# Patient Record
Sex: Female | Born: 1985
Health system: Southern US, Community
[De-identification: ages and names within clinical notes are randomized; demographics above are authoritative.]

## PROBLEM LIST (undated history)

## (undated) ENCOUNTER — Inpatient Hospital Stay (HOSPITAL_COMMUNITY): Payer: Self-pay

## (undated) DIAGNOSIS — M7989 Other specified soft tissue disorders: Secondary | ICD-10-CM

## (undated) DIAGNOSIS — F329 Major depressive disorder, single episode, unspecified: Secondary | ICD-10-CM

## (undated) DIAGNOSIS — F419 Anxiety disorder, unspecified: Secondary | ICD-10-CM

## (undated) DIAGNOSIS — E538 Deficiency of other specified B group vitamins: Secondary | ICD-10-CM

## (undated) DIAGNOSIS — R0602 Shortness of breath: Secondary | ICD-10-CM

## (undated) DIAGNOSIS — B009 Herpesviral infection, unspecified: Secondary | ICD-10-CM

## (undated) DIAGNOSIS — R600 Localized edema: Secondary | ICD-10-CM

## (undated) DIAGNOSIS — M549 Dorsalgia, unspecified: Secondary | ICD-10-CM

## (undated) DIAGNOSIS — D649 Anemia, unspecified: Secondary | ICD-10-CM

## (undated) DIAGNOSIS — E559 Vitamin D deficiency, unspecified: Secondary | ICD-10-CM

## (undated) DIAGNOSIS — G4730 Sleep apnea, unspecified: Secondary | ICD-10-CM

## (undated) DIAGNOSIS — M1990 Unspecified osteoarthritis, unspecified site: Secondary | ICD-10-CM

## (undated) DIAGNOSIS — I10 Essential (primary) hypertension: Secondary | ICD-10-CM

## (undated) DIAGNOSIS — O139 Gestational [pregnancy-induced] hypertension without significant proteinuria, unspecified trimester: Secondary | ICD-10-CM

## (undated) DIAGNOSIS — F988 Other specified behavioral and emotional disorders with onset usually occurring in childhood and adolescence: Secondary | ICD-10-CM

## (undated) DIAGNOSIS — Z332 Encounter for elective termination of pregnancy: Secondary | ICD-10-CM

## (undated) DIAGNOSIS — E669 Obesity, unspecified: Secondary | ICD-10-CM

## (undated) DIAGNOSIS — F32A Depression, unspecified: Secondary | ICD-10-CM

## (undated) DIAGNOSIS — K829 Disease of gallbladder, unspecified: Secondary | ICD-10-CM

## (undated) DIAGNOSIS — N390 Urinary tract infection, site not specified: Secondary | ICD-10-CM

## (undated) DIAGNOSIS — M2550 Pain in unspecified joint: Secondary | ICD-10-CM

## (undated) HISTORY — PX: APPENDECTOMY: SHX54

## (undated) HISTORY — DX: Anxiety disorder, unspecified: F41.9

## (undated) HISTORY — DX: Deficiency of other specified B group vitamins: E53.8

## (undated) HISTORY — DX: Anemia, unspecified: D64.9

## (undated) HISTORY — DX: Other specified behavioral and emotional disorders with onset usually occurring in childhood and adolescence: F98.8

## (undated) HISTORY — DX: Vitamin D deficiency, unspecified: E55.9

## (undated) HISTORY — DX: Depression, unspecified: F32.A

## (undated) HISTORY — DX: Dorsalgia, unspecified: M54.9

## (undated) HISTORY — DX: Localized edema: R60.0

## (undated) HISTORY — DX: Disease of gallbladder, unspecified: K82.9

## (undated) HISTORY — DX: Obesity, unspecified: E66.9

## (undated) HISTORY — DX: Pain in unspecified joint: M25.50

## (undated) HISTORY — PX: OTHER SURGICAL HISTORY: SHX169

## (undated) HISTORY — DX: Other specified soft tissue disorders: M79.89

## (undated) HISTORY — PX: COSMETIC SURGERY: SHX468

## (undated) HISTORY — DX: Shortness of breath: R06.02

## (undated) HISTORY — DX: Gestational (pregnancy-induced) hypertension without significant proteinuria, unspecified trimester: O13.9

## (undated) HISTORY — DX: Unspecified osteoarthritis, unspecified site: M19.90

---
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
## 2007-03-27 ENCOUNTER — Other Ambulatory Visit: Admission: RE | Admit: 2007-03-27 | Discharge: 2007-03-27 | Payer: Self-pay | Admitting: Obstetrics and Gynecology

## 2007-05-13 ENCOUNTER — Ambulatory Visit (HOSPITAL_COMMUNITY): Admission: RE | Admit: 2007-05-13 | Discharge: 2007-05-13 | Payer: Self-pay | Admitting: Obstetrics and Gynecology

## 2007-05-21 ENCOUNTER — Ambulatory Visit (HOSPITAL_COMMUNITY): Admission: RE | Admit: 2007-05-21 | Discharge: 2007-05-21 | Payer: Self-pay | Admitting: Obstetrics and Gynecology

## 2007-07-23 ENCOUNTER — Other Ambulatory Visit: Admission: RE | Admit: 2007-07-23 | Discharge: 2007-07-23 | Payer: Self-pay | Admitting: Obstetrics and Gynecology

## 2007-08-22 ENCOUNTER — Ambulatory Visit (HOSPITAL_COMMUNITY): Admission: RE | Admit: 2007-08-22 | Discharge: 2007-08-22 | Payer: Self-pay | Admitting: Obstetrics and Gynecology

## 2007-09-19 ENCOUNTER — Inpatient Hospital Stay (HOSPITAL_COMMUNITY): Admission: AD | Admit: 2007-09-19 | Discharge: 2007-09-19 | Payer: Self-pay | Admitting: Obstetrics and Gynecology

## 2007-09-25 ENCOUNTER — Inpatient Hospital Stay (HOSPITAL_COMMUNITY): Admission: AD | Admit: 2007-09-25 | Discharge: 2007-09-26 | Payer: Self-pay | Admitting: Obstetrics and Gynecology

## 2007-09-27 ENCOUNTER — Ambulatory Visit (HOSPITAL_COMMUNITY): Admission: RE | Admit: 2007-09-27 | Discharge: 2007-09-27 | Payer: Self-pay | Admitting: Obstetrics and Gynecology

## 2007-10-10 ENCOUNTER — Ambulatory Visit (HOSPITAL_COMMUNITY): Admission: RE | Admit: 2007-10-10 | Discharge: 2007-10-10 | Payer: Self-pay | Admitting: Obstetrics and Gynecology

## 2007-10-18 ENCOUNTER — Inpatient Hospital Stay (HOSPITAL_COMMUNITY): Admission: AD | Admit: 2007-10-18 | Discharge: 2007-10-18 | Payer: Self-pay | Admitting: Obstetrics and Gynecology

## 2007-10-19 ENCOUNTER — Inpatient Hospital Stay (HOSPITAL_COMMUNITY): Admission: AD | Admit: 2007-10-19 | Discharge: 2007-10-19 | Payer: Self-pay | Admitting: Obstetrics and Gynecology

## 2007-10-20 ENCOUNTER — Inpatient Hospital Stay (HOSPITAL_COMMUNITY): Admission: AD | Admit: 2007-10-20 | Discharge: 2007-10-20 | Payer: Self-pay | Admitting: Obstetrics and Gynecology

## 2007-10-21 ENCOUNTER — Inpatient Hospital Stay (HOSPITAL_COMMUNITY): Admission: AD | Admit: 2007-10-21 | Discharge: 2007-10-24 | Payer: Self-pay | Admitting: Obstetrics and Gynecology

## 2010-01-19 ENCOUNTER — Ambulatory Visit (HOSPITAL_COMMUNITY): Admission: RE | Admit: 2010-01-19 | Discharge: 2010-01-19 | Payer: Self-pay | Admitting: Obstetrics and Gynecology

## 2010-02-16 ENCOUNTER — Ambulatory Visit (HOSPITAL_COMMUNITY): Admission: RE | Admit: 2010-02-16 | Discharge: 2010-02-16 | Payer: Self-pay | Admitting: Obstetrics and Gynecology

## 2010-07-28 ENCOUNTER — Inpatient Hospital Stay (HOSPITAL_COMMUNITY): Admission: AD | Admit: 2010-07-28 | Discharge: 2010-07-28 | Payer: Self-pay | Admitting: Obstetrics

## 2010-07-28 ENCOUNTER — Ambulatory Visit: Payer: Self-pay | Admitting: Nurse Practitioner

## 2010-07-28 DIAGNOSIS — O4100X0 Oligohydramnios, unspecified trimester, not applicable or unspecified: Secondary | ICD-10-CM

## 2010-07-30 ENCOUNTER — Inpatient Hospital Stay (HOSPITAL_COMMUNITY): Admission: AD | Admit: 2010-07-30 | Discharge: 2010-07-30 | Payer: Self-pay | Admitting: Obstetrics and Gynecology

## 2010-07-31 ENCOUNTER — Inpatient Hospital Stay (HOSPITAL_COMMUNITY): Admission: AD | Admit: 2010-07-31 | Discharge: 2010-08-03 | Payer: Self-pay | Admitting: Obstetrics and Gynecology

## 2010-08-08 ENCOUNTER — Inpatient Hospital Stay (HOSPITAL_COMMUNITY): Admission: AD | Admit: 2010-08-08 | Discharge: 2010-08-10 | Payer: Self-pay | Admitting: Obstetrics & Gynecology

## 2010-08-09 ENCOUNTER — Encounter (INDEPENDENT_AMBULATORY_CARE_PROVIDER_SITE_OTHER): Payer: Self-pay | Admitting: Obstetrics and Gynecology

## 2010-09-03 ENCOUNTER — Emergency Department (HOSPITAL_COMMUNITY)
Admission: EM | Admit: 2010-09-03 | Discharge: 2010-09-03 | Payer: Self-pay | Source: Home / Self Care | Admitting: Emergency Medicine

## 2010-10-23 ENCOUNTER — Encounter: Payer: Self-pay | Admitting: Obstetrics and Gynecology

## 2010-12-13 LAB — DIFFERENTIAL
Basophils Absolute: 0 10*3/uL (ref 0.0–0.1)
Basophils Absolute: 0 10*3/uL (ref 0.0–0.1)
Basophils Relative: 0 % (ref 0–1)
Basophils Relative: 0 % (ref 0–1)
Eosinophils Absolute: 0.2 10*3/uL (ref 0.0–0.7)
Eosinophils Absolute: 0.3 10*3/uL (ref 0.0–0.7)
Eosinophils Relative: 2 % (ref 0–5)
Eosinophils Relative: 3 % (ref 0–5)
Lymphocytes Relative: 22 % (ref 12–46)
Lymphs Abs: 2.6 10*3/uL (ref 0.7–4.0)
Monocytes Absolute: 0.9 10*3/uL (ref 0.1–1.0)
Monocytes Absolute: 0.9 10*3/uL (ref 0.1–1.0)
Monocytes Relative: 8 % (ref 3–12)
Monocytes Relative: 9 % (ref 3–12)
Neutro Abs: 8 10*3/uL — ABNORMAL HIGH (ref 1.7–7.7)
Neutro Abs: 6.8 10*3/uL (ref 1.7–7.7)
Neutrophils Relative %: 68 % (ref 43–77)

## 2010-12-13 LAB — CBC
HCT: 39.9 % (ref 36.0–46.0)
HCT: 32 % — ABNORMAL LOW (ref 36.0–46.0)
HCT: 34.8 % — ABNORMAL LOW (ref 36.0–46.0)
Hemoglobin: 12.8 g/dL (ref 12.0–15.0)
Hemoglobin: 11 g/dL — ABNORMAL LOW (ref 12.0–15.0)
MCH: 29.7 pg (ref 26.0–34.0)
MCH: 30.5 pg (ref 26.0–34.0)
MCH: 31.9 pg (ref 26.0–34.0)
MCHC: 32.1 g/dL (ref 30.0–36.0)
MCHC: 32.8 g/dL (ref 30.0–36.0)
MCHC: 34.5 g/dL (ref 30.0–36.0)
MCV: 92.6 fL (ref 78.0–100.0)
MCV: 92.4 fL (ref 78.0–100.0)
MCV: 92.5 fL (ref 78.0–100.0)
Platelets: 293 10*3/uL (ref 150–400)
Platelets: 246 10*3/uL (ref 150–400)
Platelets: 330 10*3/uL (ref 150–400)
Platelets: 364 10*3/uL (ref 150–400)
RBC: 4.31 MIL/uL (ref 3.87–5.11)
RBC: 3.46 MIL/uL — ABNORMAL LOW (ref 3.87–5.11)
RBC: 3.76 MIL/uL — ABNORMAL LOW (ref 3.87–5.11)
RDW: 15.2 % (ref 11.5–15.5)
RDW: 14.4 % (ref 11.5–15.5)
RDW: 14.9 % (ref 11.5–15.5)
WBC: 11.8 10*3/uL — ABNORMAL HIGH (ref 4.0–10.5)
WBC: 10 10*3/uL (ref 4.0–10.5)
WBC: 16.2 10*3/uL — ABNORMAL HIGH (ref 4.0–10.5)

## 2010-12-13 LAB — COMPREHENSIVE METABOLIC PANEL
ALT: 39 U/L — ABNORMAL HIGH (ref 0–35)
ALT: 35 U/L (ref 0–35)
AST: 52 U/L — ABNORMAL HIGH (ref 0–37)
AST: 24 U/L (ref 0–37)
Albumin: 3.5 g/dL (ref 3.5–5.2)
Albumin: 2.8 g/dL — ABNORMAL LOW (ref 3.5–5.2)
Albumin: 2.9 g/dL — ABNORMAL LOW (ref 3.5–5.2)
Alkaline Phosphatase: 86 U/L (ref 39–117)
Alkaline Phosphatase: 86 U/L (ref 39–117)
BUN: 9 mg/dL (ref 6–23)
BUN: 11 mg/dL (ref 6–23)
BUN: 9 mg/dL (ref 6–23)
CO2: 27 mEq/L (ref 19–32)
Calcium: 8.8 mg/dL (ref 8.4–10.5)
Calcium: 8.7 mg/dL (ref 8.4–10.5)
Chloride: 105 mEq/L (ref 96–112)
Chloride: 107 mEq/L (ref 96–112)
Creatinine, Ser: 0.84 mg/dL (ref 0.4–1.2)
Creatinine, Ser: 0.66 mg/dL (ref 0.4–1.2)
GFR calc Af Amer: >60 mL/min (ref >60)
GFR calc Af Amer: >60 mL/min (ref >60)
GFR calc non Af Amer: >60 mL/min (ref >60)
Glucose, Bld: 80 mg/dL (ref 70–99)
Potassium: 4.7 mEq/L (ref 3.5–5.1)
Potassium: 4.2 mEq/L (ref 3.5–5.1)
Sodium: 137 mEq/L (ref 135–145)
Sodium: 136 mEq/L (ref 135–145)
Total Bilirubin: 0.3 mg/dL (ref 0.3–1.2)
Total Bilirubin: 0.4 mg/dL (ref 0.3–1.2)
Total Protein: 7.4 g/dL (ref 6.0–8.3)
Total Protein: 6.8 g/dL (ref 6.0–8.3)

## 2010-12-13 LAB — URINALYSIS, ROUTINE W REFLEX MICROSCOPIC
Bilirubin Urine: NEGATIVE
Glucose, UA: NEGATIVE mg/dL
Hgb urine dipstick: NEGATIVE
Ketones, ur: NEGATIVE mg/dL
Nitrite: NEGATIVE
Protein, ur: NEGATIVE mg/dL
Specific Gravity, Urine: 1.012 (ref 1.005–1.030)
Urobilinogen, UA: 0.2 mg/dL (ref 0.0–1.0)
pH: 8 (ref 5.0–8.0)

## 2010-12-13 LAB — POCT I-STAT, CHEM 8
BUN: 10 mg/dL (ref 6–23)
Calcium, Ion: 1.09 mmol/L — ABNORMAL LOW (ref 1.12–1.32)
Chloride: 107 mEq/L (ref 96–112)
Creatinine, Ser: 0.9 mg/dL (ref 0.4–1.2)
Glucose, Bld: 80 mg/dL (ref 70–99)
HCT: 41 % (ref 36.0–46.0)
Hemoglobin: 13.9 g/dL (ref 12.0–15.0)
Potassium: 4.8 mEq/L (ref 3.5–5.1)
Sodium: 138 mEq/L (ref 135–145)
TCO2: 27 mmol/L (ref 0–100)

## 2010-12-13 LAB — BASIC METABOLIC PANEL
BUN: 9 mg/dL (ref 6–23)
CO2: 25 mEq/L (ref 19–32)
Calcium: 8.2 mg/dL — ABNORMAL LOW (ref 8.4–10.5)
Chloride: 104 mEq/L (ref 96–112)
Creatinine, Ser: 0.68 mg/dL (ref 0.4–1.2)
GFR calc Af Amer: >60 mL/min (ref >60)
Glucose, Bld: 84 mg/dL (ref 70–99)

## 2010-12-13 LAB — LIPASE, BLOOD: Lipase: 23 U/L (ref 11–59)

## 2010-12-13 LAB — MRSA PCR SCREENING: MRSA by PCR: NEGATIVE

## 2010-12-14 LAB — CBC
HCT: 35.4 % — ABNORMAL LOW (ref 36.0–46.0)
MCH: 31.1 pg (ref 26.0–34.0)
MCHC: 34 g/dL (ref 30.0–36.0)
MCV: 91.5 fL (ref 78.0–100.0)
Platelets: 285 10*3/uL (ref 150–400)
RDW: 14.7 % (ref 11.5–15.5)
WBC: 12.1 10*3/uL — ABNORMAL HIGH (ref 4.0–10.5)

## 2011-06-22 LAB — CBC
HCT: 32.8 — ABNORMAL LOW
HCT: 35.7 — ABNORMAL LOW
Hemoglobin: 11.4 — ABNORMAL LOW
MCHC: 34.1
MCHC: 34.6
MCHC: 34.8
MCV: 92
MCV: 92.2
Platelets: 252
Platelets: 270
RBC: 3.87
RDW: 14.9
RDW: 15.1
WBC: 14.5 — ABNORMAL HIGH

## 2011-06-22 LAB — URINALYSIS, ROUTINE W REFLEX MICROSCOPIC
Glucose, UA: NEGATIVE
Hgb urine dipstick: NEGATIVE
Ketones, ur: 15 — AB
Leukocytes, UA: NEGATIVE
Protein, ur: 30 — AB
pH: 6

## 2011-06-22 LAB — COMPREHENSIVE METABOLIC PANEL
ALT: 13
AST: 17
Albumin: 2.6 — ABNORMAL LOW
Calcium: 8.9
Chloride: 105
Creatinine, Ser: 0.68
GFR calc Af Amer: >60
Sodium: 133 — ABNORMAL LOW
Total Bilirubin: 0.5

## 2011-06-22 LAB — URINE MICROSCOPIC-ADD ON

## 2011-07-07 LAB — URINALYSIS, ROUTINE W REFLEX MICROSCOPIC
Hgb urine dipstick: NEGATIVE
Nitrite: NEGATIVE
Protein, ur: NEGATIVE
Urobilinogen, UA: 0.2

## 2011-07-07 LAB — URINE MICROSCOPIC-ADD ON

## 2011-07-07 LAB — COMPREHENSIVE METABOLIC PANEL
ALT: 16
Calcium: 8.8
Creatinine, Ser: 0.65
GFR calc non Af Amer: >60
Glucose, Bld: 101 — ABNORMAL HIGH
Sodium: 135
Total Protein: 6.1

## 2011-07-07 LAB — CBC
Hemoglobin: 11.5 — ABNORMAL LOW
MCHC: 34.5
MCV: 92.2
RDW: 14.5

## 2011-07-07 LAB — LACTATE DEHYDROGENASE: LDH: 113

## 2011-07-07 LAB — URIC ACID: Uric Acid, Serum: 4

## 2011-09-23 ENCOUNTER — Emergency Department (HOSPITAL_COMMUNITY): Admission: EM | Admit: 2011-09-23 | Discharge: 2011-09-23 | Payer: Self-pay

## 2013-04-23 ENCOUNTER — Ambulatory Visit: Payer: Self-pay | Admitting: Family Medicine

## 2013-04-23 VITALS — BP 124/70 | HR 88 | Temp 98.2°F | Resp 16 | Ht 71.0 in | Wt 352.0 lb

## 2013-04-23 DIAGNOSIS — N898 Other specified noninflammatory disorders of vagina: Secondary | ICD-10-CM

## 2013-04-23 DIAGNOSIS — Z202 Contact with and (suspected) exposure to infections with a predominantly sexual mode of transmission: Secondary | ICD-10-CM

## 2013-04-23 LAB — POCT URINALYSIS DIPSTICK
Ketones, UA: NEGATIVE
Leukocytes, UA: NEGATIVE
Nitrite, UA: NEGATIVE
Protein, UA: NEGATIVE
Urobilinogen, UA: 0.2
pH, UA: 5.5

## 2013-04-23 LAB — POCT WET PREP WITH KOH
KOH Prep POC: NEGATIVE
Trichomonas, UA: NEGATIVE

## 2013-04-23 LAB — POCT URINE PREGNANCY: Preg Test, Ur: NEGATIVE

## 2013-04-23 MED ORDER — METRONIDAZOLE 500 MG PO TABS: 500.0000 mg | ORAL_TABLET | Freq: Two times a day (BID) | ORAL | Status: DC

## 2013-04-23 MED ORDER — CEFIXIME 400 MG PO TABS: 400.0000 mg | ORAL_TABLET | Freq: Once | ORAL | Status: DC

## 2013-04-23 MED ORDER — AZITHROMYCIN 500 MG PO TABS: 1000.0000 mg | ORAL_TABLET | Freq: Once | ORAL | Status: DC

## 2013-04-23 NOTE — Progress Notes (Signed)
Subjective:    Patient ID: Carrie Shaw, female    DOB: 15-Dec-1985, 27 y.o.   MRN: [id number]   Chief Complaint  Patient presents with  . Abdominal Pain    * 3 days (period was late-took morning after pill 2 weeks ago)  . vaginal odor   HPI  Has been having stomach cramps - feels like uterine contractions during labor (but of course more mild -  4/10 on pain scale) - for the past 2d.  Also has a white, thin discharge with fishy odor.  Has tried rePhresh and douched to get rid of odor but w/o success.  Had unprotected sex on 6/28 and took the morning pill about 12 hrs after - the next morning.  Usually periods comes on the 2nd of each mo - period is always very regular. However, her period did not come on 7/2 as expected but actually came around the 7th - 5d later than normal.  Then took the pregnancy test around 7/12-13th which was negative.    Has had BV in the past - which frequently happens after unprotected sex.  Is in a monogamous relationship.  Requests a rx for the "new medicine" for weight loss - the "one on all the commercials."  Has two kids. last yr she had an abortion. History reviewed. No pertinent past medical history. No current outpatient prescriptions on file prior to visit.   No current facility-administered medications on file prior to visit.   No Known Allergies  Review of Systems  Constitutional: Positive for unexpected weight change. Negative for fever, chills, diaphoresis, activity change, appetite change and fatigue.  Gastrointestinal: Positive for abdominal pain. Negative for nausea, vomiting, blood in stool and anal bleeding.  Genitourinary: Positive for frequency, vaginal discharge, menstrual problem, pelvic pain and dyspareunia. Negative for dysuria, urgency, hematuria, decreased urine volume, vaginal bleeding, difficulty urinating, genital sores and vaginal pain.  Skin: Negative for rash.  Hematological: Negative for adenopathy.      BP 124/70   Pulse 88  Temp(Src) 98.2 F (36.8 C) (Oral)  Resp 16  Ht 5\' 11"  (1.803 m)  Wt 352 lb (159.666 kg)  BMI 49.12 kg/m2  SpO2 99%  LMP 04/09/2013  Objective:   Physical Exam  Constitutional: She is oriented to person, place, and time. She appears well-developed and well-nourished. No distress.  HENT:  Head: Normocephalic and atraumatic.  Cardiovascular: Intact distal pulses.   Pulmonary/Chest: Effort normal.  Abdominal: Soft. Bowel sounds are normal. She exhibits no distension. There is no tenderness. There is no rebound and no guarding.  Genitourinary: Pelvic exam was performed with patient supine. There is no rash, tenderness or lesion on the right labia. There is no rash, tenderness or lesion on the left labia. Uterus is tender. Cervix exhibits no motion tenderness, no discharge and no friability. Right adnexum displays no mass, no tenderness and no fullness. Left adnexum displays no mass, no tenderness and no fullness. No erythema or tenderness around the vagina. Vaginal discharge found.  Small amount of clear thin discharge and a little white clumpy discharge.  Lymphadenopathy:       Right: No inguinal adenopathy present.       Left: No inguinal adenopathy present.  Neurological: She is alert and oriented to person, place, and time.  Skin: Skin is warm and dry. She is not diaphoretic.  Psychiatric: She has a normal mood and affect. Her behavior is normal.      Results for orders placed in visit on 04/23/13  POCT URINE PREGNANCY      Result Value Range   Preg Test, Ur Negative    POCT URINALYSIS DIPSTICK      Result Value Range   Color, UA yellow     Clarity, UA clear     Glucose, UA neg     Bilirubin, UA neg     Ketones, UA neg     Spec Grav, UA 1.020     Blood, UA neg     pH, UA 5.5     Protein, UA neg     Urobilinogen, UA 0.2     Nitrite, UA neg     Leukocytes, UA Negative    POCT WET PREP WITH KOH      Result Value Range   Trichomonas, UA Negative     Clue Cells Wet  Prep HPF POC 1-2     Epithelial Wet Prep HPF POC 4-8     Yeast Wet Prep HPF POC neg     Bacteria Wet Prep HPF POC 3+     RBC Wet Prep HPF POC neg     WBC Wet Prep HPF POC 0-5     KOH Prep POC Negative      Assessment & Plan:  Vaginal discharge - Plan: POCT urine pregnancy, POCT urinalysis dipstick, POCT Wet Prep with KOH, GC/Chlamydia Probe Amp.  Pt does have amine odor of BV - wet prep may be falsely negative due to recent use of rePhresh and douching. Due to recurrent sxs and odor will go ahead and have pt start trx for BV w/ flagyl tonight - hopefully this will help uterine pain.  Possible exposure to STD - Plan: GC/Chlamydia Probe Amp Pt does not have health insurance so offered to treat emperically for GC/Chl but pt definitely wants to know if she does have these infections since she believes she is in a monogamous relationship.  Printed rx for cefixime and azithro but pt will not fill until tests come back unless suprapubic cramping pain worsens - then go ahead and start.  Morbid obesity - risks and poor results of most "weight loss" meds reviewed. Rec diet and exercise. Meds ordered this encounter  Medications  . azithromycin (ZITHROMAX) 500 MG tablet    Sig: Take 2 tablets (1,000 mg total) by mouth once.    Dispense:  2 tablet    Refill:  0  . cefixime (SUPRAX) 400 MG tablet    Sig: Take 1 tablet (400 mg total) by mouth once.    Dispense:  1 tablet    Refill:  0  . metroNIDAZOLE (FLAGYL) 500 MG tablet    Sig: Take 1 tablet (500 mg total) by mouth 2 (two) times daily with a meal. DO NOT CONSUME ALCOHOL WHILE TAKING THIS MEDICATION.    Dispense:  14 tablet    Refill:  0

## 2013-04-23 NOTE — Patient Instructions (Addendum)
Make an appointment at either the [facility] Dept or the [facility] and [facility] for full STD testing ASAP!!!!

## 2013-04-25 LAB — GC/CHLAMYDIA PROBE AMP: CT Probe RNA: NEGATIVE

## 2013-04-28 ENCOUNTER — Telehealth: Payer: Self-pay

## 2013-04-28 NOTE — Telephone Encounter (Signed)
Patient is calling about lab results call [phone]

## 2013-04-28 NOTE — Telephone Encounter (Signed)
Called patient to advise on lab results.    Value   CT Probe RNA NEGATIVE    GC Probe RNA NEGATIVE     Left message for her to call back

## 2013-04-28 NOTE — Telephone Encounter (Signed)
Patient advised.

## 2013-05-06 ENCOUNTER — Encounter: Payer: Self-pay | Admitting: Family Medicine

## 2013-08-31 ENCOUNTER — Telehealth: Payer: Self-pay

## 2013-08-31 MED ORDER — METRONIDAZOLE 500 MG PO TABS: 500.0000 mg | ORAL_TABLET | Freq: Two times a day (BID) | ORAL | Status: DC

## 2013-08-31 NOTE — Telephone Encounter (Signed)
Patient would like to know if we can rx same medication she received last time for vaginal odor. She does not have insurance and cannot afford to come in and she is having the same symptoms as last time.

## 2013-08-31 NOTE — Telephone Encounter (Signed)
I sent the flagyl to the pharmacy but if this reoccurs she must have an OV because at her last visit she did not have a definitive infection.

## 2013-09-01 NOTE — Telephone Encounter (Signed)
Called her, to advise. Left message to advise Rx sent return if not better.

## 2013-10-16 ENCOUNTER — Ambulatory Visit: Payer: No Typology Code available for payment source | Admitting: Emergency Medicine

## 2013-10-16 VITALS — BP 124/80 | HR 61 | Temp 98.0°F | Resp 16 | Ht 71.5 in | Wt 356.0 lb

## 2013-10-16 DIAGNOSIS — Z23 Encounter for immunization: Secondary | ICD-10-CM

## 2013-10-16 NOTE — Progress Notes (Signed)
[facility] [address], [place] [address] [phone]  Date:  10/16/2013   Name:  Carrie Shaw   DOB:  05/26/1986   MRN:  [id number]  PCP:  No PCP Per Patient    Chief Complaint: Immunizations   History of Present Illness:  Carrie Shaw is a 28 y.o. very pleasant female patient who presents with the following:  For school required tetanus booster.  Denies other complaint or health concern today.   Patient Active Problem List   Diagnosis Date Noted  . Morbid obesity 04/23/2013    History reviewed. No pertinent past medical history.  History reviewed. No pertinent past surgical history.  History  Substance Use Topics  . Smoking status: Current Every Day Smoker -- 5.00 packs/day for 10 years    Types: Cigarettes  . Smokeless tobacco: Not on file  . Alcohol Use: Yes    Family History  Problem Relation Age of Onset  . Schizophrenia Sister     No Known Allergies  Medication list has been reviewed and updated.  No current outpatient prescriptions on file prior to visit.   No current facility-administered medications on file prior to visit.    Review of Systems:  As per HPI, otherwise negative.     Physical Examination: Filed Vitals:   10/16/13 0844  BP: 124/80  Pulse: 61  Temp: 98 F (36.7 C)  Resp: 16   Filed Vitals:   10/16/13 0844  Height: 5' 11.5" (1.816 m)  Weight: 356 lb (161.481 kg)   Body mass index is 48.97 kg/(m^2). Ideal Body Weight: Weight in (lb) to have BMI = 25: 181.4   GEN: WDWN, NAD, Non-toxic, Alert & Oriented x 3 HEENT: Atraumatic, Normocephalic.  Ears and Nose: No external deformity. EXTR: No clubbing/cyanosis/edema NEURO: Normal gait.  PSYCH: Normally interactive. Conversant. Not depressed or anxious appearing.  Calm demeanor.    Assessment and Plan: TDAP  Signed,  Phillips OdorJeffery Yoanna Jurczyk, MD

## 2013-11-01 ENCOUNTER — Ambulatory Visit: Payer: No Typology Code available for payment source | Admitting: Family Medicine

## 2013-11-01 VITALS — BP 140/80 | HR 82 | Temp 98.1°F | Resp 16 | Ht 71.0 in | Wt 347.2 lb

## 2013-11-01 DIAGNOSIS — N912 Amenorrhea, unspecified: Secondary | ICD-10-CM

## 2013-11-01 LAB — POCT URINE PREGNANCY: Preg Test, Ur: NEGATIVE

## 2013-11-01 NOTE — Progress Notes (Signed)
° °  Subjective:    Patient ID: Carrie Shaw, female    DOB: 05/18/1986, 28 y.o.   MRN: [id number]  HPI This chart was scribed for Carrie SidleKurt Lauenstein, MD by Andrew Auaven Small, ED Scribe. This patient was seen in room 5 and the patient's care was started at 7:02 PM.  HPI Comments: Carrie Shaw is a 28 y.o. female who [facility] a pregnancy test. Pt states that she has missed her period and that her last regular period was 1 month ago. She states that she has never skipped a period. She states she is afraid. She reports that she doesn't not want to be pregnant. She reports that early this month she has had breast tenderness.   Pt works for [employer]  No past medical history on file. No Known Allergies Prior to Admission medications   Not on File    Review of Systems  Constitutional: Negative for fever and chills.  Gastrointestinal: Negative for nausea.       Objective:   Physical Exam  Nursing note and vitals reviewed. Constitutional: She is oriented to person, place, and time. She appears well-developed and well-nourished. No distress.  HENT:  Head: Normocephalic and atraumatic.  Eyes: EOM are normal.  Neck: Neck supple. No tracheal deviation present.  Cardiovascular: Normal rate.   Pulmonary/Chest: Effort normal. No respiratory distress.  Musculoskeletal: Normal range of motion.  Neurological: She is alert and oriented to person, place, and time.  Skin: Skin is warm and dry.  Psychiatric: She has a normal mood and affect. Her behavior is normal.   Filed Vitals:   11/01/13 1833  BP: 140/80  Pulse: 82  Temp: 98.1 F (36.7 C)  Resp: 16   Results for orders placed in visit on 11/01/13  POCT URINE PREGNANCY      Result Value Range   Preg Test, Ur Negative        Assessment & Plan:   Amenorrhea - Plan: POCT urine pregnancy, hCG, serum, qualitative  Signed, Carrie SidleKurt Lauenstein, MD

## 2013-11-02 ENCOUNTER — Telehealth: Payer: Self-pay

## 2013-11-02 LAB — HCG, SERUM, QUALITATIVE: Preg, Serum: NEGATIVE

## 2013-11-02 NOTE — Telephone Encounter (Signed)
They are not back yet. It looks like Carrie Shaw told her that we would call her when we got them back

## 2013-11-02 NOTE — Telephone Encounter (Signed)
Patient is calling to get her pregnancy lab results. Please advise. I told her they were not ready yet since it was collected after 7 pm last night.  I told her our lab would call when ready.  Best: [phone]

## 2013-11-03 ENCOUNTER — Other Ambulatory Visit: Payer: Self-pay | Admitting: Family Medicine

## 2013-11-03 ENCOUNTER — Telehealth: Payer: Self-pay

## 2013-11-03 DIAGNOSIS — N912 Amenorrhea, unspecified: Secondary | ICD-10-CM

## 2013-11-03 MED ORDER — MEDROXYPROGESTERONE ACETATE 10 MG PO TABS: 10.0000 mg | ORAL_TABLET | Freq: Every day | ORAL | Status: DC

## 2013-11-03 NOTE — Telephone Encounter (Signed)
Patient called about labs and was advised.  Patient states Dr. Milus GlazierLauenstein was going to give her Rx for her cycle to start.

## 2013-11-03 NOTE — Telephone Encounter (Signed)
Provera 10 mg qhs x 10 days has been called in.  Expect period 7 days after completing the medication

## 2013-11-04 NOTE — Telephone Encounter (Signed)
Patient notified and voiced understanding.

## 2014-01-09 ENCOUNTER — Ambulatory Visit (INDEPENDENT_AMBULATORY_CARE_PROVIDER_SITE_OTHER): Payer: No Typology Code available for payment source | Admitting: Family Medicine

## 2014-01-09 VITALS — BP 100/78 | HR 77 | Temp 98.3°F | Resp 16 | Ht 71.0 in | Wt 354.0 lb

## 2014-01-09 DIAGNOSIS — N949 Unspecified condition associated with female genital organs and menstrual cycle: Secondary | ICD-10-CM

## 2014-01-09 DIAGNOSIS — B9689 Other specified bacterial agents as the cause of diseases classified elsewhere: Secondary | ICD-10-CM

## 2014-01-09 DIAGNOSIS — N898 Other specified noninflammatory disorders of vagina: Secondary | ICD-10-CM

## 2014-01-09 DIAGNOSIS — N760 Acute vaginitis: Secondary | ICD-10-CM

## 2014-01-09 LAB — POCT WET PREP WITH KOH
KOH Prep POC: NEGATIVE
RBC Wet Prep HPF POC: NEGATIVE
TRICHOMONAS UA: NEGATIVE
YEAST WET PREP PER HPF POC: NEGATIVE

## 2014-01-09 MED ORDER — METRONIDAZOLE 500 MG PO TABS: 500.0000 mg | ORAL_TABLET | Freq: Two times a day (BID) | ORAL | Status: DC

## 2014-01-09 NOTE — Patient Instructions (Signed)
We are going to treat you with flagyl for bacterial vaginosis.  This is a bacterial imbalance and is probably causing your odor. Do not drink any alcohol with this medication!  Also, I would suggest that you try non- latex condoms in case you are sensitive to this material.   Let me know if you are not better, I will follow-up with the rest of your labs

## 2014-01-09 NOTE — Progress Notes (Addendum)
[facility] [address] [place] [address] [phone]  Date:  01/09/2014   Name:  Carrie Shaw   DOB:  23-Aug-1986   MRN:  [id number]  PCP:  No PCP Per Patient    Chief Complaint: vaginal odor   History of Present Illness:  Carrie Shaw is a 28 y.o. very pleasant female patient who presents with the following:  Here today with vaginal odor. She has one regular partner and does not use condoms with this person.  She has another partner who she sees on occasion and she does use condoms then.  She had sex with this other person recently and did use a condom.  Often after these encounters she will have vaginal sx.  Wonders if she might be allergic to the latex condoms they use.   She notes vaginal discharge and odor. No itching.  No pain.  Otherwise she feels well.  LMP 3 weeks ago.  She does not feel there is any chance of pregnancy.  Generally healthy otherwise except for obesity.   Patient Active Problem List   Diagnosis Date Noted  . Morbid obesity 04/23/2013    History reviewed. No pertinent past medical history.  Past Surgical History  Procedure Laterality Date  . Appendectomy      History  Substance Use Topics  . Smoking status: Current Every Day Smoker -- 5.00 packs/day for 10 years    Types: Cigarettes  . Smokeless tobacco: Not on file  . Alcohol Use: Yes    Family History  Problem Relation Age of Onset  . Schizophrenia Sister     No Known Allergies  Medication list has been reviewed and updated.  Current Outpatient Prescriptions on File Prior to Visit  Medication Sig Dispense Refill  . medroxyPROGESTERone (PROVERA) 10 MG tablet Take 1 tablet (10 mg total) by mouth at bedtime.  10 tablet  0   No current facility-administered medications on file prior to visit.    Review of Systems:  As per HPI- otherwise negative.   Physical Examination: Filed Vitals:   01/09/14 1725  BP: 100/78  Pulse: 77  Temp: 98.3 F  (36.8 C)  Resp: 16   Filed Vitals:   01/09/14 1725  Height: 5\' 11"  (1.803 m)  Weight: 354 lb (160.573 kg)   Body mass index is 49.39 kg/(m^2). Ideal Body Weight: Weight in (lb) to have BMI = 25: 178.9  GEN: WDWN, NAD, Non-toxic, A & O x 3, obese, looks well HEENT: Atraumatic, Normocephalic. Neck supple. No masses, No LAD. Ears and Nose: No external deformity. CV: RRR, No M/G/R. No JVD. No thrill. No extra heart sounds. PULM: CTA B, no wheezes, crackles, rhonchi. No retractions. No resp. distress. No accessory muscle use. ABD: S, NT, ND, +BS. No rebound. No HSM. EXTR: No c/c/e NEURO Normal gait.  PSYCH: Normally interactive. Conversant. Not depressed or anxious appearing.  Calm demeanor.  Gu: normal exam except for thin white discharge.  No CMT, no adnexal tenderness or masses.  No vaginal lesions.    Results for orders placed in visit on 01/09/14  POCT WET PREP WITH KOH      Result Value Ref Range   Trichomonas, UA Negative     Clue Cells Wet Prep HPF POC 50%     Epithelial Wet Prep HPF POC 3-5     Yeast Wet Prep HPF POC neg     Bacteria Wet Prep HPF POC 2+     RBC  Wet Prep HPF POC neg     WBC Wet Prep HPF POC 0-1     KOH Prep POC Negative      Assessment and Plan: Vaginal odor - Plan: POCT Wet Prep with KOH, GC/Chlamydia Probe Amp, metroNIDAZOLE (FLAGYL) 500 MG tablet  Bacterial vaginosis  Treat for BV with flagyl.  She may be sensitive to the latex condoms that she is using.  Suggested that she use non- latex next time.   See patient instructions for more details.   Will plan further follow- up pending labs.   Signed Abbe AmsterdamJessica Copland, MD  4/11: called and LMOM.  genprobe negative Results for orders placed in visit on 01/09/14  GC/CHLAMYDIA PROBE AMP      Result Value Ref Range   CT Probe RNA NEGATIVE     GC Probe RNA NEGATIVE    POCT WET PREP WITH KOH      Result Value Ref Range   Trichomonas, UA Negative     Clue Cells Wet Prep HPF POC 50%     Epithelial  Wet Prep HPF POC 3-5     Yeast Wet Prep HPF POC neg     Bacteria Wet Prep HPF POC 2+     RBC Wet Prep HPF POC neg     WBC Wet Prep HPF POC 0-1     KOH Prep POC Negative

## 2014-01-10 LAB — GC/CHLAMYDIA PROBE AMP
CT PROBE, AMP APTIMA: NEGATIVE
GC Probe RNA: NEGATIVE

## 2014-12-25 ENCOUNTER — Ambulatory Visit (INDEPENDENT_AMBULATORY_CARE_PROVIDER_SITE_OTHER): Payer: BLUE CROSS/BLUE SHIELD | Admitting: Physician Assistant

## 2014-12-25 VITALS — BP 132/86 | HR 83 | Temp 98.1°F | Resp 18 | Ht 71.0 in | Wt 385.0 lb

## 2014-12-25 DIAGNOSIS — N926 Irregular menstruation, unspecified: Secondary | ICD-10-CM | POA: Diagnosis not present

## 2014-12-25 DIAGNOSIS — Z01419 Encounter for gynecological examination (general) (routine) without abnormal findings: Secondary | ICD-10-CM | POA: Diagnosis not present

## 2014-12-25 DIAGNOSIS — Z309 Encounter for contraceptive management, unspecified: Secondary | ICD-10-CM

## 2014-12-25 DIAGNOSIS — R635 Abnormal weight gain: Secondary | ICD-10-CM

## 2014-12-25 DIAGNOSIS — F39 Unspecified mood [affective] disorder: Secondary | ICD-10-CM

## 2014-12-25 DIAGNOSIS — Z13228 Encounter for screening for other metabolic disorders: Secondary | ICD-10-CM

## 2014-12-25 DIAGNOSIS — B373 Candidiasis of vulva and vagina: Secondary | ICD-10-CM | POA: Diagnosis not present

## 2014-12-25 DIAGNOSIS — N898 Other specified noninflammatory disorders of vagina: Secondary | ICD-10-CM | POA: Diagnosis not present

## 2014-12-25 DIAGNOSIS — Z113 Encounter for screening for infections with a predominantly sexual mode of transmission: Secondary | ICD-10-CM

## 2014-12-25 DIAGNOSIS — G4700 Insomnia, unspecified: Secondary | ICD-10-CM | POA: Diagnosis not present

## 2014-12-25 DIAGNOSIS — Z1329 Encounter for screening for other suspected endocrine disorder: Secondary | ICD-10-CM | POA: Diagnosis not present

## 2014-12-25 DIAGNOSIS — B3731 Acute candidiasis of vulva and vagina: Secondary | ICD-10-CM

## 2014-12-25 DIAGNOSIS — R454 Irritability and anger: Secondary | ICD-10-CM

## 2014-12-25 DIAGNOSIS — Z0000 Encounter for general adult medical examination without abnormal findings: Secondary | ICD-10-CM

## 2014-12-25 DIAGNOSIS — R4586 Emotional lability: Secondary | ICD-10-CM

## 2014-12-25 DIAGNOSIS — Z13220 Encounter for screening for lipoid disorders: Secondary | ICD-10-CM | POA: Diagnosis not present

## 2014-12-25 DIAGNOSIS — Z30011 Encounter for initial prescription of contraceptive pills: Secondary | ICD-10-CM

## 2014-12-25 LAB — CBC WITH DIFFERENTIAL/PLATELET
Basophils Absolute: 0 10*3/uL (ref 0.0–0.1)
Basophils Relative: 0 % (ref 0–1)
EOS ABS: 0.1 10*3/uL (ref 0.0–0.7)
EOS PCT: 1 % (ref 0–5)
HEMATOCRIT: 40.3 % (ref 36.0–46.0)
HEMOGLOBIN: 13.2 g/dL (ref 12.0–15.0)
LYMPHS ABS: 2.9 10*3/uL (ref 0.7–4.0)
LYMPHS PCT: 26 % (ref 12–46)
MCH: 29.7 pg (ref 26.0–34.0)
MCHC: 32.8 g/dL (ref 30.0–36.0)
MCV: 90.8 fL (ref 78.0–100.0)
MONO ABS: 0.6 10*3/uL (ref 0.1–1.0)
MONOS PCT: 5 % (ref 3–12)
MPV: 9.9 fL (ref 8.6–12.4)
Neutro Abs: 7.5 10*3/uL (ref 1.7–7.7)
Neutrophils Relative %: 68 % (ref 43–77)
Platelets: 332 10*3/uL (ref 150–400)
RBC: 4.44 MIL/uL (ref 3.87–5.11)
RDW: 14.6 % (ref 11.5–15.5)
WBC: 11 10*3/uL — ABNORMAL HIGH (ref 4.0–10.5)

## 2014-12-25 LAB — POCT URINALYSIS DIPSTICK
Bilirubin, UA: NEGATIVE
Blood, UA: NEGATIVE
Glucose, UA: NEGATIVE
KETONES UA: NEGATIVE
Nitrite, UA: NEGATIVE
PH UA: 5.5
PROTEIN UA: NEGATIVE
SPEC GRAV UA: 1.015
UROBILINOGEN UA: 0.2

## 2014-12-25 LAB — POCT WET PREP WITH KOH
CLUE CELLS WET PREP PER HPF POC: 1.3
KOH Prep POC: POSITIVE
RBC Wet Prep HPF POC: NEGATIVE
TRICHOMONAS UA: NEGATIVE

## 2014-12-25 LAB — COMPLETE METABOLIC PANEL WITH GFR
ALK PHOS: 56 U/L (ref 39–117)
ALT: 36 U/L — AB (ref 0–35)
AST: 44 U/L — AB (ref 0–37)
Albumin: 3.8 g/dL (ref 3.5–5.2)
BUN: 10 mg/dL (ref 6–23)
CALCIUM: 9.2 mg/dL (ref 8.4–10.5)
CHLORIDE: 106 meq/L (ref 96–112)
CO2: 24 mEq/L (ref 19–32)
CREATININE: 0.7 mg/dL (ref 0.50–1.10)
GFR, Est African American: >89 mL/min
GFR, Est Non African American: >89 mL/min
Glucose, Bld: 65 mg/dL — ABNORMAL LOW (ref 70–99)
POTASSIUM: 4.9 meq/L (ref 3.5–5.3)
Sodium: 139 mEq/L (ref 135–145)
Total Bilirubin: 0.4 mg/dL (ref 0.2–1.2)
Total Protein: 7.7 g/dL (ref 6.0–8.3)

## 2014-12-25 LAB — LIPID PANEL
CHOL/HDL RATIO: 3.4 ratio
Cholesterol: 147 mg/dL (ref 0–200)
HDL: 43 mg/dL — AB (ref >=46)
LDL CALC: 93 mg/dL (ref 0–99)
Triglycerides: 57 mg/dL (ref <150)
VLDL: 11 mg/dL (ref 0–40)

## 2014-12-25 LAB — GLUCOSE, POCT (MANUAL RESULT ENTRY): POC Glucose: 82 mg/dl (ref 70–99)

## 2014-12-25 LAB — POCT URINE PREGNANCY: PREG TEST UR: NEGATIVE

## 2014-12-25 LAB — TSH: TSH: 1.317 u[IU]/mL (ref 0.350–4.500)

## 2014-12-25 MED ORDER — LEVONORGESTREL-ETHINYL ESTRAD 0.1-20 MG-MCG PO TABS: 1.0000 | ORAL_TABLET | Freq: Every day | ORAL | Status: DC

## 2014-12-25 MED ORDER — FLUCONAZOLE 150 MG PO TABS
150.0000 mg | ORAL_TABLET | Freq: Once | ORAL | Status: DC
Start: 2014-12-25 — End: 2015-02-12

## 2014-12-25 MED ORDER — TRAZODONE HCL 50 MG PO TABS: 25.0000 mg | ORAL_TABLET | Freq: Every evening | ORAL | Status: DC | PRN

## 2014-12-25 MED ORDER — ESCITALOPRAM OXALATE 10 MG PO TABS: 10.0000 mg | ORAL_TABLET | Freq: Every day | ORAL | Status: DC

## 2014-12-25 NOTE — Progress Notes (Signed)
Subjective:    Patient ID: Carrie Shaw, female    DOB: 24-Feb-1986, 29 y.o.   MRN: [id number]  HPI Pt presents for her CPE and pap as well as several problems. 1- Over the last year she has had depressed mood and increased problems with anger and it has gotten worse over the last 6 months.  She feels like she is always nervous and waiting for something bad to happen.  She feels like it started about the time she and her boyfriend started having problems - since then they have moved from living together to separately and now they are back living together again.  She feels like this is not a long term relationship but she does not think it is going to end soon.  She is not sleeping well at night due to the stress and she is unable to stop thinking when she lays down at night. 2- over the last year she has noticed weight gain - she has been trying to lose weight but she has not noticed the change that she would expect.  She has always been overweight.  Most of her family is overweight.  She started going to the gym about a month ago and she has lost about 5 lbs.  She states she is not a stress eater and in fact when she is stressed she finds that she eats less food.  She does know that her weight gain does make her mood worse.  She is happy with her eating and she does not eat differently only. Diet -- Breakfast - protein shake or boil eggs or yogurt and fruit  snacks - Almonds - fruit only Lunch - salad with meat Dinner - meat and salad Drink- water (no sodas, no tea or fruit) 3- she has been having vaginal discharge and itching over the last 2 days.   4- she is interested in starting on OCP - she has been on them in the past and would like to restart them - she is not interested in LARC methods.  Her current boyfriend does not want more children she would like to have 1 more child. 5- menses are more irregular recently - they have always been very regular and now they are irregular but they are  not more heavy even when they are missed  Last dental visit, pap and vision exam were years ago.  She just recently got insurance.  ETOH - rare Smoke - quit cigs about 1 week ago - 1/2 pack/day Drugs - none  Work - time Physiological scientist - sedentary job  Family - hx HTN, majority of family is overweight G3P2 (children 7 and 4)  Review of Systems  Constitutional: Positive for unexpected weight change (increase). Negative for fever, chills and appetite change.  HENT: Negative.   Eyes: Negative.   Respiratory: Negative.   Cardiovascular: Negative.   Gastrointestinal: Negative.   Endocrine: Negative.   Genitourinary: Positive for vaginal discharge.  Skin: Negative.   Allergic/Immunologic: Negative.   Neurological: Negative.   Hematological: Negative.   Psychiatric/Behavioral: Positive for dysphoric mood. Negative for suicidal ideas and sleep disturbance. The patient is nervous/anxious.       Objective:   Physical Exam  Constitutional: She is oriented to person, place, and time. She appears well-developed and well-nourished.  BP 132/86 mmHg  Pulse 83  Temp(Src) 98.1 F (36.7 C)  Resp 18  Ht  (1.803 m)  Wt 385 lb (174.635 kg)  BMI 53.72 kg/m2  SpO2 98%  LMP 12/01/2014   HENT:  Head: Normocephalic and atraumatic.  Right Ear: External ear normal.  Left Ear: External ear normal.  Eyes: Conjunctivae and EOM are normal. Pupils are equal, round, and reactive to light.  Neck: Normal range of motion.  Cardiovascular: Normal rate, regular rhythm and normal heart sounds.   No murmur heard. Pulmonary/Chest: Effort normal and breath sounds normal. She has no wheezes.  Abdominal: Soft. Bowel sounds are normal.  Genitourinary: Uterus normal. Pelvic exam was performed with patient supine. There is no rash, tenderness, lesion or injury on the right labia. There is no rash, tenderness, lesion or injury on the left labia. Cervix exhibits no motion tenderness, no discharge and no  friability. Right adnexum displays no mass, no tenderness and no fullness. Left adnexum displays no mass, no tenderness and no fullness. No signs of injury around the vagina. Vaginal discharge (thick white discharge) found.  Lymphadenopathy:    She has no cervical adenopathy.  Neurological: She is alert and oriented to person, place, and time.  Skin: Skin is warm and dry.  Psychiatric: She has a normal mood and affect. Her behavior is normal. Judgment and thought content normal.   Results for orders placed or performed in visit on 12/25/14  POCT urine pregnancy  Result Value Ref Range   Preg Test, Ur Negative   POCT glucose (manual entry)  Result Value Ref Range   POC Glucose 82 70 - 99 mg/dl  POCT urinalysis dipstick  Result Value Ref Range   Color, UA yellow    Clarity, UA clear    Glucose, UA neg    Bilirubin, UA neg    Ketones, UA neg    Spec Grav, UA 1.015    Blood, UA neg    pH, UA 5.5    Protein, UA neg    Urobilinogen, UA 0.2    Nitrite, UA neg    Leukocytes, UA small (1+)   POCT Wet Prep with KOH  Result Value Ref Range   Trichomonas, UA Negative    Clue Cells Wet Prep HPF POC 1.3    Epithelial Wet Prep HPF POC 10-15    Yeast Wet Prep HPF POC present    Bacteria Wet Prep HPF POC 3+    RBC Wet Prep HPF POC neg    WBC Wet Prep HPF POC 10-15    KOH Prep POC Positive        Assessment & Plan:  Annual physical exam - Plan: CBC with Differential/Platelet, POCT urinalysis dipstick  Weight gain - Plan: TSH, FSH/LH, POCT glucose (manual entry), Ambulatory referral to diabetic education, pt to continue her exercise pattern that she started a week ago  Screening for thyroid disorder - Plan: TSH  Screening cholesterol level - Plan: Lipid panel  Abnormal menses -start OCP and check of PCOS though less likely because she has always been regular until she had increase stress at home  Screening for metabolic disorder - Plan: COMPLETE METABOLIC PANEL WITH GFR  Mood  swings - Pt suggested to seek counseling to help her deal with her current sitution.  Plan: escitalopram (LEXAPRO) 10 MG tablet  Irritability - Plan: escitalopram (LEXAPRO) 10 MG tablet  Insomnia - Plan: traZODone (DESYREL) 50 MG tablet, escitalopram (LEXAPRO) 10 MG tablet  Morbid obesity  Encounter for routine gynecological examination - Plan: Pap IG, CT/NG w/ reflex HPV when ASC-U  Vaginal discharge - Plan: POCT Wet Prep with KOH, Hepatitis C Ab Reflex HCV RNA, QUANT,  Hepatitis B surface antigen, HIV antibody, RPR  Irregular menses - Plan: TSH, FSH/LH, POCT urine pregnancy  OCP (oral contraceptive pills) initiation - Plan: levonorgestrel-ethinyl estradiol (AVIANE) 0.1-20 MG-MCG tablet  Screen for STD (sexually transmitted disease) - Plan: Hepatitis C Ab Reflex HCV RNA, QUANT, Hepatitis B surface antigen, HIV antibody, RPR  Yeast vaginitis - Plan: fluconazole (DIFLUCAN) 150 MG tablet  Benny Lennert PA-C  [facility] 12/25/2014 1:05 PM

## 2014-12-25 NOTE — Patient Instructions (Addendum)
I will contact you with your lab results as soon as they are available.   If you have not heard from me in 2 weeks, please contact me.  The fastest way to get your results is to register for My Chart (see the instructions on the last page of this printout).  For therapy -- [facility] ([address]Beth Coralie CommonKincaid, Ernest McCoy, Heather Joycelyn SchmidKitchens, Karla Garfieldownsend) - [phone] [facility](Julie Alamo[place]) - [phone] [facility] - [phone] [facility]  - [phone] (do not file insurance)   For your trazodone - start with 1/2 a pill at night and then increase every 4 days until either you are sleeping or you are having side effects - stop going up on the medications when either of these happen  I will see you in a month

## 2014-12-26 LAB — HEPATITIS C ANTIBODY: HCV Ab: NEGATIVE

## 2014-12-26 LAB — FSH/LH
FSH: 9.2 m[IU]/mL
LH: 7.2 m[IU]/mL

## 2014-12-26 LAB — HEPATITIS B SURFACE ANTIGEN: HEP B S AG: NEGATIVE

## 2014-12-26 LAB — HIV ANTIBODY (ROUTINE TESTING W REFLEX): HIV: NONREACTIVE

## 2014-12-26 LAB — RPR

## 2014-12-29 LAB — PAP IG, CT-NG, RFX HPV ASCU
Chlamydia Probe Amp: NEGATIVE
GC Probe Amp: NEGATIVE

## 2015-01-07 ENCOUNTER — Encounter: Payer: Self-pay | Admitting: Dietician

## 2015-01-07 ENCOUNTER — Encounter: Payer: BLUE CROSS/BLUE SHIELD | Attending: Physician Assistant | Admitting: Dietician

## 2015-01-07 DIAGNOSIS — Z6841 Body Mass Index (BMI) 40.0 and over, adult: Secondary | ICD-10-CM | POA: Insufficient documentation

## 2015-01-07 DIAGNOSIS — Z713 Dietary counseling and surveillance: Secondary | ICD-10-CM | POA: Insufficient documentation

## 2015-01-07 NOTE — Patient Instructions (Addendum)
Continue level of exercise and think about adding another day. Try to get at least 7 hours of sleep. For breakfast, have 2-3 boiled egg with fruit, toast, sandwich thin, or oatmeal (sweeten with splenda).  For snacks have protein with carbs -  Yogurt, nature valley protein bar or popcorn/rice cakes/cracker/fruit with cheese or nuts.  For lunch and dinner, have protein the size of the palm of your hand, half your plate vegetables, and a quarter of your plate a starch or fruit. Add a carb to dinner. Try spaghetti squash. Think about using a small plate to help with portions. Try to eat meals at a table and with no distractions (TV, phone, computer). Take 20 minutes to eat. Chew well (20 x per bite), put your fork down between bites, take sips of your drink. Have seconds if you are still hungry after 20 minutes.

## 2015-01-07 NOTE — Progress Notes (Signed)
  Medical Nutrition Therapy:  Appt start time: 1100 end time:  1140.   Assessment:  Primary concerns today: Carrie Shaw is here today since she continue to gain weight and it is most likely d/t stress. In the past year she has gained 30 lbs and 7 years she has gained about 60 lbs. Lowest weight as an adult was 320 lbs. Lately has been using MyFitness Pal and exercising 3 x week and lost 4 lbs in the past few weeks. Before this past month she was having a lot more candy, chips, poptarts, and was eating out a lot. Has previously lost weight using Weight Watchers and lost 25 lbs.   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 Lives with her boyfriend and 2 children. She does the food shopping and meal preparation at home. She does not miss/skip meals. She eats out about 2 x week.   Feeling hungry with what she is eating now. Tends to eat meals quickly.   Preferred Learning Style:   No preference indicated   Learning Readiness:   Ready  MEDICATIONS: see list   DIETARY INTAKE:  Usual eating pattern includes 3 meals and 2-3 snacks per day.  Avoided foods include red meat, brussels sprouts  24-hr recall:  B ( AM): 3-4 boiled eggs with fruit or oatmeal or yogurt with sugar-free Hawaiian Punch Snk ( AM):almonds, rice cakes L ( PM): salad with meat or meat with vegetables (leftovers) Snk ( PM): cheese sticks D ( PM): salad/vegetables with meat Snk ( PM): popcorn Beverages: water with flavor packets, coffee with flavored creamer  Usual physical activity:  30-45 minutes cardio and weights 3 x week   Estimated energy needs: 2000 calories 225 g carbohydrates 150 g protein 56 g fat  Progress Towards Goal(s):  In progress.   Nutritional Diagnosis:  [name]-3.3 Overweight/obesity As related to hx of large portion sizes and frequent restaurant meals.  As evidenced by BMI of 53.8.    Intervention:  Nutrition counseling provided. Plan: Continue level of  exercise and think about adding another day. Try to get at least 7 hours of sleep. For breakfast, have 2-3 boiled egg with fruit, toast, sandwich thin, or oatmeal (sweeten with splenda).  For snacks have protein with carbs -  Yogurt, nature valley protein bar or popcorn/rice cakes/cracker/fruit with cheese or nuts.  For lunch and dinner, have protein the size of the palm of your hand, half your plate vegetables, and a quarter of your plate a starch or fruit. Add a carb to dinner. Try spaghetti squash. Think about using a small plate to help with portions. Try to eat meals at a table and with no distractions (TV, phone, computer). Take 20 minutes to eat. Chew well (20 x per bite), put your fork down between bites, take sips of your drink. Have seconds if you are still hungry after 20 minutes.  Teaching Method Utilized:  Visual Auditory Hands on  Handouts given during visit include:  MyPlate Handout  15 g CHO Snacks  Barriers to learning/adherence to lifestyle change: busy schedule  Demonstrated degree of understanding via:  Teach Back   Monitoring/Evaluation:  Dietary intake, exercise, and body weight in 4 week(s).

## 2015-02-04 ENCOUNTER — Encounter: Payer: Self-pay | Admitting: Dietician

## 2015-02-04 ENCOUNTER — Encounter: Payer: BLUE CROSS/BLUE SHIELD | Attending: Physician Assistant | Admitting: Dietician

## 2015-02-04 DIAGNOSIS — Z6841 Body Mass Index (BMI) 40.0 and over, adult: Secondary | ICD-10-CM | POA: Diagnosis not present

## 2015-02-04 DIAGNOSIS — Z713 Dietary counseling and surveillance: Secondary | ICD-10-CM | POA: Insufficient documentation

## 2015-02-04 NOTE — Progress Notes (Signed)
  Medical Nutrition Therapy:  Appt start time: 915 end time:  930.   Assessment:  Primary concerns today: Carrie Shaw is here today with a 2 lb weight gain. Has been having a lot of stress and has not been able to work out recently. Started a green smoothie diet this week for 10 days (only smoothies, then will food gets added back). Feels like it is hard since she is used to eating solid foods. Boyfriend doesn't cook which is hard.  Getting about 7 hours per sleep a night now. No longer taking Lexapro.   Wt Readings from Last 3 Encounters:  02/04/15 387 lb 1.6 oz (175.587 kg)  01/07/15 385 lb 11.2 oz (174.952 kg)  12/25/14 385 lb (174.635 kg)   Ht Readings from Last 3 Encounters:  02/04/15 5\' 11"  (1.803 m)  01/07/15 5\' 11"  (1.803 m)  12/25/14 5\' 11"  (1.803 m)   Body mass index is 54.01 kg/(m^2). @BMIFA @ Normalized weight-for-age data available only for age 9 to 20 years. Normalized stature-for-age data available only for age 9 to 20 years.   Preferred Learning Style:   No preference indicated   Learning Readiness:   Ready  MEDICATIONS: see list   DIETARY INTAKE:  Usual eating pattern includes 3 meals and 2-3 snacks per day.  Avoided foods include red meat, brussels sprouts  24-hr recall:  B ( AM): green smoothie - spinach, grapes, 2 cups fruit, flaxseed and water Snk ( AM):celery and broccoli or grapes L ( PM): green smoothie - spinach, grapes, 2 cups fruit, flaxseed and water Snk ( PM): celery and broccoli or grapes D ( PM): green smoothie - spinach, grapes, 2 cups fruit, flaxseed and water Snk ( PM): celery and broccoli or grapes Beverages: water with flavor packets   Usual physical activity:  30-45 minutes cardio and weights 3 x week (started back this week)  Estimated energy needs: 2000 calories 225 g carbohydrates 150 g protein 56 g fat  Progress Towards Goal(s):  In progress.   Nutritional Diagnosis:  [place]-3.3 Overweight/obesity As related to hx of large  portion sizes and frequent restaurant meals.  As evidenced by BMI of 53.8.    Intervention:  Nutrition counseling provided. Plan: Continue level of exercise and think about adding another day. For a smoothie - have a cup of fruit, spinach, water, flax, and have protein on the side (2 boiled eggs) For snacks have protein with carbs -  Fruit and vegetables with protein (cheese stick or nuts).  For lunch and dinner, have protein the size of the palm of your hand, half your plate vegetables, and a quarter of your plate a starch or fruit. Think about using a small plate to help with portions. Try to eat meals at a table and with no distractions (TV, phone, computer). Take 20 minutes to eat. Chew well (20 x per bite), put your fork down between bites, take sips of your drink. Have seconds if you are still hungry after 20 minutes.  Teaching Method Utilized:  Visual Auditory Hands on  Handouts given during visit include:  MyPlate Handout  15 g CHO Snacks  Barriers to learning/adherence to lifestyle change: busy schedule  Demonstrated degree of understanding via:  Teach Back   Monitoring/Evaluation:  Dietary intake, exercise, and body weight in 4 week(s).

## 2015-02-04 NOTE — Patient Instructions (Addendum)
Continue level of exercise and think about adding another day. For a smoothie - have a cup of fruit, spinach, water, flax, and have protein on the side (2 boiled eggs) For snacks have protein with carbs -  Fruit and vegetables with protein (cheese stick or nuts).  For lunch and dinner, have protein the size of the palm of your hand, half your plate vegetables, and a quarter of your plate a starch or fruit. Think about using a small plate to help with portions. Try to eat meals at a table and with no distractions (TV, phone, computer). Take 20 minutes to eat. Chew well (20 x per bite), put your fork down between bites, take sips of your drink. Have seconds if you are still hungry after 20 minutes.

## 2015-02-12 ENCOUNTER — Ambulatory Visit (INDEPENDENT_AMBULATORY_CARE_PROVIDER_SITE_OTHER): Payer: BLUE CROSS/BLUE SHIELD | Admitting: Physician Assistant

## 2015-02-12 VITALS — BP 124/82 | HR 75 | Temp 98.5°F | Resp 17 | Ht 71.0 in | Wt 384.0 lb

## 2015-02-12 DIAGNOSIS — B9689 Other specified bacterial agents as the cause of diseases classified elsewhere: Secondary | ICD-10-CM

## 2015-02-12 DIAGNOSIS — B373 Candidiasis of vulva and vagina: Secondary | ICD-10-CM | POA: Diagnosis not present

## 2015-02-12 DIAGNOSIS — R454 Irritability and anger: Secondary | ICD-10-CM

## 2015-02-12 DIAGNOSIS — R748 Abnormal levels of other serum enzymes: Secondary | ICD-10-CM

## 2015-02-12 DIAGNOSIS — N760 Acute vaginitis: Secondary | ICD-10-CM | POA: Diagnosis not present

## 2015-02-12 DIAGNOSIS — B3731 Acute candidiasis of vulva and vagina: Secondary | ICD-10-CM

## 2015-02-12 DIAGNOSIS — A499 Bacterial infection, unspecified: Secondary | ICD-10-CM | POA: Diagnosis not present

## 2015-02-12 DIAGNOSIS — N898 Other specified noninflammatory disorders of vagina: Secondary | ICD-10-CM | POA: Diagnosis not present

## 2015-02-12 LAB — POCT WET PREP WITH KOH
KOH Prep POC: NEGATIVE
Trichomonas, UA: NEGATIVE
Yeast Wet Prep HPF POC: NEGATIVE

## 2015-02-12 LAB — HEPATIC FUNCTION PANEL
ALBUMIN: 3.7 g/dL (ref 3.5–5.2)
ALT: 30 U/L (ref 0–35)
AST: 22 U/L (ref 0–37)
Alkaline Phosphatase: 52 U/L (ref 39–117)
BILIRUBIN DIRECT: 0.1 mg/dL (ref 0.0–0.3)
Indirect Bilirubin: 0.4 mg/dL (ref 0.2–1.2)
TOTAL PROTEIN: 7.7 g/dL (ref 6.0–8.3)
Total Bilirubin: 0.5 mg/dL (ref 0.2–1.2)

## 2015-02-12 MED ORDER — SERTRALINE HCL 25 MG PO TABS: 25.0000 mg | ORAL_TABLET | Freq: Every day | ORAL | Status: DC

## 2015-02-12 MED ORDER — FLUCONAZOLE 150 MG PO TABS: 150.0000 mg | ORAL_TABLET | Freq: Once | ORAL | Status: DC

## 2015-02-12 MED ORDER — METRONIDAZOLE 500 MG PO TABS: 500.0000 mg | ORAL_TABLET | Freq: Two times a day (BID) | ORAL | Status: AC

## 2015-02-12 NOTE — Progress Notes (Signed)
   Subjective:    Patient ID: Carrie Shaw, female    DOB: 01/10/1986, 29 y.o.   MRN: [id number]  HPI Pt presents to clinic with vaginal odor and discharge. She does not feel like she has a yeast infection.  She was also unable to tolerate the Lexapro.  She found that her irritability was worse on the medication.  She tried it for a month and then she had to stop it because she felt like she was worse.  She is also here to get her LFT rechecked.  Review of Systems  Genitourinary: Positive for vaginal discharge. Negative for menstrual problem.  Psychiatric/Behavioral: The patient is nervous/anxious (seemed worse on the Lexapro).        Objective:   Physical Exam  Constitutional: She is oriented to person, place, and time. She appears well-developed and well-nourished.  BP 124/82 mmHg  Pulse 75  Temp(Src) 98.5 F (36.9 C) (Oral)  Resp 17  Ht 5\' 11"  (1.803 m)  Wt 384 lb (174.181 kg)  BMI 53.58 kg/m2  SpO2 97%  LMP 01/13/2015   HENT:  Head: Normocephalic and atraumatic.  Right Ear: External ear normal.  Left Ear: External ear normal.  Pulmonary/Chest: Effort normal.  Genitourinary:  Pt performed self wet prep  Neurological: She is alert and oriented to person, place, and time.  Skin: Skin is warm and dry.  Psychiatric: She has a normal mood and affect. Her behavior is normal. Judgment and thought content normal.       Assessment & Plan:  Vaginal discharge - Plan: POCT Wet Prep with KOH  Elevated liver enzymes - Plan: Hepatic Function Panel  Yeast vaginitis - Plan: fluconazole (DIFLUCAN) 150 MG tablet  Irritability - Plan: sertraline (ZOLOFT) 25 MG tablet - we will try this medication.  She will contact me in 2 weeks if she is tolerating this medication well and we will increase the medication to Zoloft 50mg .  If this medication does not work/or is not tolerated we will attempt Effexor.  BV (bacterial vaginosis) - Plan: metroNIDAZOLE (FLAGYL) 500 MG tablet   Benny LennertSarah  Emmaly Leech PA-C  [facility] 02/12/2015 11:42 AM

## 2015-02-12 NOTE — Patient Instructions (Signed)
I will contact you with your lab results as soon as they are available.   If you have not heard from me in 2 weeks, please contact me.  The fastest way to get your results is to register for My Chart (see the instructions on the last page of this printout).   

## 2015-02-17 ENCOUNTER — Encounter: Payer: Self-pay | Admitting: Family Medicine

## 2015-03-11 ENCOUNTER — Ambulatory Visit: Payer: BLUE CROSS/BLUE SHIELD | Admitting: Dietician

## 2015-11-06 ENCOUNTER — Emergency Department (HOSPITAL_COMMUNITY)
Admission: EM | Admit: 2015-11-06 | Discharge: 2015-11-06 | Disposition: A | Discharge status: Home / Self Care | Payer: Managed Care, Other (non HMO) | Attending: Emergency Medicine | Admitting: Emergency Medicine

## 2015-11-06 ENCOUNTER — Encounter (HOSPITAL_COMMUNITY): Payer: Self-pay | Admitting: *Deleted

## 2015-11-06 DIAGNOSIS — J069 Acute upper respiratory infection, unspecified: Secondary | ICD-10-CM | POA: Diagnosis not present

## 2015-11-06 DIAGNOSIS — Z79899 Other long term (current) drug therapy: Secondary | ICD-10-CM | POA: Diagnosis not present

## 2015-11-06 DIAGNOSIS — J029 Acute pharyngitis, unspecified: Secondary | ICD-10-CM

## 2015-11-06 DIAGNOSIS — R05 Cough: Secondary | ICD-10-CM

## 2015-11-06 DIAGNOSIS — F17210 Nicotine dependence, cigarettes, uncomplicated: Secondary | ICD-10-CM | POA: Diagnosis not present

## 2015-11-06 DIAGNOSIS — R059 Cough, unspecified: Secondary | ICD-10-CM

## 2015-11-06 LAB — RAPID STREP SCREEN (MED CTR MEBANE ONLY): STREPTOCOCCUS, GROUP A SCREEN (DIRECT): NEGATIVE

## 2015-11-06 MED ORDER — ACETAMINOPHEN 325 MG PO TABS: 650.0000 mg | ORAL_TABLET | Freq: Four times a day (QID) | ORAL | Status: DC | PRN

## 2015-11-06 MED ORDER — BENZONATATE 100 MG PO CAPS
100.0000 mg | ORAL_CAPSULE | Freq: Three times a day (TID) | ORAL | Status: DC
Start: 2015-11-06 — End: 2016-05-17

## 2015-11-06 MED ORDER — GUAIFENESIN ER 600 MG PO TB12: 600.0000 mg | ORAL_TABLET | Freq: Two times a day (BID) | ORAL | Status: DC | PRN

## 2015-11-06 MED ORDER — ACETAMINOPHEN 325 MG PO TABS
650.0000 mg | ORAL_TABLET | Freq: Once | ORAL | Status: AC | PRN
  Administered 2015-11-06: 650 mg via ORAL
  Filled 2015-11-06: qty 2

## 2015-11-06 NOTE — ED Notes (Signed)
Patient here with her daughters.  Patient is complaining of sore throat and cough.  States she has hx of strep and it feels similar.  No meds prior to arrival.  Lungs are clear   Throat is red on exm

## 2015-11-06 NOTE — Discharge Instructions (Signed)
Your strep test was negative. You likely have a virus. I will give you prescriptions to help with your symptoms.  Take medications as prescribed. Return to the emergency room for worsening condition or new concerning symptoms. Follow up with your regular doctor. If you don't have a regular doctor use one of the numbers below to establish a primary care doctor.   Emergency Department Resource Guide 1) Find a Doctor and Pay Out of Pocket Although you won't have to find out who is covered by your insurance plan, it is a good idea to ask around and get recommendations. You will then need to call the office and see if the doctor you have chosen will accept you as a new patient and what types of options they offer for patients who are self-pay. Some doctors offer discounts or will set up payment plans for their patients who do not have insurance, but you will need to ask so you aren't surprised when you get to your appointment.  2) Contact Your Local Health Department Not all health departments have doctors that can see patients for sick visits, but many do, so it is worth a call to see if yours does. If you don't know where your local health department is, you can check in your phone book. The CDC also has a tool to help you locate your state's health department, and many state websites also have listings of all of their local health departments.  3) Find a Walk-in Clinic If your illness is not likely to be very severe or complicated, you may want to try a walk in clinic. These are popping up all over the country in pharmacies, drugstores, and shopping centers. They're usually staffed by nurse practitioners or physician assistants that have been trained to treat common illnesses and complaints. They're usually fairly quick and inexpensive. However, if you have serious medical issues or chronic medical problems, these are probably not your best option.  No Primary Care Doctor: - Call Health Connect at   [phone] - they can help you locate a primary care doctor that  accepts your insurance, provides certain services, etc. - Physician Referral [phone]  Emergency Department Resource Guide 1) Find a Doctor and Pay Out of Pocket Although you won't have to find out who is covered by your insurance plan, it is a good idea to ask around and get recommendations. You will then need to call the office and see if the doctor you have chosen will accept you as a new patient and what types of options they offer for patients who are self-pay. Some doctors offer discounts or will set up payment plans for their patients who do not have insurance, but you will need to ask so you aren't surprised when you get to your appointment.  2) Contact Your Local Health Department Not all health departments have doctors that can see patients for sick visits, but many do, so it is worth a call to see if yours does. If you don't know where your local health department is, you can check in your phone book. The CDC also has a tool to help you locate your state's health department, and many state websites also have listings of all of their local health departments.  3) Find a Walk-in Clinic If your illness is not likely to be very severe or complicated, you may want to try a walk in clinic. These are popping up all over the country in pharmacies, drugstores, and shopping centers. They're  usually staffed by nurse practitioners or physician assistants that have been trained to treat common illnesses and complaints. They're usually fairly quick and inexpensive. However, if you have serious medical issues or chronic medical problems, these are probably not your best option. ° °No Primary Care Doctor: °- Call Health Connect at  [phone] - they can help you locate a primary care doctor that  accepts your insurance, provides certain services, etc. °- Physician Referral Service- 1-800-533-3463 ° °Chronic Pain  Problems: °Organization         Address  Phone   Notes  °[facility]  [phone] Patients need to be referred by their primary care doctor.  ° °Medication Assistance: °Organization         Address  Phone   Notes  °[place] [facility] [address] °[place], [place] [address] [phone]) [phone] --Must be a resident of [place] °-- Must have NO insurance coverage whatsoever (no Medicaid/ Medicare, etc.) °-- The pt. MUST have a primary care doctor that directs their care regularly and follows them in the community °  °[facility]  (866) 331-1348   °[employer]  (888) 892-1162   ° °Agencies that provide inexpensive medical care: °Organization         Address  Phone   Notes  °[facility]  ([phone]) [phone]   °[facility]    ([phone]) [phone]   °[facility] [address] °[place], [place] [address] [phone]) [phone]   °[facility] [address], °[place] ([phone]) [phone]   °[facility]    ([phone]) [phone]   °[facility]    ([phone]) [phone]   °[facility] ° [address], [place] Phone:  ([phone]) [phone], Fax:  [phone] Hours of Operation:  9 am - 6 pm, M-F.  Also accepts Medicaid/Medicare and self-pay.  °[facility] ° [address], [place] Phone: ([phone]) [phone], Fax: [phone]. Hours of Operation:  8:30 am - 5:30 pm, M-F.  Also accepts Medicaid and self-pay.  °[facility] [place] [address], [place] Phone: ([phone]) [phone]   °[facility] [address], [place], [place] ([phone], Ext. 123 Mondays & Thursdays: 7-9 AM.  First 15 patients are seen on a first come, first serve basis. °  ° °Medicaid-accepting [place] Providers: ° °Organization         Address  Phone   Notes  °[facility] [address], [place] [phone] Also  accepts self-pay patients.  °[facility] [address], [place] ° ([phone]) [phone]   °[facility] [address], [place] [phone]) [phone]   °[facility] [address] [place] [address], [place] [phone]) [phone]   °[facility] [address], [place]  ° ([phone]) [phone] Only accepts [place] Access Medicaid patients after they have their name applied to their card.  ° °Self-Pay (no insurance) in [place]: ° °Organization         Address  Phone   Notes  °Sickle Cell Patients, [facility] [address], [place] ([phone]) [phone]   °[facility] [address],  ([phone]) [phone]   °[facility] ° [address], [place] [phone]) [phone]   °[facility]/Dr. Osei-Bonsu  [address], [place] or [address], [place] [phone] Phone number for both [place] and [place] locations is the same.  [facility] [address] [place] [phone]   [facility] [address], [place] or [address] [phone]   [facility] [address], [place] [phone], phone; [phone], fax Sees patients 1st and 3rd Saturday of every month.  Must not qualify for public or private insurance (i.e. Medicaid, Medicare, [place] [facility] Choice, Veterans' Benefits)  Household income should be no more than 200% of the poverty level The clinic cannot treat you if you are pregnant or think you are pregnant  Sexually transmitted diseases are not treated at the clinic.

## 2015-11-07 NOTE — ED Provider Notes (Signed)
CSN: [id number]     Arrival date & time 11/06/15  1012 History   First MD Initiated Contact with Patient 11/06/15 1045     Chief Complaint  Patient presents with  . Sore Throat  . URI    HPI  Carrie Shaw is an 30 y.o. female with no significant PMH who presents to the ED for evaluation of sore throat and URI symptoms. She is here with her two children who also have the same symptoms. She reports a two day history of sore throat. She states that today she has started developing a nonproductive cough. Denies runny nose or stuffy nose. Denies chest pain or SOB. She is unsure if she has had a fever at home but denies chills. Denies abd pain, n/v/d. She does endorse mild diffuse body aches. She has not tried anything for her symptoms.   History reviewed. No pertinent past medical history. Past Surgical History  Procedure Laterality Date  . Appendectomy     Family History  Problem Relation Age of Onset  . Schizophrenia Sister   . Bipolar disorder Sister   . Depression Mother    Social History  Substance Use Topics  . Smoking status: Current Every Day Smoker -- 0.50 packs/day for 10 years    Types: Cigarettes  . Smokeless tobacco: None  . Alcohol Use: 0.0 oz/week    0 Standard drinks or equivalent per week     Comment: rare   OB History    Gravida Para Term Preterm AB TAB SAB Ectopic Multiple Living   Review of Systems  All other systems reviewed and are negative.     Allergies  Review of patient's allergies indicates no known allergies.  Home Medications   Prior to Admission medications   Medication Sig Start Date End Date Taking? Authorizing Provider  acetaminophen (TYLENOL) 325 MG tablet Take 2 tablets (650 mg total) by mouth every 6 (six) hours as needed. 11/06/15   Ace Gins Javone Ybanez, PA-C  benzonatate (TESSALON) 100 MG capsule Take 1 capsule (100 mg total) by mouth every 8 (eight) hours. 11/06/15   Ace Gins Jamarian Jacinto, PA-C  fluconazole (DIFLUCAN) 150 MG tablet Take  1 tablet (150 mg total) by mouth once. Repeat in 1 week is needed 02/12/15   Morrell Riddle, PA-C  guaiFENesin (MUCINEX) 600 MG 12 hr tablet Take 1 tablet (600 mg total) by mouth 2 (two) times daily as needed. 11/06/15   Carlene Coria, PA-C  levonorgestrel-ethinyl estradiol (AVIANE) 0.1-20 MG-MCG tablet Take 1 tablet by mouth daily. 12/25/14   Morrell Riddle, PA-C  sertraline (ZOLOFT) 25 MG tablet Take 1-2 tablets (25-50 mg total) by mouth daily. 02/12/15   Morrell Riddle, PA-C  traZODone (DESYREL) 50 MG tablet Take 50 mg by mouth at bedtime.    Historical Provider, MD   BP 139/84 mmHg  Pulse 71  Temp(Src) 98 F (36.7 C) (Oral)  Resp 16  SpO2 100% Physical Exam  Constitutional: She is oriented to person, place, and time.  HENT:  Right Ear: External ear normal.  Left Ear: External ear normal.  Nose: Nose normal.  Mouth/Throat: Oropharynx is clear and moist. No oropharyngeal exudate.  +posterior oropharyngeal cobblestoning. Tonsils are unremarkable.  Eyes: Conjunctivae and EOM are normal. Pupils are equal, round, and reactive to light.  Neck: Normal range of motion. Neck supple.  Cardiovascular: Normal rate, regular rhythm, normal heart sounds and intact distal  pulses.   Pulmonary/Chest: Effort normal and breath sounds normal. No respiratory distress. She has no wheezes. She exhibits no tenderness.  Abdominal: Soft. Bowel sounds are normal. She exhibits no distension. There is no tenderness. There is no rebound and no guarding.  Musculoskeletal: She exhibits no edema.  Neurological: She is alert and oriented to person, place, and time. No cranial nerve deficit.  Skin: Skin is warm and dry.  Psychiatric: She has a normal mood and affect.  Nursing note and vitals reviewed.   ED Course  Procedures (including critical care time) Labs Review Labs Reviewed  RAPID STREP SCREEN (NOT AT [facility])  CULTURE, GROUP A STREP [facility])    Imaging Review No results found. I have personally reviewed and  evaluated these images and lab results as part of my medical decision-making.   EKG Interpretation None      MDM   Final diagnoses:  URI (upper respiratory infection)  Sore throat  Cough    Rapid strep was obtained in triage. It is negative. Pt has a nonfocal exam. VSS and she is not tachycardic, hypoxic, or febrile.  I suspect viral URI. Discussed this with pt. Rx given for supportive meds including tylenol, mucinex, and tessalon. Encouraged plenty of fluid intake. Resource guide given to establish PCP for follow up. ER return precautions given.    Carlene Coria, PA-C 11/07/15 1017  Doug Sou, MD 11/07/15 1616

## 2015-11-08 LAB — CULTURE, GROUP A STREP (THRC)

## 2016-05-17 ENCOUNTER — Ambulatory Visit (INDEPENDENT_AMBULATORY_CARE_PROVIDER_SITE_OTHER): Payer: Managed Care, Other (non HMO) | Admitting: Family Medicine

## 2016-05-17 VITALS — HR 96 | Temp 98.2°F | Resp 17 | Ht 71.0 in | Wt 378.0 lb

## 2016-05-17 DIAGNOSIS — Z0000 Encounter for general adult medical examination without abnormal findings: Secondary | ICD-10-CM | POA: Diagnosis not present

## 2016-05-17 DIAGNOSIS — Z1383 Encounter for screening for respiratory disorder NEC: Secondary | ICD-10-CM | POA: Diagnosis not present

## 2016-05-17 DIAGNOSIS — N6011 Diffuse cystic mastopathy of right breast: Secondary | ICD-10-CM

## 2016-05-17 DIAGNOSIS — R635 Abnormal weight gain: Secondary | ICD-10-CM

## 2016-05-17 DIAGNOSIS — Z566 Other physical and mental strain related to work: Secondary | ICD-10-CM

## 2016-05-17 DIAGNOSIS — N898 Other specified noninflammatory disorders of vagina: Secondary | ICD-10-CM

## 2016-05-17 DIAGNOSIS — Z113 Encounter for screening for infections with a predominantly sexual mode of transmission: Secondary | ICD-10-CM | POA: Diagnosis not present

## 2016-05-17 DIAGNOSIS — Z720 Tobacco use: Secondary | ICD-10-CM | POA: Diagnosis not present

## 2016-05-17 DIAGNOSIS — Z136 Encounter for screening for cardiovascular disorders: Secondary | ICD-10-CM | POA: Diagnosis not present

## 2016-05-17 DIAGNOSIS — Z309 Encounter for contraceptive management, unspecified: Secondary | ICD-10-CM

## 2016-05-17 DIAGNOSIS — Z30011 Encounter for initial prescription of contraceptive pills: Secondary | ICD-10-CM

## 2016-05-17 DIAGNOSIS — Z1329 Encounter for screening for other suspected endocrine disorder: Secondary | ICD-10-CM

## 2016-05-17 DIAGNOSIS — R740 Nonspecific elevation of levels of transaminase and lactic acid dehydrogenase [LDH]: Secondary | ICD-10-CM | POA: Diagnosis not present

## 2016-05-17 DIAGNOSIS — R7401 Elevation of levels of liver transaminase levels: Secondary | ICD-10-CM

## 2016-05-17 DIAGNOSIS — Z1389 Encounter for screening for other disorder: Secondary | ICD-10-CM

## 2016-05-17 DIAGNOSIS — E049 Nontoxic goiter, unspecified: Secondary | ICD-10-CM | POA: Diagnosis not present

## 2016-05-17 DIAGNOSIS — Z130 Encounter for screening for diseases of the blood and blood-forming organs and certain disorders involving the immune mechanism: Secondary | ICD-10-CM | POA: Diagnosis not present

## 2016-05-17 DIAGNOSIS — N6012 Diffuse cystic mastopathy of left breast: Secondary | ICD-10-CM

## 2016-05-17 DIAGNOSIS — Z3009 Encounter for other general counseling and advice on contraception: Secondary | ICD-10-CM

## 2016-05-17 LAB — POCT URINALYSIS DIP (MANUAL ENTRY)
BILIRUBIN UA: NEGATIVE
Bilirubin, UA: NEGATIVE
Blood, UA: NEGATIVE
GLUCOSE UA: NEGATIVE
LEUKOCYTES UA: NEGATIVE
NITRITE UA: NEGATIVE
PROTEIN UA: NEGATIVE
SPEC GRAV UA: 1.01
UROBILINOGEN UA: 0.2
pH, UA: 5.5

## 2016-05-17 LAB — CBC
HEMATOCRIT: 41.6 % (ref 35.0–45.0)
HEMOGLOBIN: 13.4 g/dL (ref 11.7–15.5)
MCH: 30.4 pg (ref 27.0–33.0)
MCHC: 32.2 g/dL (ref 32.0–36.0)
MCV: 94.3 fL (ref 80.0–100.0)
MPV: 10.2 fL (ref 7.5–12.5)
Platelets: 330 10*3/uL (ref 140–400)
RBC: 4.41 MIL/uL (ref 3.80–5.10)
RDW: 14.5 % (ref 11.0–15.0)
WBC: 10.4 10*3/uL (ref 3.8–10.8)

## 2016-05-17 LAB — COMPREHENSIVE METABOLIC PANEL
ALK PHOS: 54 U/L (ref 33–115)
ALT: 23 U/L (ref 6–29)
AST: 42 U/L — ABNORMAL HIGH (ref 10–30)
Albumin: 4.1 g/dL (ref 3.6–5.1)
BUN: 10 mg/dL (ref 7–25)
CHLORIDE: 105 mmol/L (ref 98–110)
CO2: 23 mmol/L (ref 20–31)
CREATININE: 0.72 mg/dL (ref 0.50–1.10)
Calcium: 9.1 mg/dL (ref 8.6–10.2)
GLUCOSE: 87 mg/dL (ref 65–99)
POTASSIUM: 4.5 mmol/L (ref 3.5–5.3)
SODIUM: 137 mmol/L (ref 135–146)
TOTAL PROTEIN: 7.6 g/dL (ref 6.1–8.1)
Total Bilirubin: 0.3 mg/dL (ref 0.2–1.2)

## 2016-05-17 LAB — POCT WET + KOH PREP
Trich by wet prep: ABSENT
YEAST BY WET PREP: ABSENT
Yeast by KOH: ABSENT

## 2016-05-17 LAB — POCT URINE PREGNANCY: PREG TEST UR: NEGATIVE

## 2016-05-17 MED ORDER — BUPROPION HCL ER (XL) 150 MG PO TB24
ORAL_TABLET | ORAL | 1 refills | Status: DC
Start: 2016-05-17 — End: 2016-05-25

## 2016-05-17 MED ORDER — METRONIDAZOLE 0.75 % VA GEL: 1.0000 | Freq: Every day | VAGINAL | 0 refills | Status: DC

## 2016-05-17 MED ORDER — LEVONORGESTREL-ETHINYL ESTRAD 0.1-20 MG-MCG PO TABS: 1.0000 | ORAL_TABLET | Freq: Every day | ORAL | 4 refills | Status: DC

## 2016-05-17 NOTE — Patient Instructions (Addendum)
[facility] has a new [place] clinic in [place] on [address]. So far, they seem to be getting good results and since it is medically based your insurance might provide better coverage.  It is probably worth checking to see what your ins coverage is. It is called [employer] and you can call [phone] option 1 for more info.  YOU ARE AT VERY HIGH RISK OF A BLOOD CLOT IF YOU SMOKE WHILE YOU TAKE ORAL CONTRACEPTIVES.   IF you received an x-ray today, you will receive an invoice from [facility]. Please contact [facility] at [phone] with questions or concerns regarding your invoice.   IF you received labwork today, you will receive an invoice from [facility]. Please contact [facility] at [phone] with questions or concerns regarding your invoice.   Our billing staff will not be able to assist you with questions regarding bills from these companies.  You will be contacted with the lab results as soon as they are available. The fastest way to get your results is to activate your My Chart account. Instructions are located on the last page of this paperwork. If you have not heard from us regarding the results in 2 weeks, please contact this office.    Fibrocystic Breast Changes Fibrocystic breast changes occur when breast ducts become blocked, causing painful, fluid-filled lumps (cysts) to form in the breast. This is a common condition that is noncancerous (benign). It occurs when women go through hormonal changes during their menstrual cycle. Fibrocystic breast changes can affect one or both breasts. CAUSES  The exact cause of fibrocystic breast changes is not known, but it may be related to the female hormones estrogen and progesterone. Family traits that get passed from parent to child (genetics) may also be a factor in some cases. SIGNS AND SYMPTOMS   Tenderness, mild discomfort, or pain.   Swelling.   Rope-like  feeling when touching the breast.   Lumpy breast, one or both sides.   Changes in breast size, especially before (larger) and after (smaller) the menstrual period.   Green or dark brown nipple discharge (not blood).  Symptoms are usually worse before menstrual periods start and get better toward the end of the menstrual period.  DIAGNOSIS  To make a diagnosis, your health care provider will ask you questions and perform a physical exam of your breasts. The health care provider may recommend other tests that can examine inside your breasts, such as:  A breast X-ray (mammogram).   Ultrasonography.  An MRI.  If something more than fibrocystic breast changes is suspected, your health care provider may take a breast tissue sample (breast biopsy) to examine. TREATMENT  Often, treatment is not needed. Your health care provider may recommend over-the-counter pain relievers to help lessen pain or discomfort caused by the fibrocystic breast changes. You may also be asked to change your diet to limit or stop eating foods or drinking beverages that contain caffeine. Foods and beverages that contain caffeine include chocolate, soda, coffee, and tea. Reducing sugar and fat in your diet may also help. Your health care provider may also recommend:  Fine needle aspiration to remove fluid from a cyst that is causing pain.   Surgery to remove a large, persistent, and tender cyst. HOME CARE INSTRUCTIONS   Examine your breasts after every menstrual period. If you do not have menstrual periods, check your breasts the first day of every month. Feel for changes, such as more tenderness, a  new growth, a change in breast size, or a change in a lump that has always been there.   Only take over-the-counter or prescription medicine as directed by your health care provider.   Wear a well-fitted support or sports bra, especially when exercising.   Decrease or avoid caffeine, fat, and sugar in your diet as  directed by your health care provider.  SEEK MEDICAL CARE IF:   You have fluid leaking (discharge) from your nipples, especially bloody discharge.   You have new lumps or bumps in the breast.   Your breast or breasts become enlarged, red, and painful.   You have areas of your breast that pucker in.   Your nipples appear flat or indented.    This information is not intended to replace advice given to you by your health care provider. Make sure you discuss any questions you have with your health care provider.   Document Released: 07/05/2006 Document Revised: 06/09/2015 Document Reviewed: 03/09/2013 Elsevier Interactive Patient Education 2016 [employer]  Oral Contraception Information Oral contraceptive pills (OCPs) are medicines taken to prevent pregnancy. OCPs work by preventing the ovaries from releasing eggs. The hormones in OCPs also cause the cervical mucus to thicken, preventing the sperm from entering the uterus. The hormones also cause the uterine lining to become thin, not allowing a fertilized egg to attach to the inside of the uterus. OCPs are highly effective when taken exactly as prescribed. However, OCPs do not prevent sexually transmitted diseases (STDs). Safe sex practices, such as using condoms along with the pill, can help prevent STDs.  Before taking the pill, you may have a physical exam and Pap test. Your health care provider may order blood tests. The health care provider will make sure you are a good candidate for oral contraception. Discuss with your health care provider the possible side effects of the OCP you may be prescribed. When starting an OCP, it can take 2 to 3 months for the body to adjust to the changes in hormone levels in your body.  TYPES OF ORAL CONTRACEPTION  The combination pill--This pill contains estrogen and progestin (synthetic progesterone) hormones. The combination pill comes in 21-day, 28-day, or 91-day packs. Some types of combination  pills are meant to be taken continuously (365-day pills). With 21-day packs, you do not take pills for 7 days after the last pill. With 28-day packs, the pill is taken every day. The last 7 pills are without hormones. Certain types of pills have more than 21 hormone-containing pills. With 91-day packs, the first 84 pills contain both hormones, and the last 7 pills contain no hormones or contain estrogen only.  The minipill--This pill contains the progesterone hormone only. The pill is taken every day continuously. It is very important to take the pill at the same time each day. The minipill comes in packs of 28 pills. All 28 pills contain the hormone.  ADVANTAGES OF ORAL CONTRACEPTIVE PILLS  Decreases premenstrual symptoms.   Treats menstrual period cramps.   Regulates the menstrual cycle.   Decreases a heavy menstrual flow.   May treatacne, depending on the type of pill.   Treats abnormal uterine bleeding.   Treats polycystic ovarian syndrome.   Treats endometriosis.   Can be used as emergency contraception.  THINGS THAT CAN MAKE ORAL CONTRACEPTIVE PILLS LESS EFFECTIVE OCPs can be less effective if:   You forget to take the pill at the same time every day.   You have a stomach or intestinal disease  that lessens the absorption of the pill.   You take OCPs with other medicines that make OCPs less effective, such as antibiotics, certain HIV medicines, and some seizure medicines.   You take expired OCPs.   You forget to restart the pill on day 7, when using the packs of 21 pills.  RISKS ASSOCIATED WITH ORAL CONTRACEPTIVE PILLS  Oral contraceptive pills can sometimes cause side effects, such as:  Headache.  Nausea.  Breast tenderness.  Irregular bleeding or spotting. Combination pills are also associated with a small increased risk of:  Blood clots.  Heart attack.  Stroke.   This information is not intended to replace advice given to you by your health  care provider. Make sure you discuss any questions you have with your health care provider.   Document Released: 12/09/2002 Document Revised: 07/09/2013 Document Reviewed: 03/09/2013 Elsevier Interactive Patient Education 2016 Elsevier Inc.  Oral Contraception Use Oral contraceptive pills (OCPs) are medicines taken to prevent pregnancy. OCPs work by preventing the ovaries from releasing eggs. The hormones in OCPs also cause the cervical mucus to thicken, preventing the sperm from entering the uterus. The hormones also cause the uterine lining to become thin, not allowing a fertilized egg to attach to the inside of the uterus. OCPs are highly effective when taken exactly as prescribed. However, OCPs do not prevent sexually transmitted diseases (STDs). Safe sex practices, such as using condoms along with an OCP, can help prevent STDs. Before taking OCPs, you may have a physical exam and Pap test. Your health care provider may also order blood tests if necessary. Your health care provider will make sure you are a good candidate for oral contraception. Discuss with your health care provider the possible side effects of the OCP you may be prescribed. When starting an OCP, it can take 2 to 3 months for the body to adjust to the changes in hormone levels in your body.  HOW TO TAKE ORAL CONTRACEPTIVE PILLS Your health care provider may advise you on how to start taking the first cycle of OCPs. Otherwise, you can:   Start on day 1 of your menstrual period. You will not need any backup contraceptive protection with this start time.   Start on the first Sunday after your menstrual period or the day you get your prescription. In these cases, you will need to use backup contraceptive protection for the first week.   Start the pill at any time of your cycle. If you take the pill within 5 days of the start of your period, you are protected against pregnancy right away. In this case, you will not need a backup  form of birth control. If you start at any other time of your menstrual cycle, you will need to use another form of birth control for 7 days. If your OCP is the type called a minipill, it will protect you from pregnancy after taking it for 2 days (48 hours). After you have started taking OCPs:   If you forget to take 1 pill, take it as soon as you remember. Take the next pill at the regular time.   If you miss 2 or more pills, call your health care provider because different pills have different instructions for missed doses. Use backup birth control until your next menstrual period starts.   If you use a 28-day pack that contains inactive pills and you miss 1 of the last 7 pills (pills with no hormones), it will not matter. Throw away the  rest of the non-hormone pills and start a new pill pack.  No matter which day you start the OCP, you will always start a new pack on that same day of the week. Have an extra pack of OCPs and a backup contraceptive method available in case you miss some pills or lose your OCP pack.  HOME CARE INSTRUCTIONS   Do not smoke.   Always use a condom to protect against STDs. OCPs do not protect against STDs.   Use a calendar to mark your menstrual period days.   Read the information and directions that came with your OCP. Talk to your health care provider if you have questions.  SEEK MEDICAL CARE IF:   You develop nausea and vomiting.   You have abnormal vaginal discharge or bleeding.   You develop a rash.   You miss your menstrual period.   You are losing your hair.   You need treatment for mood swings or depression.   You get dizzy when taking the OCP.   You develop acne from taking the OCP.   You become pregnant.  SEEK IMMEDIATE MEDICAL CARE IF:   You develop chest pain.   You develop shortness of breath.   You have an uncontrolled or severe headache.   You develop numbness or slurred speech.   You develop visual  problems.   You develop pain, redness, and swelling in the legs.    This information is not intended to replace advice given to you by your health care provider. Make sure you discuss any questions you have with your health care provider.   Document Released: 09/07/2011 Document Revised: 10/09/2014 Document Reviewed: 03/09/2013 Elsevier Interactive Patient Education [employer]

## 2016-05-18 LAB — THYROID PANEL WITH TSH
Free Thyroxine Index: 2.6 (ref 1.4–3.8)
T3 Uptake: 30 % (ref 22–35)
T4 TOTAL: 8.7 ug/dL (ref 4.5–12.0)
TSH: 1.86 m[IU]/L

## 2016-05-18 LAB — HIV ANTIBODY (ROUTINE TESTING W REFLEX): HIV: NONREACTIVE

## 2016-05-18 LAB — HEPATITIS C ANTIBODY: HCV AB: NEGATIVE

## 2016-05-18 LAB — GC/CHLAMYDIA PROBE AMP
CT PROBE, AMP APTIMA: NOT DETECTED
GC Probe RNA: NOT DETECTED

## 2016-05-18 LAB — RPR

## 2016-05-24 ENCOUNTER — Encounter: Payer: Self-pay | Admitting: Family Medicine

## 2016-05-24 ENCOUNTER — Telehealth: Payer: Self-pay | Admitting: *Deleted

## 2016-05-24 ENCOUNTER — Other Ambulatory Visit: Payer: Self-pay

## 2016-05-24 NOTE — Telephone Encounter (Signed)
Patient stated the pharmacy need approval of Wellbutrin 150 MG. Patient stated the medication cost $200 with her insurance. [phone].

## 2016-05-24 NOTE — Telephone Encounter (Signed)
Prior authorization or we can send in 300mg  XL and see if that goes through

## 2016-05-24 NOTE — Progress Notes (Signed)
Subjective:  By signing my name below, I, Carrie Shaw, attest that this documentation has been prepared under the direction and in the presence of Norberto Sorenson, MD. Electronically Signed: Stann Shaw, Scribe. 05/24/2016 , 11:23 AM .  Patient was seen in Room 10 .   Patient ID: Carrie Shaw, female    DOB: 1985/10/24, 30 y.o.   MRN: [id number] Chief Complaint  Patient presents with  . Annual Exam    Personal    HPI Carrie Shaw is a 30 y.o. female who presents to [facility] for annual physical. She had blood work done 1.5 years ago during her physical exam. She had normal pap smear, and cholesterol was good. She has eaten something today.   Smoking She's currently smoking about 4 cigarettes a day. She's been trying to cut back but her work is stressful. She's also tried e-cigarettes in the past but it wasn't effective for her. She's interested in quitting but nothing has been very effective. She was on chantix in the past for 2~3 weeks but it caused her to have bad dreams, which she wasn't able to tolerate. However, her smoking was decreased at that time.   Medications She takes sertraline qd, and it works well for her mood.  She takes trazodone at night for sleep. She denies taking it every night.   Birth Control She's currently not on birth control pills. She ran out of her Aviane but did not refill it. She denies any complications with it. Her periods occasionally start late and would be heavy, but no change. She denies history of abnormal pap smear.   STD testing She agrees to STD testing done today. She informs her boyfriend has had penile discharge recently. She also noticed some vaginal odor. She denies any vaginal discharge.   Weight Loss She's been trying to lose weight but it hasn't been working recently, as her weight has plateau'd. She's eating more vegetables, decreased carbs in diet, and working out for the past 3 months. She has lost about 20 lbs since she started.  She's been doing "Insanity" workout at home everyday, with cardio every other day.   Family History She denies breast cancer in family history.   No past medical history on file. Prior to Admission medications   Medication Sig Start Date End Date Taking? Authorizing Provider  acetaminophen (TYLENOL) 325 MG tablet Take 2 tablets (650 mg total) by mouth every 6 (six) hours as needed. 11/06/15  Yes Ace Gins Sam, PA-C  benzonatate (TESSALON) 100 MG capsule Take 1 capsule (100 mg total) by mouth every 8 (eight) hours. 11/06/15  Yes Ace Gins Sam, PA-C  guaiFENesin (MUCINEX) 600 MG 12 hr tablet Take 1 tablet (600 mg total) by mouth 2 (two) times daily as needed. 11/06/15  Yes Ace Gins Sam, PA-C  levonorgestrel-ethinyl estradiol (AVIANE) 0.1-20 MG-MCG tablet Take 1 tablet by mouth daily. 12/25/14  Yes Morrell Riddle, PA-C  sertraline (ZOLOFT) 25 MG tablet Take 1-2 tablets (25-50 mg total) by mouth daily. 02/12/15  Yes Morrell Riddle, PA-C  traZODone (DESYREL) 50 MG tablet Take 50 mg by mouth at bedtime.   Yes Historical Provider, MD  fluconazole (DIFLUCAN) 150 MG tablet Take 1 tablet (150 mg total) by mouth once. Repeat in 1 week is needed Patient not taking: Reported on 05/17/2016 02/12/15   Morrell Riddle, PA-C   No Known Allergies    Past Surgical History:  Procedure Laterality Date  . APPENDECTOMY  Family History  Problem Relation Age of Onset  . Depression Mother   . Hypertension Mother   . Hypertension Father   . Schizophrenia Sister   . Bipolar disorder Sister    Social History   Social History  . Marital status: Single    Spouse name: N/A  . Number of children: N/A  . Years of education: N/A   Social History Main Topics  . Smoking status: Current Every Day Smoker    Packs/day: 0.50    Years: 10.00    Types: Cigarettes  . Smokeless tobacco: None  . Alcohol use 0.0 oz/week     Comment: rare  . Drug use: No  . Sexual activity: Yes    Partners: Male   Other Topics Concern  .  None   Social History Narrative   Works at [employer]    Review of Systems  Constitutional: Negative for activity change, chills, fatigue and fever.  Respiratory: Negative for cough, shortness of breath and wheezing.   Gastrointestinal: Negative for diarrhea, nausea and vomiting.  Genitourinary: Negative for genital sores, menstrual problem and vaginal discharge.  All other systems reviewed and are negative.      Objective:   Physical Exam  Constitutional: She is oriented to person, place, and time. She appears well-developed and well-nourished. No distress.  HENT:  Head: Normocephalic and atraumatic.  Mouth/Throat: Oropharynx is clear and moist.  Eyes: EOM are normal. Pupils are equal, round, and reactive to light.  Neck: Neck supple. Thyromegaly present.  Cardiovascular: Normal rate, regular rhythm and normal heart sounds.   Pulmonary/Chest: Effort normal and breath sounds normal. No respiratory distress.  Abdominal:  abdomen benign, but exam difficult due to body habitus  Genitourinary:  Genitourinary Comments: Very fibrocystic breasts with tenderness throughout, no axillary adenopathy Labia normal, vagina with mild amount of thin white vaginal discharge, cervix not visible secondary to body habitus  Musculoskeletal: Normal range of motion.  Lymphadenopathy:    She has no cervical adenopathy.  Neurological: She is alert and oriented to person, place, and time.  Skin: Skin is warm and dry.  Psychiatric: She has a normal mood and affect. Her behavior is normal.  Nursing note and vitals reviewed.   Pulse 96   Temp 98.2 F (36.8 C) (Oral)   Resp 17   Ht 5\' 11"  (1.803 m)   Wt (!) 378 lb (171.5 kg)   LMP 04/29/2016   SpO2 98%   BMI 52.72 kg/m    Results for orders placed or performed in visit on 05/17/16  GC/Chlamydia Probe Amp  Result Value Ref Range   CT Probe RNA NOT DETECTED    GC Probe RNA NOT DETECTED   Comprehensive metabolic panel  Result Value Ref  Range   Sodium 137 135 - 146 mmol/L   Potassium 4.5 3.5 - 5.3 mmol/L   Chloride 105 98 - 110 mmol/L   CO2 23 20 - 31 mmol/L   Glucose, Bld 87 65 - 99 mg/dL   BUN 10 7 - 25 mg/dL   Creat 1.61 0.96 - 0.45 mg/dL   Total Bilirubin 0.3 0.2 - 1.2 mg/dL   Alkaline Phosphatase 54 33 - 115 U/L   AST 42 (H) 10 - 30 U/L   ALT 23 6 - 29 U/L   Total Protein 7.6 6.1 - 8.1 g/dL   Albumin 4.1 3.6 - 5.1 g/dL   Calcium 9.1 8.6 - 40.9 mg/dL  Thyroid Panel With TSH  Result Value Ref Range  T4, Total 8.7 4.5 - 12.0 ug/dL   T3 Uptake 30 22 - 35 %   Free Thyroxine Index 2.6 1.4 - 3.8   TSH 1.86 mIU/L  CBC  Result Value Ref Range   WBC 10.4 3.8 - 10.8 K/uL   RBC 4.41 3.80 - 5.10 MIL/uL   Hemoglobin 13.4 11.7 - 15.5 g/dL   HCT 16.141.6 09.635.0 - 04.545.0 %   MCV 94.3 80.0 - 100.0 fL   MCH 30.4 27.0 - 33.0 pg   MCHC 32.2 32.0 - 36.0 g/dL   RDW 40.914.5 81.111.0 - 91.415.0 %   Platelets 330 140 - 400 K/uL   MPV 10.2 7.5 - 12.5 fL  RPR  Result Value Ref Range   RPR Ser Ql NON REAC NON REAC  HIV antibody  Result Value Ref Range   HIV 1&2 Ab, 4th Generation NONREACTIVE NONREACTIVE  Hepatitis C Antibody  Result Value Ref Range   HCV Ab NEGATIVE NEGATIVE  POCT urinalysis dipstick  Result Value Ref Range   Color, UA yellow yellow   Clarity, UA clear clear   Glucose, UA negative negative   Bilirubin, UA negative negative   Ketones, POC UA negative negative   Spec Grav, UA 1.010    Blood, UA negative negative   pH, UA 5.5    Protein Ur, POC negative negative   Urobilinogen, UA 0.2    Nitrite, UA Negative Negative   Leukocytes, UA Negative Negative  POCT urine pregnancy  Result Value Ref Range   Preg Test, Ur Negative Negative  POCT Wet + KOH Prep  Result Value Ref Range   Yeast by KOH Absent Present, Absent   Yeast by wet prep Absent Present, Absent   WBC by wet prep Few None, Few, Too numerous to count   Clue Cells Wet Prep HPF POC Few (A) None, Too numerous to count   Trich by wet prep Absent Present,  Absent   Bacteria Wet Prep HPF POC Few None, Few, Too numerous to count   Epithelial Cells By [employer]et Pref (UMFC) Moderate (A) None, Few, Too numerous to count   RBC,UR,HPF,POC None None RBC/hpf       Assessment & Plan:   When we get a refill request for wellbutrin, increase to 300 mg qd.   1. Annual physical exam   2. Routine screening for STI (sexually transmitted infection)   3. Screening for cardiovascular, respiratory, and genitourinary diseases   4. Screening for deficiency anemia   5. Screening for thyroid disorder   6. Weight gain, abnormal   7. Family planning   8. Vaginal discharge   9. Elevated transaminase level   10. Tobacco abuse   11. Stress at work   12. Enlarged thyroid   13. OCP (oral contraceptive pills) initiation - reviewed high rick of DVT/PE due to tob use and OCP - pt would like to restart ocps anyway.  14. Fibrocystic breast changes of both breasts    Start wellbutrin - hopefully will help with mood sxs, energy, weight loss, and smoking cessation.  Orders Placed This Encounter  Procedures  . GC/Chlamydia Probe Amp  . Comprehensive metabolic panel  . Thyroid Panel With TSH  . CBC  . RPR  . HIV antibody  . Hepatitis C Antibody  . POCT urinalysis dipstick  . POCT urine pregnancy  . POCT Wet + KOH Prep    Meds ordered this encounter  Medications  . buPROPion (WELLBUTRIN XL) 150 MG 24 hr tablet  Sig: Increase to 2 tabs a day after 2 weeks, then call for refill.    Dispense:  60 tablet    Refill:  1  . levonorgestrel-ethinyl estradiol (AVIANE) 0.1-20 MG-MCG tablet    Sig: Take 1 tablet by mouth daily.    Dispense:  3 Package    Refill:  4  . metroNIDAZOLE (METROGEL VAGINAL) 0.75 % vaginal gel    Sig: Place 1 Applicatorful vaginally at bedtime. X 5d    Dispense:  70 g    Refill:  0    I personally performed the services described in this documentation, which was scribed in my presence. The recorded information has been reviewed and considered,  and addended by me as needed.   Norberto SorensonEva Shaw, M.D.  [facility]  [place] [address] [place], [place] [address] [phone] phone [phone] fax  05/24/16 10:48 PM

## 2016-05-24 NOTE — Telephone Encounter (Signed)
Send to lab pool

## 2016-05-25 MED ORDER — BUPROPION HCL ER (XL) 150 MG PO TB24: ORAL_TABLET | ORAL | 0 refills | Status: DC

## 2016-05-25 MED ORDER — BUPROPION HCL ER (XL) 300 MG PO TB24: 300.0000 mg | ORAL_TABLET | Freq: Every day | ORAL | Status: DC

## 2016-05-25 MED ORDER — BUPROPION HCL ER (XL) 300 MG PO TB24: 300.0000 mg | ORAL_TABLET | Freq: Every day | ORAL | 1 refills | Status: DC

## 2016-05-25 NOTE — Telephone Encounter (Signed)
Pt called because her wellbutrin Rx did not get to the pharm. It looks like the Rx was printed by accident instead of being set on "normal" when ordered. However, Dr Clelia CroftShaw, I see that your intention was to titrate pt up after 2 wks instead of starting off at 300 mg. The way around this is to send in two different Rxs, one for 150 mg QD for #14, and then a second for pt to start after finishing the 150 mg Rx at the 300 mg strength so ins will cover it. This way pt is never taking more than one tab a day (ins doesn't like to pay for two tabs of lower strength when Rx can be written for just 1 tab of higher strength. I will send the Rxs in this way since dose is same as you had written for originally. Routing this FYI, let me know if not OK and you want me to change anything please.

## 2016-05-25 NOTE — Telephone Encounter (Signed)
That's unexpected - I don't know that I've ever heard of needing a PA for wellbutrin.  It cost about $25 with goodrx coupon (and same price for IR and SR versions). Ok to try the 300 tab but she needs to be pt with side effects - they should go away within several days. If she can't tolerate it, let us know so we can do the PA for the 150mg  dose first.

## 2016-05-25 NOTE — Telephone Encounter (Signed)
Called pt and explained new Rxs. She verbalized understanding of how to take them.

## 2016-05-25 NOTE — Addendum Note (Signed)
Addended by: Sheppard PlumberBRIGGS, Izabellah Dadisman A on: 05/25/2016 04:00 PM   Modules accepted: Orders

## 2016-05-26 NOTE — Telephone Encounter (Signed)
That is perfect, thank you

## 2016-05-29 ENCOUNTER — Telehealth: Payer: Self-pay

## 2016-05-29 NOTE — Telephone Encounter (Signed)
PATIENT SAW DR. SHAW ABOUT A WEEK AND A HALF AGO AND SHE WAS PRESCRIBED METRONIDAZOLE 0.75 BY DR. SHAW. SHE WOULD LIKE DR. SHAW TO CALL HER SOMETHING IN FOR A YEAST INFECTION SO THAT SHE DOES NOT HAVE TO COME BACK INTO THE OFFICE TO BE SEEN AGAIN. BEST PHONE [phone] (CELL)  PHARMACY CHOICE IS [facility] ON [address].  MBC

## 2016-05-29 NOTE — Telephone Encounter (Signed)
metrogel does not typically cause a yeast infection.  Otc monistat is just as effective as prescription treatment.  What are her sxs?  Is she still having vag d/c"  Is it changed? Any itching, odor, or pain?

## 2016-05-30 MED ORDER — FLUCONAZOLE 150 MG PO TABS: 150.0000 mg | ORAL_TABLET | Freq: Once | ORAL | 0 refills | Status: AC

## 2016-05-30 NOTE — Telephone Encounter (Signed)
Pt did not want to come in for repeat urine for pregnancy test and she has not been sexually active since 05/17/16. Risks explained to patient. She is having vaginal discharge and itching.  Per Casimiro NeedleMichael ok to send in Diflucan.

## 2016-06-15 ENCOUNTER — Encounter: Payer: Self-pay | Admitting: Gastroenterology

## 2016-08-21 ENCOUNTER — Ambulatory Visit: Payer: BLUE CROSS/BLUE SHIELD | Admitting: Gastroenterology

## 2016-09-04 ENCOUNTER — Ambulatory Visit: Payer: Managed Care, Other (non HMO) | Admitting: Gastroenterology

## 2016-12-24 ENCOUNTER — Encounter (HOSPITAL_COMMUNITY): Payer: Self-pay | Admitting: *Deleted

## 2016-12-24 ENCOUNTER — Ambulatory Visit (HOSPITAL_COMMUNITY)
Admission: EM | Admit: 2016-12-24 | Discharge: 2016-12-24 | Disposition: A | Discharge status: Home / Self Care | Payer: 59 | Attending: Family Medicine | Admitting: Family Medicine

## 2016-12-24 DIAGNOSIS — N898 Other specified noninflammatory disorders of vagina: Secondary | ICD-10-CM

## 2016-12-24 MED ORDER — METRONIDAZOLE 500 MG PO TABS: 500.0000 mg | ORAL_TABLET | Freq: Two times a day (BID) | ORAL | 0 refills | Status: DC

## 2016-12-24 MED ORDER — FLUCONAZOLE 200 MG PO TABS: ORAL_TABLET | ORAL | 0 refills | Status: DC

## 2016-12-24 NOTE — Discharge Instructions (Signed)
Buy an over the counter vaginal yeast treatment for comfort.  Eat yogurt or take a probiotic with lactobacillus in it daily to prevent reoccurrence.  No douching or washing the genitals with soap.  Water only.

## 2016-12-24 NOTE — ED Provider Notes (Signed)
CSN: [id number]     Arrival date & time 12/24/16  1644 History   First MD Initiated Contact with Patient 12/24/16 1819     Chief Complaint  Patient presents with  . Vaginal Itching   (Consider location/radiation/quality/duration/timing/severity/associated sxs/prior Treatment)  HPI   The patient is a 31 year old female presenting today with complaints of vaginal itching with thick white vaginal discharge for approximately 2-3 days. Patient states "I have a yeast infection". States she has a history of frequent yeast infections and symptoms are identical. Additionally, patient reports that she does typically develop symptoms of bacterial vaginosis following the tearing of her yeast infection. Patient douches and was unaware of the cause of x-ray showed relationship between douching and vaginal infection.  History reviewed. No pertinent past medical history. Past Surgical History:  Procedure Laterality Date  . APPENDECTOMY     Family History  Problem Relation Age of Onset  . Depression Mother   . Hypertension Mother   . Hypertension Father   . Schizophrenia Sister   . Bipolar disorder Sister    Social History  Substance Use Topics  . Smoking status: Current Every Day Smoker    Packs/day: 0.50    Years: 10.00    Types: Cigarettes  . Smokeless tobacco: Not on file  . Alcohol use Yes     Comment: rare   OB History    Gravida Para Term Preterm AB Living   3 2     1      SAB TAB Ectopic Multiple Live Births   1             Review of Systems  Constitutional: Negative.  Negative for fatigue and fever.  HENT: Negative.   Eyes: Negative.  Negative for visual disturbance.  Respiratory: Negative.  Negative for cough and shortness of breath.   Cardiovascular: Negative.  Negative for chest pain and leg swelling.  Gastrointestinal: Negative.   Endocrine: Negative.   Genitourinary: Positive for vaginal discharge. Negative for difficulty urinating, dysuria, flank pain, hematuria,  menstrual problem and vaginal pain.  Musculoskeletal: Negative.  Negative for gait problem and neck stiffness.  Skin: Negative.  Negative for rash.  Allergic/Immunologic: Negative.   Neurological: Negative.  Negative for dizziness and headaches.  Hematological: Negative.   Psychiatric/Behavioral: Negative.     Allergies  Patient has no known allergies.  Home Medications   Prior to Admission medications   Medication Sig Start Date End Date Taking? Authorizing Provider  acetaminophen (TYLENOL) 325 MG tablet Take 2 tablets (650 mg total) by mouth every 6 (six) hours as needed. 11/06/15   Ace Gins Sam, PA-C  buPROPion (WELLBUTRIN XL) 150 MG 24 hr tablet Take 1 tablet by mouth daily for 2 weeks. Then begin taking the next Rx for 300 mg daily. 05/25/16   Sherren Mocha, MD  buPROPion (WELLBUTRIN XL) 300 MG 24 hr tablet Take 1 tablet (300 mg total) by mouth daily. Begin after finishing two weeks of 150 mg daily. 05/25/16   Sherren Mocha, MD  fluconazole (DIFLUCAN) 200 MG tablet Take one 200 mg tablet on day one and the second 200 mg tablet on day four. 12/24/16   Servando Salina, NP  guaiFENesin (MUCINEX) 600 MG 12 hr tablet Take 1 tablet (600 mg total) by mouth 2 (two) times daily as needed. 11/06/15   Carlene Coria, PA-C  levonorgestrel-ethinyl estradiol (AVIANE) 0.1-20 MG-MCG tablet Take 1 tablet by mouth daily. 05/17/16   Sherren Mocha, MD  metroNIDAZOLE (FLAGYL)  500 MG tablet Take 1 tablet (500 mg total) by mouth 2 (two) times daily. 12/24/16   Servando Salinaatherine H Rossi, NP  metroNIDAZOLE (METROGEL VAGINAL) 0.75 % vaginal gel Place 1 Applicatorful vaginally at bedtime. X 5d 05/17/16   Sherren MochaEva N Shaw, MD  traZODone (DESYREL) 50 MG tablet Take 50 mg by mouth at bedtime.    Historical Provider, MD   Meds Ordered and Administered this Visit  Medications - No data to display  BP (!) 177/81   Pulse 76   Temp 97.7 F (36.5 C) (Oral)   Resp 18   LMP 11/30/2016 (Approximate)   SpO2 100%  No data found.   Physical Exam   Constitutional: She is oriented to person, place, and time. She appears well-developed and well-nourished. No distress.  Cardiovascular: Normal rate, regular rhythm, normal heart sounds and intact distal pulses.  Exam reveals no gallop and no friction rub.   No murmur heard. Pulmonary/Chest: Effort normal and breath sounds normal. No respiratory distress. She has no wheezes. She has no rales. She exhibits no tenderness.  Abdominal: Soft. Bowel sounds are normal. She exhibits no distension and no mass. There is no tenderness. There is no rebound and no guarding. No hernia.  Genitourinary:  Genitourinary Comments: Patient declined pelvic examination.  She seems well versed in her symptoms and prior treatments and is not concerned with regards to STI testing.  States tests appropriately with regular exam.   Neurological: She is alert and oriented to person, place, and time.  Skin: Skin is warm and dry. She is not diaphoretic.  Nursing note and vitals reviewed.   Urgent Care Course     Procedures (including critical care time)  Labs Review Labs Reviewed - No data to display  Imaging Review No results found.  Likely vaginal yeast infection.  Patient given scrip for bacterial vaginosis per request to use if needed.  Will try probiotic and OTC monistat as well.   MDM   1. Vaginal discharge    Meds ordered this encounter  Medications  . metroNIDAZOLE (FLAGYL) 500 MG tablet    Sig: Take 1 tablet (500 mg total) by mouth 2 (two) times daily.    Dispense:  14 tablet    Refill:  0  . fluconazole (DIFLUCAN) 200 MG tablet    Sig: Take one 200 mg tablet on day one and the second 200 mg tablet on day four.    Dispense:  2 tablet    Refill:  0   The usual and customary discharge instructions and warnings were given.  The patient verbalizes understanding and agrees to plan of care.       Servando Salinaatherine H Rossi, NP 12/24/16 1844

## 2016-12-24 NOTE — ED Triage Notes (Signed)
c/O vaginal irritation x 2 days with "whitish lumpy" discharge.  Pt reports douching last week.

## 2016-12-24 NOTE — ED Notes (Signed)
Discharged by Velia Meyercathy rossi, np

## 2017-02-12 ENCOUNTER — Ambulatory Visit (HOSPITAL_COMMUNITY)
Admission: EM | Admit: 2017-02-12 | Discharge: 2017-02-12 | Disposition: A | Discharge status: Home / Self Care | Payer: 59 | Attending: Internal Medicine | Admitting: Internal Medicine

## 2017-02-12 ENCOUNTER — Encounter (HOSPITAL_COMMUNITY): Payer: Self-pay | Admitting: Emergency Medicine

## 2017-02-12 DIAGNOSIS — J029 Acute pharyngitis, unspecified: Secondary | ICD-10-CM

## 2017-02-12 DIAGNOSIS — J020 Streptococcal pharyngitis: Secondary | ICD-10-CM | POA: Diagnosis not present

## 2017-02-12 MED ORDER — AMOXICILLIN 500 MG PO CAPS: 1000.0000 mg | ORAL_CAPSULE | Freq: Two times a day (BID) | ORAL | 0 refills | Status: DC

## 2017-02-12 MED ORDER — NAPROXEN 375 MG PO TABS: 375.0000 mg | ORAL_TABLET | Freq: Two times a day (BID) | ORAL | 0 refills | Status: DC

## 2017-02-12 NOTE — ED Triage Notes (Signed)
Fever, intermittent last week, chest congestion.  Patient has a sore throat, congestion in throat.  Patient feels tired.

## 2017-02-12 NOTE — Discharge Instructions (Signed)
Take the medication as directed. Cepacol lozenges for sore throat pain. Drink plenty of fluids and stay well-hydrated. No work or school for 24 hours, he must be on antibiotics for that period of Time.

## 2017-02-12 NOTE — ED Provider Notes (Signed)
CSN: [id number]     Arrival date & time 02/12/17  1018 History   First MD Initiated Contact with Patient 02/12/17 1231     Chief Complaint  Patient presents with  . Fever   (Consider location/radiation/quality/duration/timing/severity/associated sxs/prior Treatment) 31 year old female states she was recently on [place] and for 2 days she began to feel sickly. 3 days ago she developed sore throat followed by cough, feeling hot and then cold, tired and fatigued and headache with intermittent nausea but no vomiting.      History reviewed. No pertinent past medical history. Past Surgical History:  Procedure Laterality Date  . APPENDECTOMY     Family History  Problem Relation Age of Onset  . Depression Mother   . Hypertension Mother   . Hypertension Father   . Schizophrenia Sister   . Bipolar disorder Sister    Social History  Substance Use Topics  . Smoking status: Current Every Day Smoker    Packs/day: 0.50    Years: 10.00    Types: Cigarettes  . Smokeless tobacco: Not on file  . Alcohol use Yes     Comment: rare   OB History    Gravida Para Term Preterm AB Living   3 2     1      SAB TAB Ectopic Multiple Live Births   1             Review of Systems  Constitutional: Positive for chills, fatigue and fever.  HENT: Positive for congestion, postnasal drip and sore throat.   Eyes: Negative.   Respiratory: Positive for cough.   Cardiovascular: Negative.   Gastrointestinal: Negative.   Neurological: Negative.   All other systems reviewed and are negative.   Allergies  Patient has no known allergies.  Home Medications   Prior to Admission medications   Medication Sig Start Date End Date Taking? Authorizing Provider  guaiFENesin (MUCINEX) 600 MG 12 hr tablet Take 1 tablet (600 mg total) by mouth 2 (two) times daily as needed. 11/06/15  Yes Sam, Serena Y, PA-C  acetaminophen (TYLENOL) 325 MG tablet Take 2 tablets (650 mg total) by mouth every 6 (six)  hours as needed. 11/06/15   Sam, Ace GinsSerena Y, PA-C  amoxicillin (AMOXIL) 500 MG capsule Take 2 capsules (1,000 mg total) by mouth 2 (two) times daily. 02/12/17   Hayden RasmussenMabe, Ishitha Roper, NP  naproxen (NAPROSYN) 375 MG tablet Take 1 tablet (375 mg total) by mouth 2 (two) times daily. 02/12/17   Hayden RasmussenMabe, Margeret Stachnik, NP   Meds Ordered and Administered this Visit  Medications - No data to display  BP (!) 143/79 (BP Location: Right Arm)   Pulse (!) 101 Comment: rn notified  Temp 99 F (37.2 C) (Oral)   Resp 16   LMP 01/22/2017   SpO2 98%  No data found.   Physical Exam  Constitutional: She is oriented to person, place, and time. She appears well-developed and well-nourished. No distress.  HENT:  Mouth/Throat: Oropharyngeal exudate present.  Bilateral TMs are normal. Oropharynx with dark erythema involving the soft palate, enlarged cryptic red tonsils covered with multiple exudates. Airway is widely patent.  Eyes: EOM are normal.  Neck: Normal range of motion. Neck supple.  Cardiovascular: Normal rate, regular rhythm, normal heart sounds and intact distal pulses.   Pulmonary/Chest: Effort normal.  Tidal volume without adventitious sounds. With cough there is some coarseness similar to that of a smoker's cough. The patient does smoke.  Lymphadenopathy:    She has  no cervical adenopathy.  Neurological: She is alert and oriented to person, place, and time.  Skin: Skin is warm and dry. No rash noted.  Psychiatric: She has a normal mood and affect. Her behavior is normal.  Nursing note and vitals reviewed.   Urgent Care Course     Procedures (including critical care time)  Labs Review Labs Reviewed - No data to display  Imaging Review No results found.   Visual Acuity Review  Right Eye Distance:   Left Eye Distance:   Bilateral Distance:    Right Eye Near:   Left Eye Near:    Bilateral Near:         MDM   1. Exudative pharyngitis   2. Strep pharyngitis    Take the medication as directed.  Cepacol lozenges for sore throat pain. Drink plenty of fluids and stay well-hydrated. No work or school for 24 hours, you must be on antibiotics for that period of Time. Meds ordered this encounter  Medications  . amoxicillin (AMOXIL) 500 MG capsule    Sig: Take 2 capsules (1,000 mg total) by mouth 2 (two) times daily.    Dispense:  32 capsule    Refill:  0    Order Specific Question:   Supervising Provider    Answer:   Eustace Moore [[id number]]  . naproxen (NAPROSYN) 375 MG tablet    Sig: Take 1 tablet (375 mg total) by mouth 2 (two) times daily.    Dispense:  20 tablet    Refill:  0    Order Specific Question:   Supervising Provider    Answer:   Eustace Moore [[id number]]        Hayden Rasmussen, NP 02/12/17 1250

## 2017-08-02 ENCOUNTER — Encounter: Payer: Self-pay | Admitting: Adult Health

## 2017-08-02 ENCOUNTER — Ambulatory Visit (INDEPENDENT_AMBULATORY_CARE_PROVIDER_SITE_OTHER): Payer: 59 | Admitting: Adult Health

## 2017-08-02 VITALS — BP 130/90 | Temp 98.3°F | Ht 71.0 in | Wt 383.0 lb

## 2017-08-02 DIAGNOSIS — Z7689 Persons encountering health services in other specified circumstances: Secondary | ICD-10-CM

## 2017-08-02 DIAGNOSIS — Z720 Tobacco use: Secondary | ICD-10-CM | POA: Diagnosis not present

## 2017-08-02 DIAGNOSIS — G43811 Other migraine, intractable, with status migrainosus: Secondary | ICD-10-CM | POA: Diagnosis not present

## 2017-08-02 MED ORDER — VARENICLINE TARTRATE 0.5 MG X 11 & 1 MG X 42 PO MISC: ORAL | 0 refills | Status: DC

## 2017-08-02 NOTE — Progress Notes (Signed)
Patient presents to clinic today to establish care. She is a pleasant 31 year old female who  has no past medical history on file.   She is unsure of when her last physical was... Possibly 2016 or 2017    Acute Concerns: Establish Care   Chronic Issues: Obesity - she tries to eat healthy but does not exercise at all. She would like information on options to help her lose weight. She has an appointment this month with [facility] to discuss bariatric surgery but she is not crazy about the idea of surgical interventions   Smoking Cessation - she smokes about one pack every 3 days. Used Chantix many years ago. Would like to go back on this medication to help her quit smoking   Headaches - she has been having headaches more frequently lately. She thought that it may be due to high blood pressure so she started taking her mothers blood pressure medication ( unsure of which type), she reports that her headaches resolved after taking medication. She was not checking her blood pressure at home while doing this   Health Maintenance: Dental -- Does not do routine care Vision -- Does not do routine care  Immunizations -- UTD  Colonoscopy -- Never had  Mammogram -- never had  PAP -- She is due in 2019. She does not have a GYN  Diet: She does not follow a specific diet. She tries to eat healthy.  Exercise: Does not exercise     No past medical history on file.  Past Surgical History:  Procedure Laterality Date  . APPENDECTOMY      No current outpatient prescriptions on file prior to visit.   No current facility-administered medications on file prior to visit.     No Known Allergies  Family History  Problem Relation Age of Onset  . Depression Mother   . Hypertension Mother   . Hypertension Father   . Schizophrenia Sister   . Bipolar disorder Sister     Social History   Social History  . Marital status: Single    Spouse name: N/A  . Number of children: N/A    . Years of education: N/A   Occupational History  . Not on file.   Social History Main Topics  . Smoking status: Current Every Day Smoker    Packs/day: 0.50    Years: 10.00    Types: Cigarettes  . Smokeless tobacco: Never Used  . Alcohol use Yes     Comment: rare  . Drug use: No  . Sexual activity: Yes    Partners: Male    Birth control/ protection: None   Other Topics Concern  . Not on file   Social History Narrative   Works at [employer]    Review of Systems  Constitutional: Negative.   HENT: Negative.   Eyes: Negative.   Respiratory: Negative.   Cardiovascular: Negative.   Gastrointestinal: Negative.   Genitourinary: Negative.   Musculoskeletal: Negative.   Skin: Negative.   Neurological: Negative.   Psychiatric/Behavioral: Negative.   All other systems reviewed and are negative.   BP 130/90 (BP Location: Left Wrist)   Temp 98.3 F (36.8 C) (Oral)   Ht 5\' 11"  (1.803 m)   Wt (!) 383 lb (173.7 kg)   LMP 07/02/2017 (Within Days)   BMI 53.42 kg/m   Physical Exam  Constitutional: She is oriented to person, place, and time and well-developed, well-nourished, and in no distress. No distress.  Morbidly obese   Eyes: Pupils are equal, round, and reactive to light. Conjunctivae and EOM are normal. Right eye exhibits no discharge. Left eye exhibits no discharge. No scleral icterus.  Neck: Normal range of motion. Neck supple. No JVD present. No tracheal deviation present. No thyromegaly present.  Cardiovascular: Normal rate, regular rhythm, normal heart sounds and intact distal pulses.  Exam reveals no gallop and no friction rub.   No murmur heard. Pulmonary/Chest: Effort normal and breath sounds normal. No stridor. No respiratory distress. She has no wheezes. She has no rales. She exhibits no tenderness.  Abdominal: She exhibits no mass.  Musculoskeletal: Normal range of motion. She exhibits no edema, tenderness or deformity.  Lymphadenopathy:    She has  no cervical adenopathy.  Neurological: She is alert and oriented to person, place, and time. Gait normal. GCS score is 15.  Skin: Skin is warm and dry. No rash noted. She is not diaphoretic. No erythema. No pallor.  Psychiatric: Mood, memory, affect and judgment normal.  Nursing note and vitals reviewed.   Assessment/Plan:  1. Encounter to establish care - Follow up in one month for CPE  - Follow up sooner for any acute issue   2. Morbid obesity (HCC) - Needs to cut out salt and sugar  - Start exercising  - Amb Ref to Medical Weight Management  3. Other migraine with status migrainosus, intractable - Advised to keep a blood pressure log  - Do not take other peoples prescriptions   4. Tobacco use Trial of chantix. Common side effects including rare risk of suicide ideation was discussed with the patient today.  Patient is instructed to go directly to the ED if this occurs.  We discussed that patient can continue to smoke for 1 week after starting chantix, but then must discontinue cigarettes.  He is also instructed to contact us prior to completion of the starter month pack for an rx for the continuation month pack.  5 minutes spent with patient today on tobacco cessation counseling.  - varenicline (CHANTIX STARTING MONTH PAK) 0.5 MG X 11 & 1 MG X 42 tablet; Take one 0.5 mg tablet by mouth once daily for 3 days, then increase to one 0.5 mg tablet twice daily for 4 days, then increase to one 1 mg tablet twice daily.  Dispense: 53 tablet; Refill: 0   Shirline Freesory Neftali Abair, NP

## 2017-08-02 NOTE — Patient Instructions (Signed)
Please follow up with me for your physical in one month   Start chantix   Someone will call yo to schedule your appointment for the weight loss clinic

## 2017-08-03 ENCOUNTER — Telehealth: Payer: Self-pay | Admitting: Adult Health

## 2017-08-03 NOTE — Telephone Encounter (Signed)
PA approved, form faxed back to pharmacy. 

## 2017-08-03 NOTE — Telephone Encounter (Signed)
Received PA request for Chantix. PA submitted & pending. Key: [id number]

## 2017-08-06 NOTE — Telephone Encounter (Signed)
PA approved, form faxed back to pharmacy. 

## 2017-08-07 ENCOUNTER — Other Ambulatory Visit (HOSPITAL_COMMUNITY): Payer: Self-pay | Admitting: General Surgery

## 2017-08-09 ENCOUNTER — Encounter: Payer: 59 | Attending: General Surgery | Admitting: Skilled Nursing Facility1

## 2017-08-09 ENCOUNTER — Encounter: Payer: Self-pay | Admitting: Skilled Nursing Facility1

## 2017-08-09 DIAGNOSIS — Z713 Dietary counseling and surveillance: Secondary | ICD-10-CM | POA: Diagnosis not present

## 2017-08-09 NOTE — Progress Notes (Signed)
Pre-Op Assessment Visit:  Pre-Operative Sleeve or RYGB Surgery  Medical Nutrition Therapy:  Appt start time: 8:45  End time:  9:40  Patient was seen on 08/09/2017 for Pre-Operative Nutrition Assessment. Assessment and letter of approval [facility] to [facility] coordinator on 08/09/2017.   For now 6 months SWL but will switch to Vanuatucigna so maybe 3 SWL. Will start workout classes on the weekend. Pt states she is always so tired currently working overtime.   Pt expectation of surgery: more conscious of what eating and changed physical activity and being more responsible   Pt expectation of Dietitian: to help me set my goals and have guidance   Start weight at [facility]: 379.2 BMI: 56.19   24 hr Dietary Recall: First Meal: 2 eggs with cheese with sour cream and salsa---skipped Snack: pretzels and chips Second Meal: subway meatball sub  Snack: chips or pretzels Third Meal: chicken or shrimp with rice and broccoli or [employer] mix Snack:  Beverages: water, coffee, hot coco, soda  Encouraged to engage in 150 minutes of moderate physical activity including cardiovascular and weight baring weekly  Handouts given during visit include:  . Pre-Op Goals . Bariatric Surgery Protein Shakes During the appointment today the following Pre-Op Goals were reviewed with the patient: . Maintain or lose weight as instructed by your surgeon . Make healthy food choices . Begin to limit portion sizes . Limited concentrated sugars and fried foods . Keep fat/sugar in the single digits per serving on             food labels . Practice CHEWING your food  (aim for 30 chews per bite or until applesauce consistency) . Practice not drinking 15 minutes before, during, and 30 minutes after each meal/snack . Avoid all carbonated beverages  . Avoid/limit caffeinated beverages  . Avoid all sugar-sweetened beverages . Consume 3 meals per day; eat every 3-5 hours . Make a list of  non-food related activities . Aim for 64-100 ounces of FLUID daily  . Aim for at least 60-80 grams of PROTEIN daily . Look for a liquid protein source that contain ?15 g protein and ?5 g carbohydrate  (ex: shakes, drinks, shots)  -Follow diet recommendations listed below   Energy and Macronutrient Recomendations: Calories: 1800 Carbohydrate: 200 Protein: 135 Fat: 50  Demonstrated degree of understanding via:  Teach Back  Teaching Method Utilized:  Visual Auditory Hands on  Barriers to learning/adherence to lifestyle change: none identified   Patient to call the [facility] Education [facility] to enroll in Pre-Op and Post-Op Nutrition Education when surgery date is scheduled.

## 2017-08-16 ENCOUNTER — Other Ambulatory Visit: Payer: Self-pay

## 2017-08-16 ENCOUNTER — Inpatient Hospital Stay (HOSPITAL_COMMUNITY): Payer: 59 | Admitting: Anesthesiology

## 2017-08-16 ENCOUNTER — Inpatient Hospital Stay (HOSPITAL_COMMUNITY)
Admission: AD | Admit: 2017-08-16 | Discharge: 2017-08-18 | DRG: 818 | Disposition: A | Discharge status: Home / Self Care | Payer: 59 | Source: Ambulatory Visit | Attending: Obstetrics and Gynecology | Admitting: Obstetrics and Gynecology

## 2017-08-16 ENCOUNTER — Inpatient Hospital Stay (HOSPITAL_COMMUNITY): Payer: 59

## 2017-08-16 ENCOUNTER — Encounter (HOSPITAL_COMMUNITY): Payer: Self-pay | Admitting: *Deleted

## 2017-08-16 ENCOUNTER — Encounter (HOSPITAL_COMMUNITY)
Admission: AD | Disposition: A | Discharge status: Home / Self Care | Payer: Self-pay | Source: Ambulatory Visit | Attending: Obstetrics and Gynecology

## 2017-08-16 DIAGNOSIS — O00102 Left tubal pregnancy without intrauterine pregnancy: Secondary | ICD-10-CM

## 2017-08-16 DIAGNOSIS — F17210 Nicotine dependence, cigarettes, uncomplicated: Secondary | ICD-10-CM | POA: Diagnosis present

## 2017-08-16 DIAGNOSIS — O009 Unspecified ectopic pregnancy without intrauterine pregnancy: Secondary | ICD-10-CM | POA: Diagnosis present

## 2017-08-16 DIAGNOSIS — N9971 Accidental puncture and laceration of a genitourinary system organ or structure during a genitourinary system procedure: Secondary | ICD-10-CM | POA: Diagnosis present

## 2017-08-16 DIAGNOSIS — N9981 Other intraoperative complications of genitourinary system: Secondary | ICD-10-CM | POA: Diagnosis not present

## 2017-08-16 DIAGNOSIS — O209 Hemorrhage in early pregnancy, unspecified: Secondary | ICD-10-CM

## 2017-08-16 DIAGNOSIS — Z6841 Body Mass Index (BMI) 40.0 and over, adult: Secondary | ICD-10-CM | POA: Diagnosis not present

## 2017-08-16 DIAGNOSIS — N736 Female pelvic peritoneal adhesions (postinfective): Secondary | ICD-10-CM | POA: Diagnosis not present

## 2017-08-16 DIAGNOSIS — S3720XA Unspecified injury of bladder, initial encounter: Secondary | ICD-10-CM

## 2017-08-16 HISTORY — DX: Urinary tract infection, site not specified: N39.0

## 2017-08-16 HISTORY — DX: Essential (primary) hypertension: I10

## 2017-08-16 HISTORY — PX: LAPAROTOMY: SHX154

## 2017-08-16 HISTORY — PX: CYSTOSCOPY: SHX5120

## 2017-08-16 HISTORY — PX: UNILATERAL SALPINGECTOMY: SHX6160

## 2017-08-16 HISTORY — PX: LYSIS OF ADHESION: SHX5961

## 2017-08-16 HISTORY — PX: LAPAROSCOPY: SHX197

## 2017-08-16 HISTORY — DX: Herpesviral infection, unspecified: B00.9

## 2017-08-16 HISTORY — DX: Encounter for elective termination of pregnancy: Z33.2

## 2017-08-16 LAB — URINALYSIS, ROUTINE W REFLEX MICROSCOPIC
Bacteria, UA: NONE SEEN
Bilirubin Urine: NEGATIVE
GLUCOSE, UA: NEGATIVE mg/dL
Ketones, ur: NEGATIVE mg/dL
Leukocytes, UA: NEGATIVE
Nitrite: NEGATIVE
PH: 7 (ref 5.0–8.0)
Protein, ur: NEGATIVE mg/dL
Specific Gravity, Urine: 1.012 (ref 1.005–1.030)

## 2017-08-16 LAB — CBC
HEMATOCRIT: 34.6 % — AB (ref 36.0–46.0)
Hemoglobin: 11.1 g/dL — ABNORMAL LOW (ref 12.0–15.0)
MCH: 29.1 pg (ref 26.0–34.0)
MCHC: 32.1 g/dL (ref 30.0–36.0)
MCV: 90.8 fL (ref 78.0–100.0)
PLATELETS: 278 10*3/uL (ref 150–400)
RBC: 3.81 MIL/uL — AB (ref 3.87–5.11)
RDW: 17.7 % — ABNORMAL HIGH (ref 11.5–15.5)
WBC: 10 10*3/uL (ref 4.0–10.5)

## 2017-08-16 LAB — COMPREHENSIVE METABOLIC PANEL
ALT: 66 U/L — AB (ref 14–54)
AST: 45 U/L — AB (ref 15–41)
Albumin: 3.3 g/dL — ABNORMAL LOW (ref 3.5–5.0)
Alkaline Phosphatase: 37 U/L — ABNORMAL LOW (ref 38–126)
Anion gap: 5 (ref 5–15)
BILIRUBIN TOTAL: 0.5 mg/dL (ref 0.3–1.2)
BUN: 5 mg/dL — AB (ref 6–20)
CO2: 26 mmol/L (ref 22–32)
CREATININE: 0.74 mg/dL (ref 0.44–1.00)
Calcium: 8.7 mg/dL — ABNORMAL LOW (ref 8.9–10.3)
Chloride: 103 mmol/L (ref 101–111)
Glucose, Bld: 104 mg/dL — ABNORMAL HIGH (ref 65–99)
Potassium: 4 mmol/L (ref 3.5–5.1)
Sodium: 134 mmol/L — ABNORMAL LOW (ref 135–145)
Total Protein: 6.6 g/dL (ref 6.5–8.1)

## 2017-08-16 LAB — POCT PREGNANCY, URINE: Preg Test, Ur: POSITIVE — AB

## 2017-08-16 LAB — CREATININE, SERUM
CREATININE: 0.64 mg/dL (ref 0.44–1.00)
GFR calc Af Amer: >60 mL/min (ref >60)

## 2017-08-16 LAB — PREPARE RBC (CROSSMATCH)

## 2017-08-16 LAB — HCG, QUANTITATIVE, PREGNANCY: hCG, Beta Chain, Quant, S: 1729 m[IU]/mL — ABNORMAL HIGH (ref <5)

## 2017-08-16 LAB — HIV ANTIBODY (ROUTINE TESTING W REFLEX): HIV SCREEN 4TH GENERATION: NONREACTIVE

## 2017-08-16 SURGERY — LAPAROTOMY, EXPLORATORY
Anesthesia: General | Site: Bladder

## 2017-08-16 MED ORDER — MIDAZOLAM HCL 2 MG/2ML IJ SOLN
INTRAMUSCULAR | Status: AC
  Filled 2017-08-16: qty 2

## 2017-08-16 MED ORDER — MEPERIDINE HCL 25 MG/ML IJ SOLN: 6.2500 mg | INTRAMUSCULAR | Status: DC | PRN

## 2017-08-16 MED ORDER — DEXAMETHASONE SODIUM PHOSPHATE 10 MG/ML IJ SOLN
INTRAMUSCULAR | Status: DC | PRN
  Administered 2017-08-16: 10 mg via INTRAVENOUS

## 2017-08-16 MED ORDER — SUGAMMADEX SODIUM 200 MG/2ML IV SOLN
INTRAVENOUS | Status: DC | PRN
  Administered 2017-08-16: 360 mg via INTRAVENOUS

## 2017-08-16 MED ORDER — FENTANYL CITRATE (PF) 250 MCG/5ML IJ SOLN
INTRAMUSCULAR | Status: AC
  Filled 2017-08-16: qty 5

## 2017-08-16 MED ORDER — ENOXAPARIN SODIUM 40 MG/0.4ML ~~LOC~~ SOLN
40.0000 mg | SUBCUTANEOUS | Status: DC
  Administered 2017-08-17: 40 mg via SUBCUTANEOUS
  Filled 2017-08-16: qty 0.4

## 2017-08-16 MED ORDER — SUCCINYLCHOLINE CHLORIDE 20 MG/ML IJ SOLN
INTRAMUSCULAR | Status: DC | PRN
  Administered 2017-08-16: 140 mg via INTRAVENOUS

## 2017-08-16 MED ORDER — MEPERIDINE HCL 25 MG/ML IJ SOLN
6.2500 mg | INTRAMUSCULAR | Status: DC | PRN
  Administered 2017-08-16 (×2): 12.5 mg via INTRAVENOUS

## 2017-08-16 MED ORDER — FENTANYL CITRATE (PF) 100 MCG/2ML IJ SOLN
25.0000 ug | INTRAMUSCULAR | Status: DC | PRN
Start: 2017-08-16 — End: 2017-08-16

## 2017-08-16 MED ORDER — FENTANYL CITRATE (PF) 100 MCG/2ML IJ SOLN: 25.0000 ug | INTRAMUSCULAR | Status: DC | PRN

## 2017-08-16 MED ORDER — ONDANSETRON HCL 4 MG PO TABS: 4.0000 mg | ORAL_TABLET | Freq: Four times a day (QID) | ORAL | Status: DC | PRN

## 2017-08-16 MED ORDER — LACTATED RINGERS IV SOLN: INTRAVENOUS | Status: DC

## 2017-08-16 MED ORDER — SCOPOLAMINE 1 MG/3DAYS TD PT72
1.0000 | MEDICATED_PATCH | TRANSDERMAL | Status: DC
  Administered 2017-08-16: 1.5 mg via TRANSDERMAL

## 2017-08-16 MED ORDER — PHENYLEPHRINE HCL 10 MG/ML IJ SOLN
INTRAMUSCULAR | Status: DC | PRN
  Administered 2017-08-16: 40 ug via INTRAVENOUS
  Administered 2017-08-16: 80 ug via INTRAVENOUS

## 2017-08-16 MED ORDER — SUCCINYLCHOLINE CHLORIDE 200 MG/10ML IV SOSY
PREFILLED_SYRINGE | INTRAVENOUS | Status: AC
  Filled 2017-08-16: qty 10

## 2017-08-16 MED ORDER — HYDROMORPHONE HCL 1 MG/ML IJ SOLN
INTRAMUSCULAR | Status: DC | PRN
  Administered 2017-08-16: 1 mg via INTRAVENOUS

## 2017-08-16 MED ORDER — SIMETHICONE 80 MG PO CHEW: 80.0000 mg | CHEWABLE_TABLET | Freq: Four times a day (QID) | ORAL | Status: DC | PRN

## 2017-08-16 MED ORDER — BISACODYL 10 MG RE SUPP: 10.0000 mg | Freq: Every day | RECTAL | Status: DC | PRN

## 2017-08-16 MED ORDER — METOCLOPRAMIDE HCL 5 MG/ML IJ SOLN: 10.0000 mg | Freq: Once | INTRAMUSCULAR | Status: DC | PRN

## 2017-08-16 MED ORDER — LIDOCAINE HCL (CARDIAC) 20 MG/ML IV SOLN
INTRAVENOUS | Status: AC
  Filled 2017-08-16: qty 5

## 2017-08-16 MED ORDER — BUPIVACAINE HCL (PF) 0.5 % IJ SOLN
INTRAMUSCULAR | Status: DC | PRN
  Administered 2017-08-16: 30 mL

## 2017-08-16 MED ORDER — PHENYLEPHRINE 40 MCG/ML (10ML) SYRINGE FOR IV PUSH (FOR BLOOD PRESSURE SUPPORT)
PREFILLED_SYRINGE | INTRAVENOUS | Status: AC
  Filled 2017-08-16: qty 10

## 2017-08-16 MED ORDER — MAGNESIUM HYDROXIDE 400 MG/5ML PO SUSP: 30.0000 mL | Freq: Every day | ORAL | Status: DC | PRN

## 2017-08-16 MED ORDER — FENTANYL CITRATE (PF) 100 MCG/2ML IJ SOLN
INTRAMUSCULAR | Status: DC | PRN
  Administered 2017-08-16: 50 ug via INTRAVENOUS
  Administered 2017-08-16: 100 ug via INTRAVENOUS
  Administered 2017-08-16 (×3): 50 ug via INTRAVENOUS
  Administered 2017-08-16: 100 ug via INTRAVENOUS
  Administered 2017-08-16 (×2): 50 ug via INTRAVENOUS
  Administered 2017-08-16 (×2): 100 ug via INTRAVENOUS
  Administered 2017-08-16: 50 ug via INTRAVENOUS

## 2017-08-16 MED ORDER — IBUPROFEN 800 MG PO TABS: 800.0000 mg | ORAL_TABLET | Freq: Three times a day (TID) | ORAL | Status: DC | PRN

## 2017-08-16 MED ORDER — ONDANSETRON HCL 4 MG/2ML IJ SOLN
INTRAMUSCULAR | Status: DC | PRN
  Administered 2017-08-16: 4 mg via INTRAVENOUS

## 2017-08-16 MED ORDER — ROCURONIUM BROMIDE 100 MG/10ML IV SOLN
INTRAVENOUS | Status: DC | PRN
  Administered 2017-08-16: 10 mg via INTRAVENOUS
  Administered 2017-08-16: 30 mg via INTRAVENOUS
  Administered 2017-08-16: 10 mg via INTRAVENOUS
  Administered 2017-08-16 (×2): 20 mg via INTRAVENOUS
  Administered 2017-08-16: 10 mg via INTRAVENOUS
  Administered 2017-08-16: 20 mg via INTRAVENOUS

## 2017-08-16 MED ORDER — FENTANYL CITRATE (PF) 100 MCG/2ML IJ SOLN
INTRAMUSCULAR | Status: AC
  Administered 2017-08-16: 50 ug
  Filled 2017-08-16: qty 2

## 2017-08-16 MED ORDER — LIDOCAINE HCL (CARDIAC) 20 MG/ML IV SOLN
INTRAVENOUS | Status: DC | PRN
  Administered 2017-08-16: 100 mg via INTRAVENOUS

## 2017-08-16 MED ORDER — HYDROMORPHONE HCL 1 MG/ML IJ SOLN
0.5000 mg | INTRAMUSCULAR | Status: DC | PRN
  Administered 2017-08-16 (×2): 0.5 mg via INTRAVENOUS

## 2017-08-16 MED ORDER — DEXAMETHASONE SODIUM PHOSPHATE 10 MG/ML IJ SOLN
INTRAMUSCULAR | Status: AC
  Filled 2017-08-16: qty 1

## 2017-08-16 MED ORDER — ONDANSETRON HCL 4 MG/2ML IJ SOLN
INTRAMUSCULAR | Status: AC
  Filled 2017-08-16: qty 2

## 2017-08-16 MED ORDER — STERILE MILK OPTIME
Status: DC | PRN
  Administered 2017-08-16: 2180 mL via INTRAVESICAL

## 2017-08-16 MED ORDER — ONDANSETRON HCL 4 MG/2ML IJ SOLN: 4.0000 mg | Freq: Four times a day (QID) | INTRAMUSCULAR | Status: DC | PRN

## 2017-08-16 MED ORDER — HYDROMORPHONE HCL 1 MG/ML IJ SOLN
INTRAMUSCULAR | Status: AC
  Filled 2017-08-16: qty 1

## 2017-08-16 MED ORDER — DEXTROSE 5 % IV SOLN
INTRAVENOUS | Status: AC
  Filled 2017-08-16: qty 3000

## 2017-08-16 MED ORDER — PANTOPRAZOLE SODIUM 40 MG PO TBEC
40.0000 mg | DELAYED_RELEASE_TABLET | Freq: Every day | ORAL | Status: DC
  Administered 2017-08-17 – 2017-08-18 (×3): 40 mg via ORAL
  Filled 2017-08-16 (×3): qty 1

## 2017-08-16 MED ORDER — MIDAZOLAM HCL 2 MG/2ML IJ SOLN
INTRAMUSCULAR | Status: DC | PRN
  Administered 2017-08-16: 2 mg via INTRAVENOUS

## 2017-08-16 MED ORDER — ROCURONIUM BROMIDE 100 MG/10ML IV SOLN
INTRAVENOUS | Status: AC
  Filled 2017-08-16: qty 1

## 2017-08-16 MED ORDER — HYDROMORPHONE HCL 1 MG/ML IJ SOLN
1.0000 mg | Freq: Once | INTRAMUSCULAR | Status: AC
  Administered 2017-08-16: 1 mg via INTRAMUSCULAR
  Filled 2017-08-16: qty 1

## 2017-08-16 MED ORDER — BUPIVACAINE HCL (PF) 0.5 % IJ SOLN
INTRAMUSCULAR | Status: AC
  Filled 2017-08-16: qty 30

## 2017-08-16 MED ORDER — KETOROLAC TROMETHAMINE 30 MG/ML IJ SOLN
30.0000 mg | Freq: Once | INTRAMUSCULAR | Status: AC
  Administered 2017-08-17: 30 mg via INTRAVENOUS
  Filled 2017-08-16: qty 1

## 2017-08-16 MED ORDER — PROPOFOL 10 MG/ML IV BOLUS
INTRAVENOUS | Status: AC
  Filled 2017-08-16: qty 20

## 2017-08-16 MED ORDER — DOCUSATE SODIUM 100 MG PO CAPS
100.0000 mg | ORAL_CAPSULE | Freq: Two times a day (BID) | ORAL | Status: DC
  Administered 2017-08-17 – 2017-08-18 (×4): 100 mg via ORAL
  Filled 2017-08-16 (×4): qty 1

## 2017-08-16 MED ORDER — DEXTROSE 5 % IV SOLN
INTRAVENOUS | Status: DC | PRN
  Administered 2017-08-16: 3 g via INTRAVENOUS

## 2017-08-16 MED ORDER — KETOROLAC TROMETHAMINE 30 MG/ML IJ SOLN
INTRAMUSCULAR | Status: DC | PRN
  Administered 2017-08-16: 30 mg via INTRAVENOUS

## 2017-08-16 MED ORDER — OXYCODONE-ACETAMINOPHEN 5-325 MG PO TABS
1.0000 | ORAL_TABLET | ORAL | Status: DC | PRN
  Administered 2017-08-17: 1 via ORAL
  Administered 2017-08-17 (×2): 2 via ORAL
  Administered 2017-08-17: 1 via ORAL
  Administered 2017-08-18 (×3): 2 via ORAL
  Filled 2017-08-16 (×3): qty 2
  Filled 2017-08-16: qty 1
  Filled 2017-08-16: qty 2
  Filled 2017-08-16: qty 1
  Filled 2017-08-16: qty 2

## 2017-08-16 MED ORDER — PROPOFOL 10 MG/ML IV BOLUS
INTRAVENOUS | Status: DC | PRN
  Administered 2017-08-16: 150 mg via INTRAVENOUS

## 2017-08-16 MED ORDER — MEPERIDINE HCL 25 MG/ML IJ SOLN
INTRAMUSCULAR | Status: AC
  Filled 2017-08-16: qty 1

## 2017-08-16 MED ORDER — SODIUM CHLORIDE 0.9 % IR SOLN
Status: DC | PRN
  Administered 2017-08-16: 3000 mL

## 2017-08-16 MED ORDER — MORPHINE SULFATE (PF) 4 MG/ML IV SOLN: 1.0000 mg | INTRAVENOUS | Status: DC | PRN

## 2017-08-16 MED ORDER — SODIUM CHLORIDE 0.9 % IV SOLN: Freq: Once | INTRAVENOUS | Status: DC

## 2017-08-16 MED ORDER — LACTATED RINGERS IV SOLN
INTRAVENOUS | Status: DC
  Administered 2017-08-16 (×4): via INTRAVENOUS

## 2017-08-16 MED ORDER — KETOROLAC TROMETHAMINE 30 MG/ML IJ SOLN
INTRAMUSCULAR | Status: AC
  Filled 2017-08-16: qty 1

## 2017-08-16 MED ORDER — SCOPOLAMINE 1 MG/3DAYS TD PT72
MEDICATED_PATCH | TRANSDERMAL | Status: AC
  Filled 2017-08-16: qty 1

## 2017-08-16 SURGICAL SUPPLY — 61 items
ADH SKN CLS APL DERMABOND .7 (GAUZE/BANDAGES/DRESSINGS) ×4
APL SKNCLS STERI-STRIP NONHPOA (GAUZE/BANDAGES/DRESSINGS) ×4
BAG SPEC RTRVL LRG 6X4 10 (ENDOMECHANICALS) ×4
BENZOIN TINCTURE PRP APPL 2/3 (GAUZE/BANDAGES/DRESSINGS) ×2 IMPLANT
CABLE HIGH FREQUENCY MONO STRZ (ELECTRODE) ×2 IMPLANT
CATH ROBINSON RED A/P 16FR (CATHETERS) IMPLANT
COVER MAYO STAND STRL (DRAPES) ×2 IMPLANT
COVER TABLE BACK 60X90 (DRAPES) ×2 IMPLANT
DEFOGGER SCOPE WARMER CLEARIFY (MISCELLANEOUS) ×2 IMPLANT
DERMABOND ADVANCED (GAUZE/BANDAGES/DRESSINGS) ×1
DERMABOND ADVANCED .7 DNX12 (GAUZE/BANDAGES/DRESSINGS) ×1 IMPLANT
DRAPE HALF SHEET 40X57 (DRAPES) ×2 IMPLANT
DRSG OPSITE POSTOP 3X4 (GAUZE/BANDAGES/DRESSINGS) ×2 IMPLANT
DRSG OPSITE POSTOP 4X10 (GAUZE/BANDAGES/DRESSINGS) ×2 IMPLANT
DURAPREP 26ML APPLICATOR (WOUND CARE) ×5 IMPLANT
FORMULA 20CAL 3 OZ MEAD (FORMULA) ×2 IMPLANT
GLOVE BIO SURGEON STRL SZ7 (GLOVE) ×5 IMPLANT
GLOVE BIOGEL PI IND STRL 7.0 (GLOVE) ×16 IMPLANT
GLOVE BIOGEL PI INDICATOR 7.0 (GLOVE) ×4
GOWN STRL REUS W/TWL LRG LVL3 (GOWN DISPOSABLE) ×10 IMPLANT
GOWN STRL REUS W/TWL XL LVL3 (GOWN DISPOSABLE) ×5 IMPLANT
MANIPULATOR UTERINE 4.5 ZUMI (MISCELLANEOUS) ×5 IMPLANT
NDL HYPO 18GX1.5 BLUNT FILL (NEEDLE) IMPLANT
NEEDLE HYPO 18GX1.5 BLUNT FILL (NEEDLE) ×5 IMPLANT
NEEDLE INSUFFLATION 120MM (ENDOMECHANICALS) ×3 IMPLANT
NS IRRIG 1000ML POUR BTL (IV SOLUTION) ×5 IMPLANT
PACK LAPAROSCOPY BASIN (CUSTOM PROCEDURE TRAY) ×5 IMPLANT
PACK TRENDGUARD 450 HYBRID PRO (MISCELLANEOUS) IMPLANT
PACK TRENDGUARD 600 HYBRD PROC (MISCELLANEOUS) ×1 IMPLANT
PENCIL BUTTON HOLSTER BLD 10FT (ELECTRODE) ×4 IMPLANT
POUCH SPECIMEN RETRIEVAL 10MM (ENDOMECHANICALS) ×2 IMPLANT
PROTECTOR NERVE ULNAR (MISCELLANEOUS) ×10 IMPLANT
SET IRRIG TUBING LAPAROSCOPIC (IRRIGATION / IRRIGATOR) ×2 IMPLANT
SHEARS HARMONIC ACE PLUS 36CM (ENDOMECHANICALS) ×2 IMPLANT
SLEEVE XCEL OPT CAN 5 100 (ENDOMECHANICALS) ×3 IMPLANT
SPONGE LAP 18X18 X RAY DECT (DISPOSABLE) ×4 IMPLANT
STRIP CLOSURE SKIN 1/2X4 (GAUZE/BANDAGES/DRESSINGS) ×2 IMPLANT
SUT PDS AB 0 CTX 60 (SUTURE) ×2 IMPLANT
SUT VIC AB 0 CT1 18XCR BRD8 (SUTURE) ×1 IMPLANT
SUT VIC AB 0 CT1 8-18 (SUTURE) ×5
SUT VIC AB 3-0 CT1 27 (SUTURE) ×10
SUT VIC AB 3-0 CT1 TAPERPNT 27 (SUTURE) ×2 IMPLANT
SUT VIC AB 3-0 SH 27 (SUTURE) ×15
SUT VIC AB 3-0 SH 27X BRD (SUTURE) ×3 IMPLANT
SUT VIC AB 3-0 X1 27 (SUTURE) ×5 IMPLANT
SUT VICRYL 0 UR6 27IN ABS (SUTURE) ×8 IMPLANT
SUT VICRYL 4-0 PS2 18IN ABS (SUTURE) ×5 IMPLANT
SYR 10ML LL (SYRINGE) ×5 IMPLANT
SYRINGE 60CC LL (MISCELLANEOUS) ×2 IMPLANT
TOWEL OR 17X24 6PK STRL BLUE (TOWEL DISPOSABLE) ×10 IMPLANT
TRAY FOLEY CATH SILVER 14FR (SET/KITS/TRAYS/PACK) ×5 IMPLANT
TRENDGUARD 450 HYBRID PRO PACK (MISCELLANEOUS)
TRENDGUARD 600 HYBRID PROC PK (MISCELLANEOUS) ×5
TROCAR 5M 150ML BLDLS (TROCAR) ×4 IMPLANT
TROCAR BLADELESS OPT 12M 150M (ENDOMECHANICALS) ×2 IMPLANT
TROCAR OPTI TIP 5M 100M (ENDOMECHANICALS) ×3 IMPLANT
TROCAR XCEL DIL TIP R 11M (ENDOMECHANICALS) ×3 IMPLANT
TROCAR XCEL NON-BLD 5MMX100MML (ENDOMECHANICALS) ×3 IMPLANT
TUBING CONNECTOR 18X5MM (MISCELLANEOUS) ×2 IMPLANT
WATER STERILE IRR 1000ML POUR (IV SOLUTION) ×2 IMPLANT
YANKAUER SUCT BULB TIP NO VENT (SUCTIONS) ×2 IMPLANT

## 2017-08-16 NOTE — Anesthesia Procedure Notes (Signed)
Procedure Name: Intubation Date/Time: 08/16/2017 3:08 PM Performed by: Genevie Ann, CRNA Pre-anesthesia Checklist: Patient identified, Emergency Drugs available, Suction available, Patient being monitored and Timeout performed Oxygen Delivery Method: Circle system utilized Preoxygenation: Pre-oxygenation with 100% oxygen Induction Type: IV induction Ventilation: Two handed mask ventilation required Laryngoscope Size: Mac and 3 Grade View: Grade II Tube size: 7.0 mm Number of attempts: 2 Airway Equipment and Method: Patient positioned with wedge pillow Placement Confirmation: ETT inserted through vocal cords under direct vision and breath sounds checked- equal and bilateral Secured at: 22 cm Dental Injury: Teeth and Oropharynx as per pre-operative assessment

## 2017-08-16 NOTE — H&P (Signed)
OB/GYN History and Physical  Carrie Shaw is a 31 y.o. [id number] presenting for worsening cramping today and heavy bleeding x 4-5 days. Reports she had irregular bleeding in Sept and Oct that was not like a normal period for her, then took plan B around [employer]. She reports heavy bleeding, approx 1 tampon per hour since Sunday but had worsening cramping today so presented to MAU. Ddi not know she was pregnant prior to arrival. Denies other complaints. Pain is mostly midline with occasional twinges on left side.   She reports h/o hypertension with pregnancy and pre-eclampsia with most recent pregnancy but not HTN outside of pregnancy and reports she has never been on meds for HTN.      Past Medical History:  Diagnosis Date  . HSV infection   . Hypertension   . SVD (spontaneous vaginal delivery)    x 2  . Termination of pregnancy (fetus)    x 2  . UTI (urinary tract infection)     Past Surgical History:  Procedure Laterality Date  . APPENDECTOMY      OB History  Gravida Para Term Preterm AB Living  _0 SAB TAB Ectopic Multiple Live Births  0 2     2    # Outcome Date GA Lbr Len/2nd Weight Sex Delivery Anes PTL Lv  5 Current           4 Term 2011        LIV  3 Term 2009        LIV  2 TAB           1 TAB               Social History   Socioeconomic History  . Marital status: Single    Spouse name: None  . Number of children: None  . Years of education: None  . Highest education level: None  Social Needs  . Financial resource strain: None  . Food insecurity - worry: None  . Food insecurity - inability: None  . Transportation needs - medical: None  . Transportation needs - non-medical: None  Occupational History  . None  Tobacco Use  . Smoking status: Current Every Day Smoker    Packs/day: 0.25    Years: 10.00    Pack years: 2.50    Types: Cigarettes  . Smokeless tobacco: Never Used  Substance and Sexual Activity  . Alcohol use: Yes   Comment: rare  . Drug use: No  . Sexual activity: Yes    Partners: Male    Birth control/protection: None    Comment: pregnant  Other Topics Concern  . None  Social History Narrative   She works at [employer]    Two children   Not married       She likes to shop and travel     Family History  Problem Relation Age of Onset  . Depression Mother   . Hypertension Mother   . Hypertension Father   . Schizophrenia Sister   . Bipolar disorder Sister     Medications Prior to Admission  Medication Sig Dispense Refill Last Dose  . hydrochlorothiazide (HYDRODIURIL) 25 MG tablet Take 25 mg as needed by mouth.   08/15/2017 at Unknown time  . varenicline (CHANTIX STARTING MONTH PAK) 0.5 MG X 11 & 1 MG X 42 tablet Take one 0.5 mg tablet by mouth once daily for 3 days, then increase to one 0.5  mg tablet twice daily for 4 days, then increase to one 1 mg tablet twice daily. 53 tablet 0 Past Week at Unknown time    No Known Allergies  Review of Systems: Negative except for what is mentioned in HPI.     Physical Exam: BP 110/67   Pulse (!) 106   Temp 99.1 F (37.3 C) (Oral)   Resp 18   Ht _0  (1.803 m)   Wt (!) 399 lb (181 kg)   LMP 05/13/2017   SpO2 99%   BMI 55.65 kg/m  CONSTITUTIONAL: Well-developed, well-nourished female in no acute distress. Morbidly obese  HENT:  Normocephalic, atraumatic, External right and left ear normal. Oropharynx is clear and moist EYES: Conjunctivae and EOM are normal. Pupils are equal, round, and reactive to light. No scleral icterus.  NECK: Normal range of motion, supple, no masses SKIN: Skin is warm and dry. No rash noted. Not diaphoretic. No erythema. No pallor. [place]: Alert and oriented to person, place, and time. Normal reflexes, muscle tone coordination. No cranial nerve deficit noted. PSYCHIATRIC: Normal mood and affect. Normal behavior. Normal judgment and thought content. CARDIOVASCULAR: Normal heart rate noted, regular  rhythm RESPIRATORY: Effort and breath sounds normal, no problems with respiration noted ABDOMEN: Soft, nontender, nondistended, gravid. Well-healed open appendectomy scar noted. PELVIC: Deferred MUSCULOSKELETAL: Normal range of motion. No edema and no tenderness. 2+ distal pulses.   Pertinent Labs/Studies:   Results for orders placed or performed during the hospital encounter of 08/16/17 (from the past 72 hour(s))  Urinalysis, Routine w reflex microscopic     Status: Abnormal   Collection Time: 08/16/17  8:10 AM  Result Value Ref Range   Color, Urine YELLOW YELLOW   APPearance CLEAR CLEAR   Specific Gravity, Urine 1.012 1.005 - 1.030   pH 7.0 5.0 - 8.0   Glucose, UA NEGATIVE NEGATIVE mg/dL   Hgb urine dipstick MODERATE (A) NEGATIVE   Bilirubin Urine NEGATIVE NEGATIVE   Ketones, ur NEGATIVE NEGATIVE mg/dL   Protein, ur NEGATIVE NEGATIVE mg/dL   Nitrite NEGATIVE NEGATIVE   Leukocytes, UA NEGATIVE NEGATIVE   RBC / HPF 6-30 0 - 5 RBC/hpf   WBC, UA 0-5 0 - 5 WBC/hpf   Bacteria, UA NONE SEEN NONE SEEN   Squamous Epithelial / LPF 0-5 (A) NONE SEEN  Pregnancy, urine POC     Status: Abnormal   Collection Time: 08/16/17  8:29 AM  Result Value Ref Range   Preg Test, Ur POSITIVE (A) NEGATIVE    Comment:        THE SENSITIVITY OF THIS METHODOLOGY IS >24 mIU/mL   hCG, quantitative, pregnancy     Status: Abnormal   Collection Time: 08/16/17  8:44 AM  Result Value Ref Range   hCG, Beta Chain, Quant, S 1,729 (H) <5 mIU/mL    Comment:          GEST. AGE      CONC.  (mIU/mL)   <=1 WEEK        5 - 50     2 WEEKS       50 - 500     3 WEEKS       100 - 10,000     4 WEEKS     1,000 - 30,000     5 WEEKS     3,500 - 115,000   6-8 WEEKS     12,000 - 270,000    12 WEEKS     15,000 - 220,000  FEMALE AND NON-PREGNANT FEMALE:     LESS THAN 5 mIU/mL   CBC     Status: Abnormal   Collection Time: 08/16/17  8:45 AM  Result Value Ref Range   WBC 10.0 4.0 - 10.5 K/uL   RBC 3.81 (L) 3.87 -  5.11 MIL/uL   Hemoglobin 11.1 (L) 12.0 - 15.0 g/dL   HCT 34.6 (L) 36.0 - 46.0 %   MCV 90.8 78.0 - 100.0 fL   MCH 29.1 26.0 - 34.0 pg   MCHC 32.1 30.0 - 36.0 g/dL   RDW 17.7 (H) 11.5 - 15.5 %   Platelets 278 150 - 400 K/uL  Comprehensive metabolic panel     Status: Abnormal   Collection Time: 08/16/17  8:45 AM  Result Value Ref Range   Sodium 134 (L) 135 - 145 mmol/L   Potassium 4.0 3.5 - 5.1 mmol/L   Chloride 103 101 - 111 mmol/L   CO2 26 22 - 32 mmol/L   Glucose, Bld 104 (H) 65 - 99 mg/dL   BUN 5 (L) 6 - 20 mg/dL   Creatinine, Ser 0.74 0.44 - 1.00 mg/dL   Calcium 8.7 (L) 8.9 - 10.3 mg/dL   Total Protein 6.6 6.5 - 8.1 g/dL   Albumin 3.3 (L) 3.5 - 5.0 g/dL   AST 45 (H) 15 - 41 U/L   ALT 66 (H) 14 - 54 U/L   Alkaline Phosphatase 37 (L) 38 - 126 U/L   Total Bilirubin 0.5 0.3 - 1.2 mg/dL   GFR calc non Af Amer >60 >60 mL/min   GFR calc Af Amer >60 >60 mL/min    Comment: (NOTE) The eGFR has been calculated using the CKD EPI equation. This calculation has not been validated in all clinical situations. eGFR's persistently <60 mL/min signify possible Chronic Kidney Disease.    Anion gap 5 5 - 15       Assessment and Plan :Carrie Shaw is a 31 y.o. [id number] admitted for surgical management of suspected ectopic pregnancy. HCG 1729. [place] with no visualized intrauterine gestational sac and noted to have left 5 cm adnexal mass concerning for ectopic but no fetal pole seen. She has increased pelvic pain and vaginal bleeding. Reviewed course with patient and her mother and strong suspicion for ectopic pregnancy. Reviewed that ruptured ectopic is life-threatening and the safest course of action would be to visualize suspected ectopic and surgically remove it. Reviewed risks/benefits regarding surgery including infection, hemorrhage, damage to surrounding tissue and organs, possible rupture of ectopic. Reviewed possible need for removal of tube and/or ovary as needed. Patient verbalizes  understanding and gives consent, also agreeable to blood transfusion if necessary.   Plan for laparoscopic removal of ectopic pregnancy NPO Admission labs ordered    K. Arvilla Meres, M.D. Attending [place], [facility] for [employer], [place]

## 2017-08-16 NOTE — MAU Note (Signed)
Pt reports heavy bleeding, abd pain x one week. Pt reports increased swelling in her feet.

## 2017-08-16 NOTE — Transfer of Care (Signed)
Immediate Anesthesia Transfer of Care Note  Patient: Carrie Shaw  Procedure(s) Performed: EXPLORATORY LAPAROTOMY WITH REMOVAL OF LEFT ECTOPIC PREGNANCY, LEFT SALPINGECTOMY AND CYSTOTOMY OF BLADDER (N/A Abdomen) LAPAROSCOPY DIAGNOSTIC (N/A Abdomen) UNILATERAL SALPINGECTOMY (Left Abdomen) CYSTOSCOPY (N/A Bladder)  Patient Location: PACU  Anesthesia Type:General  Level of Consciousness: awake, alert , oriented and patient cooperative  Airway & Oxygen Therapy: Patient Spontanous Breathing and Patient connected to face mask oxygen  Post-op Assessment: Report given to RN and Post -op Vital signs reviewed and stable  Post vital signs: Reviewed and stable  Last Vitals:  Vitals:   08/16/17 0806 08/16/17 1100  BP: (!) 149/73 110/67  Pulse: 96 (!) 106  Resp: 18   Temp: 37.3 C   SpO2: 99%     Last Pain:  Vitals:   08/16/17 1224  TempSrc:   PainSc: 3       Patients Stated Pain Goal: 3 (08/16/17 1224)  Complications: No apparent anesthesia complications

## 2017-08-16 NOTE — MAU Provider Note (Signed)
Chief Complaint: Abdominal Pain and Vaginal Bleeding   First Provider Initiated Contact with Patient 08/16/17 0818        SUBJECTIVE HPI: Carrie Shaw is a 31 y.o. [id number] at 105w4d by LMP who presents to maternity admissions reporting heavy bleeding for one week.  Also has cramping, started at beginning, stopped and recurred today.  Had been on Chantix but stopped last week when cramping started.  Thought the Chantix was causing her symptoms and so she stopped it.  Trying to set up weight loss surgery.. Has appt with Psychiatrist at 0900 but will miss it now. She denies vaginal itching/burning, urinary symptoms, h/a, dizziness, n/v, or fever/chills.    Upon further interview, states LNMP was in August. Only spotted in September and October.  Took "morning after pill" around end of October.  Started bleeding last week.   Abdominal Pain  This is a recurrent problem. The current episode started in the past 7 days. The onset quality is gradual. The problem occurs intermittently. The problem has been unchanged. The pain is located in the LLQ, RLQ and suprapubic region. The quality of the pain is cramping. Associated symptoms include nausea. Pertinent negatives include no constipation, diarrhea, dysuria, fever, myalgias or vomiting. Nothing aggravates the pain. The pain is relieved by nothing. She has tried nothing for the symptoms.  Vaginal Bleeding  The patient's primary symptoms include vaginal bleeding. The patient's pertinent negatives include no genital itching, genital lesions or genital odor. This is a recurrent problem. The current episode started in the past 7 days. The problem occurs constantly. The problem has been unchanged. The pain is moderate. The problem affects both sides. She is pregnant. Associated symptoms include abdominal pain and nausea. Pertinent negatives include no constipation, diarrhea, dysuria, fever or vomiting. The vaginal discharge was bloody. The vaginal bleeding  is heavier than menses. She has been passing clots. She has not been passing tissue. Nothing aggravates the symptoms. She has tried nothing for the symptoms. She is sexually active.   RN Note: Pt reports heavy bleeding, abd pain x one week. Pt reports increased swelling in her feet.     Past Medical History:  Diagnosis Date  . Hypertension   . UTI (urinary tract infection)    Past Surgical History:  Procedure Laterality Date  . APPENDECTOMY     Social History   Socioeconomic History  . Marital status: Single    Spouse name: Not on file  . Number of children: Not on file  . Years of education: Not on file  . Highest education level: Not on file  Social Needs  . Financial resource strain: Not on file  . Food insecurity - worry: Not on file  . Food insecurity - inability: Not on file  . Transportation needs - medical: Not on file  . Transportation needs - non-medical: Not on file  Occupational History  . Not on file  Tobacco Use  . Smoking status: Current Every Day Smoker    Packs/day: 0.25    Years: 10.00    Pack years: 2.50    Types: Cigarettes  . Smokeless tobacco: Never Used  Substance and Sexual Activity  . Alcohol use: Yes    Comment: rare  . Drug use: No  . Sexual activity: Yes    Partners: Male    Birth control/protection: None  Other Topics Concern  . Not on file  Social History Narrative   She works at [employer]    Two children  Not married       She likes to shop and travel    No current facility-administered medications on file prior to encounter.    Current Outpatient Medications on File Prior to Encounter  Medication Sig Dispense Refill  . varenicline (CHANTIX STARTING MONTH PAK) 0.5 MG X 11 & 1 MG X 42 tablet Take one 0.5 mg tablet by mouth once daily for 3 days, then increase to one 0.5 mg tablet twice daily for 4 days, then increase to one 1 mg tablet twice daily. 53 tablet 0   No Known Allergies  I have reviewed patient's Past Medical Hx,  Surgical Hx, Family Hx, Social Hx, medications and allergies.   ROS:  Review of Systems  Constitutional: Negative for fever.  Gastrointestinal: Positive for abdominal pain and nausea. Negative for constipation, diarrhea and vomiting.  Genitourinary: Positive for vaginal bleeding. Negative for dysuria.  Musculoskeletal: Negative for myalgias.    Other systems negative   Physical Exam  Physical Exam Patient Vitals for the past 24 hrs:  BP Temp Temp src Pulse Resp SpO2 Height Weight  08/16/17 0806 (!) 149/73 99.1 F (37.3 C) Oral 96 18 99 % 5\' 11"  (1.803 m) (!) 399 lb (181 kg)   Constitutional: Well-developed, well-nourished female in no acute distress.  Cardiovascular: normal rate Respiratory: normal effort GI: Abd soft, non-tender. Pos BS x 4 MS: Extremities nontender, no edema, normal ROM Neurologic: Alert and oriented x 4.  GU: Neg CVAT.  PELVIC EXAM: Cervix pink, visually closed, without lesion, Moderated clotted blood in vault, no brisk bleeding, vaginal walls and external genitalia normal Bimanual exam: Cervix 0/long/high, firm, anterior, neg CMT, uterus nontender, nonenlarged, adnexa with generalized  Tenderness.  Nonspecific.  Exam limited by habitus   LAB RESULTS Results for orders placed or performed during the hospital encounter of 08/16/17 (from the past 24 hour(s))  Urinalysis, Routine w reflex microscopic     Status: Abnormal   Collection Time: 08/16/17  8:10 AM  Result Value Ref Range   Color, Urine YELLOW YELLOW   APPearance CLEAR CLEAR   Specific Gravity, Urine 1.012 1.005 - 1.030   pH 7.0 5.0 - 8.0   Glucose, UA NEGATIVE NEGATIVE mg/dL   Hgb urine dipstick MODERATE (A) NEGATIVE   Bilirubin Urine NEGATIVE NEGATIVE   Ketones, ur NEGATIVE NEGATIVE mg/dL   Protein, ur NEGATIVE NEGATIVE mg/dL   Nitrite NEGATIVE NEGATIVE   Leukocytes, UA NEGATIVE NEGATIVE   RBC / HPF 6-30 0 - 5 RBC/hpf   WBC, UA 0-5 0 - 5 WBC/hpf   Bacteria, UA NONE SEEN NONE SEEN    Squamous Epithelial / LPF 0-5 (A) NONE SEEN  Pregnancy, urine POC     Status: Abnormal   Collection Time: 08/16/17  8:29 AM  Result Value Ref Range   Preg Test, Ur POSITIVE (A) NEGATIVE  hCG, quantitative, pregnancy     Status: Abnormal   Collection Time: 08/16/17  8:44 AM  Result Value Ref Range   hCG, Beta Chain, Quant, S 1,729 (H) <5 mIU/mL  CBC     Status: Abnormal   Collection Time: 08/16/17  8:45 AM  Result Value Ref Range   WBC 10.0 4.0 - 10.5 K/uL   RBC 3.81 (L) 3.87 - 5.11 MIL/uL   Hemoglobin 11.1 (L) 12.0 - 15.0 g/dL   HCT 16.1 (L) 09.6 - 04.5 %   MCV 90.8 78.0 - 100.0 fL   MCH 29.1 26.0 - 34.0 pg   MCHC 32.1 30.0 - 36.0  g/dL   RDW 16.117.7 (H) 09.611.5 - 04.515.5 %   Platelets 278 150 - 400 K/uL  Comprehensive metabolic panel     Status: Abnormal   Collection Time: 08/16/17  8:45 AM  Result Value Ref Range   Sodium 134 (L) 135 - 145 mmol/L   Potassium 4.0 3.5 - 5.1 mmol/L   Chloride 103 101 - 111 mmol/L   CO2 26 22 - 32 mmol/L   Glucose, Bld 104 (H) 65 - 99 mg/dL   BUN 5 (L) 6 - 20 mg/dL   Creatinine, Ser 4.090.74 0.44 - 1.00 mg/dL   Calcium 8.7 (L) 8.9 - 10.3 mg/dL   Total Protein 6.6 6.5 - 8.1 g/dL   Albumin 3.3 (L) 3.5 - 5.0 g/dL   AST 45 (H) 15 - 41 U/L   ALT 66 (H) 14 - 54 U/L   Alkaline Phosphatase 37 (L) 38 - 126 U/L   Total Bilirubin 0.5 0.3 - 1.2 mg/dL   GFR calc non Af Amer >60 >60 mL/min   GFR calc Af Amer >60 >60 mL/min   Anion gap 5 5 - 15       IMAGING [place] Ob Comp Less 14 Wks  Result Date: 08/16/2017 CLINICAL DATA:  Pregnant patient with abdominal pain. EXAM: OBSTETRIC <14 WK US AND TRANSVAGINAL OB US TECHNIQUE: Both transabdominal and transvaginal ultrasound examinations were performed for complete evaluation of the gestation as well as the maternal uterus, adnexal regions, and pelvic cul-de-sac. Transvaginal technique was performed to assess early pregnancy. COMPARISON:  Ultrasound abdomen 09/03/2010 FINDINGS: Intrauterine gestational sac: None Yolk sac:   Not Visualized. Embryo:  Not Visualized. Cardiac Activity: Not Visualized. Maternal uterus/adnexae: No intrauterine gestation identified. The right ovary is normal in appearance measuring 3.5 x 2.6 x 2.0 cm. There is a left adnexal soft tissue mass which measures 5.5 x 3.5 x 4.7 cm. This is nonspecific in etiology however is favored to represent the left ovary and an adjacent mass between the ovary and the uterus concerning for ectopic pregnancy. The portion of the mass which may represent the suspected ectopic pregnancy is not able to be separated with manual palpation from the suspected ovary and therefore the possibility of an enlarged ovary with multiple follicles is not entirely excluded however not favored. This adnexal mass is favored to represent the left ovary and adjacent soft tissue mass with central fluid which may represent ectopic pregnancy and gestational sac surrounded by a small amount of blood products. IMPRESSION: Heterogeneous mass within the left adnexa. A portion of this mass is favored to represent the normal appearing left ovary. The more central portion is favored to represent an ectopic pregnancy, potentially within the distal aspect of the tube, immediately adjacent to the left ovary however this is not completely confirmed as these two suspected masses do not move separately with manual palpation. While findings are felt to represent an ectopic pregnancy the differential consideration of an enlarged ovary with multiple follicles/corpus luteum is not entirely excluded. Critical Value/emergent results were called by telephone at the time of interpretation on 08/16/2017 at 10:12 am to Dr. Wynelle BourgeoisMARIE Jericho Alcorn , who verbally acknowledged these results. Electronically Signed   By: Annia Beltrew  Davis M.D.   On: 08/16/2017 10:24   [place] Ob Transvaginal  Result Date: 08/16/2017 CLINICAL DATA:  Pregnant patient with abdominal pain. EXAM: OBSTETRIC <14 WK US AND TRANSVAGINAL OB US TECHNIQUE: Both  transabdominal and transvaginal ultrasound examinations were performed for complete evaluation of the gestation as well as the  maternal uterus, adnexal regions, and pelvic cul-de-sac. Transvaginal technique was performed to assess early pregnancy. COMPARISON:  Ultrasound abdomen 09/03/2010 FINDINGS: Intrauterine gestational sac: None Yolk sac:  Not Visualized. Embryo:  Not Visualized. Cardiac Activity: Not Visualized. Maternal uterus/adnexae: No intrauterine gestation identified. The right ovary is normal in appearance measuring 3.5 x 2.6 x 2.0 cm. There is a left adnexal soft tissue mass which measures 5.5 x 3.5 x 4.7 cm. This is nonspecific in etiology however is favored to represent the left ovary and an adjacent mass between the ovary and the uterus concerning for ectopic pregnancy. The portion of the mass which may represent the suspected ectopic pregnancy is not able to be separated with manual palpation from the suspected ovary and therefore the possibility of an enlarged ovary with multiple follicles is not entirely excluded however not favored. This adnexal mass is favored to represent the left ovary and adjacent soft tissue mass with central fluid which may represent ectopic pregnancy and gestational sac surrounded by a small amount of blood products. IMPRESSION: Heterogeneous mass within the left adnexa. A portion of this mass is favored to represent the normal appearing left ovary. The more central portion is favored to represent an ectopic pregnancy, potentially within the distal aspect of the tube, immediately adjacent to the left ovary however this is not completely confirmed as these two suspected masses do not move separately with manual palpation. While findings are felt to represent an ectopic pregnancy the differential consideration of an enlarged ovary with multiple follicles/corpus luteum is not entirely excluded. Critical Value/emergent results were called by telephone at the time of  interpretation on 08/16/2017 at 10:12 am to Dr. Wynelle Bourgeois , who verbally acknowledged these results. Electronically Signed   By: Annia Belt M.D.   On: 08/16/2017 10:24     MAU Management/MDM: Ordered usual first trimester r/o ectopic labs.   Pelvic exam and cultures done Will check baseline Ultrasound to rule out ectopic.  Consult Dr Earlene Plater with presentation, exam findings, and results.    This bleeding/pain can represent a normal pregnancy with bleeding, spontaneous abortion or even an ectopic which can be life-threatening.  The process as listed above helps to determine which of these is present.  Discussed [place] with Radiologist.  Dr Earlene Plater here to look at [place] and talk withpatient She has been NPO since last night.   ASSESSMENT 1. Bleeding in early pregnancy   2. Bleeding in early pregnancy     PLAN To OR for removal of ectopic pregnancy MD to follow  Wynelle Bourgeois CNM, MSN Certified Nurse-Midwife 08/16/2017  8:51 AM

## 2017-08-16 NOTE — H&P (Signed)
Chief Complaint: Abdominal Pain and Vaginal Bleeding   First Provider Initiated Contact with Patient 08/16/17 0818        SUBJECTIVE HPI: Carrie Shaw is a 31 y.o. [id number] at 105w4d by LMP who presents to maternity admissions reporting heavy bleeding for one week.  Also has cramping, started at beginning, stopped and recurred today.  Had been on Chantix but stopped last week when cramping started.  Thought the Chantix was causing her symptoms and so she stopped it.  Trying to set up weight loss surgery.. Has appt with Psychiatrist at 0900 but will miss it now. She denies vaginal itching/burning, urinary symptoms, h/a, dizziness, n/v, or fever/chills.    Upon further interview, states LNMP was in August. Only spotted in September and October.  Took "morning after pill" around end of October.  Started bleeding last week.   Abdominal Pain  This is a recurrent problem. The current episode started in the past 7 days. The onset quality is gradual. The problem occurs intermittently. The problem has been unchanged. The pain is located in the LLQ, RLQ and suprapubic region. The quality of the pain is cramping. Associated symptoms include nausea. Pertinent negatives include no constipation, diarrhea, dysuria, fever, myalgias or vomiting. Nothing aggravates the pain. The pain is relieved by nothing. She has tried nothing for the symptoms.  Vaginal Bleeding  The patient's primary symptoms include vaginal bleeding. The patient's pertinent negatives include no genital itching, genital lesions or genital odor. This is a recurrent problem. The current episode started in the past 7 days. The problem occurs constantly. The problem has been unchanged. The pain is moderate. The problem affects both sides. She is pregnant. Associated symptoms include abdominal pain and nausea. Pertinent negatives include no constipation, diarrhea, dysuria, fever or vomiting. The vaginal discharge was bloody. The vaginal bleeding  is heavier than menses. She has been passing clots. She has not been passing tissue. Nothing aggravates the symptoms. She has tried nothing for the symptoms. She is sexually active.   RN Note: Pt reports heavy bleeding, abd pain x one week. Pt reports increased swelling in her feet.     Past Medical History:  Diagnosis Date  . Hypertension   . UTI (urinary tract infection)    Past Surgical History:  Procedure Laterality Date  . APPENDECTOMY     Social History   Socioeconomic History  . Marital status: Single    Spouse name: Not on file  . Number of children: Not on file  . Years of education: Not on file  . Highest education level: Not on file  Social Needs  . Financial resource strain: Not on file  . Food insecurity - worry: Not on file  . Food insecurity - inability: Not on file  . Transportation needs - medical: Not on file  . Transportation needs - non-medical: Not on file  Occupational History  . Not on file  Tobacco Use  . Smoking status: Current Every Day Smoker    Packs/day: 0.25    Years: 10.00    Pack years: 2.50    Types: Cigarettes  . Smokeless tobacco: Never Used  Substance and Sexual Activity  . Alcohol use: Yes    Comment: rare  . Drug use: No  . Sexual activity: Yes    Partners: Male    Birth control/protection: None  Other Topics Concern  . Not on file  Social History Narrative   She works at [employer]    Two children  Not married       She likes to shop and travel    No current facility-administered medications on file prior to encounter.    Current Outpatient Medications on File Prior to Encounter  Medication Sig Dispense Refill  . varenicline (CHANTIX STARTING MONTH PAK) 0.5 MG X 11 & 1 MG X 42 tablet Take one 0.5 mg tablet by mouth once daily for 3 days, then increase to one 0.5 mg tablet twice daily for 4 days, then increase to one 1 mg tablet twice daily. 53 tablet 0   No Known Allergies  I have reviewed patient's Past Medical Hx,  Surgical Hx, Family Hx, Social Hx, medications and allergies.   ROS:  Review of Systems  Constitutional: Negative for fever.  Gastrointestinal: Positive for abdominal pain and nausea. Negative for constipation, diarrhea and vomiting.  Genitourinary: Positive for vaginal bleeding. Negative for dysuria.  Musculoskeletal: Negative for myalgias.    Other systems negative   Physical Exam  Physical Exam Patient Vitals for the past 24 hrs:  BP Temp Temp src Pulse Resp SpO2 Height Weight  08/16/17 0806 (!) 149/73 99.1 F (37.3 C) Oral 96 18 99 % 5\' 11"  (1.803 m) (!) 399 lb (181 kg)   Constitutional: Well-developed, well-nourished female in no acute distress.  Cardiovascular: normal rate Respiratory: normal effort GI: Abd soft, non-tender. Pos BS x 4 MS: Extremities nontender, no edema, normal ROM Neurologic: Alert and oriented x 4.  GU: Neg CVAT.  PELVIC EXAM: Cervix pink, visually closed, without lesion, Moderated clotted blood in vault, no brisk bleeding, vaginal walls and external genitalia normal Bimanual exam: Cervix 0/long/high, firm, anterior, neg CMT, uterus nontender, nonenlarged, adnexa with generalized  Tenderness.  Nonspecific.  Exam limited by habitus   LAB RESULTS Results for orders placed or performed during the hospital encounter of 08/16/17 (from the past 24 hour(s))  Urinalysis, Routine w reflex microscopic     Status: Abnormal   Collection Time: 08/16/17  8:10 AM  Result Value Ref Range   Color, Urine YELLOW YELLOW   APPearance CLEAR CLEAR   Specific Gravity, Urine 1.012 1.005 - 1.030   pH 7.0 5.0 - 8.0   Glucose, UA NEGATIVE NEGATIVE mg/dL   Hgb urine dipstick MODERATE (A) NEGATIVE   Bilirubin Urine NEGATIVE NEGATIVE   Ketones, ur NEGATIVE NEGATIVE mg/dL   Protein, ur NEGATIVE NEGATIVE mg/dL   Nitrite NEGATIVE NEGATIVE   Leukocytes, UA NEGATIVE NEGATIVE   RBC / HPF 6-30 0 - 5 RBC/hpf   WBC, UA 0-5 0 - 5 WBC/hpf   Bacteria, UA NONE SEEN NONE SEEN    Squamous Epithelial / LPF 0-5 (A) NONE SEEN  Pregnancy, urine POC     Status: Abnormal   Collection Time: 08/16/17  8:29 AM  Result Value Ref Range   Preg Test, Ur POSITIVE (A) NEGATIVE  hCG, quantitative, pregnancy     Status: Abnormal   Collection Time: 08/16/17  8:44 AM  Result Value Ref Range   hCG, Beta Chain, Quant, S 1,729 (H) <5 mIU/mL  CBC     Status: Abnormal   Collection Time: 08/16/17  8:45 AM  Result Value Ref Range   WBC 10.0 4.0 - 10.5 K/uL   RBC 3.81 (L) 3.87 - 5.11 MIL/uL   Hemoglobin 11.1 (L) 12.0 - 15.0 g/dL   HCT 16.1 (L) 09.6 - 04.5 %   MCV 90.8 78.0 - 100.0 fL   MCH 29.1 26.0 - 34.0 pg   MCHC 32.1 30.0 - 36.0  g/dL   RDW 16.117.7 (H) 09.611.5 - 04.515.5 %   Platelets 278 150 - 400 K/uL  Comprehensive metabolic panel     Status: Abnormal   Collection Time: 08/16/17  8:45 AM  Result Value Ref Range   Sodium 134 (L) 135 - 145 mmol/L   Potassium 4.0 3.5 - 5.1 mmol/L   Chloride 103 101 - 111 mmol/L   CO2 26 22 - 32 mmol/L   Glucose, Bld 104 (H) 65 - 99 mg/dL   BUN 5 (L) 6 - 20 mg/dL   Creatinine, Ser 4.090.74 0.44 - 1.00 mg/dL   Calcium 8.7 (L) 8.9 - 10.3 mg/dL   Total Protein 6.6 6.5 - 8.1 g/dL   Albumin 3.3 (L) 3.5 - 5.0 g/dL   AST 45 (H) 15 - 41 U/L   ALT 66 (H) 14 - 54 U/L   Alkaline Phosphatase 37 (L) 38 - 126 U/L   Total Bilirubin 0.5 0.3 - 1.2 mg/dL   GFR calc non Af Amer >60 >60 mL/min   GFR calc Af Amer >60 >60 mL/min   Anion gap 5 5 - 15       IMAGING [place] Ob Comp Less 14 Wks  Result Date: 08/16/2017 CLINICAL DATA:  Pregnant patient with abdominal pain. EXAM: OBSTETRIC <14 WK US AND TRANSVAGINAL OB US TECHNIQUE: Both transabdominal and transvaginal ultrasound examinations were performed for complete evaluation of the gestation as well as the maternal uterus, adnexal regions, and pelvic cul-de-sac. Transvaginal technique was performed to assess early pregnancy. COMPARISON:  Ultrasound abdomen 09/03/2010 FINDINGS: Intrauterine gestational sac: None Yolk sac:   Not Visualized. Embryo:  Not Visualized. Cardiac Activity: Not Visualized. Maternal uterus/adnexae: No intrauterine gestation identified. The right ovary is normal in appearance measuring 3.5 x 2.6 x 2.0 cm. There is a left adnexal soft tissue mass which measures 5.5 x 3.5 x 4.7 cm. This is nonspecific in etiology however is favored to represent the left ovary and an adjacent mass between the ovary and the uterus concerning for ectopic pregnancy. The portion of the mass which may represent the suspected ectopic pregnancy is not able to be separated with manual palpation from the suspected ovary and therefore the possibility of an enlarged ovary with multiple follicles is not entirely excluded however not favored. This adnexal mass is favored to represent the left ovary and adjacent soft tissue mass with central fluid which may represent ectopic pregnancy and gestational sac surrounded by a small amount of blood products. IMPRESSION: Heterogeneous mass within the left adnexa. A portion of this mass is favored to represent the normal appearing left ovary. The more central portion is favored to represent an ectopic pregnancy, potentially within the distal aspect of the tube, immediately adjacent to the left ovary however this is not completely confirmed as these two suspected masses do not move separately with manual palpation. While findings are felt to represent an ectopic pregnancy the differential consideration of an enlarged ovary with multiple follicles/corpus luteum is not entirely excluded. Critical Value/emergent results were called by telephone at the time of interpretation on 08/16/2017 at 10:12 am to Dr. Wynelle BourgeoisMARIE Lashundra Shiveley , who verbally acknowledged these results. Electronically Signed   By: Annia Beltrew  Davis M.D.   On: 08/16/2017 10:24   [place] Ob Transvaginal  Result Date: 08/16/2017 CLINICAL DATA:  Pregnant patient with abdominal pain. EXAM: OBSTETRIC <14 WK US AND TRANSVAGINAL OB US TECHNIQUE: Both  transabdominal and transvaginal ultrasound examinations were performed for complete evaluation of the gestation as well as the  maternal uterus, adnexal regions, and pelvic cul-de-sac. Transvaginal technique was performed to assess early pregnancy. COMPARISON:  Ultrasound abdomen 09/03/2010 FINDINGS: Intrauterine gestational sac: None Yolk sac:  Not Visualized. Embryo:  Not Visualized. Cardiac Activity: Not Visualized. Maternal uterus/adnexae: No intrauterine gestation identified. The right ovary is normal in appearance measuring 3.5 x 2.6 x 2.0 cm. There is a left adnexal soft tissue mass which measures 5.5 x 3.5 x 4.7 cm. This is nonspecific in etiology however is favored to represent the left ovary and an adjacent mass between the ovary and the uterus concerning for ectopic pregnancy. The portion of the mass which may represent the suspected ectopic pregnancy is not able to be separated with manual palpation from the suspected ovary and therefore the possibility of an enlarged ovary with multiple follicles is not entirely excluded however not favored. This adnexal mass is favored to represent the left ovary and adjacent soft tissue mass with central fluid which may represent ectopic pregnancy and gestational sac surrounded by a small amount of blood products. IMPRESSION: Heterogeneous mass within the left adnexa. A portion of this mass is favored to represent the normal appearing left ovary. The more central portion is favored to represent an ectopic pregnancy, potentially within the distal aspect of the tube, immediately adjacent to the left ovary however this is not completely confirmed as these two suspected masses do not move separately with manual palpation. While findings are felt to represent an ectopic pregnancy the differential consideration of an enlarged ovary with multiple follicles/corpus luteum is not entirely excluded. Critical Value/emergent results were called by telephone at the time of  interpretation on 08/16/2017 at 10:12 am to Dr. Wynelle BourgeoisMARIE Roston Grunewald , who verbally acknowledged these results. Electronically Signed   By: Annia Beltrew  Davis M.D.   On: 08/16/2017 10:24     MAU Management/MDM: Ordered usual first trimester r/o ectopic labs.   Pelvic exam and cultures done Will check baseline Ultrasound to rule out ectopic.  Consult Dr Earlene Plateravis with presentation, exam findings, and results.    This bleeding/pain can represent a normal pregnancy with bleeding, spontaneous abortion or even an ectopic which can be life-threatening.  The process as listed above helps to determine which of these is present.  Discussed US with Radiologist.  Dr Earlene Plateravis here to look at US and talk withpatient She has been NPO since last night.   ASSESSMENT 1. Bleeding in early pregnancy   2. Bleeding in early pregnancy     PLAN To OR for removal of ectopic pregnancy MD to follow  Wynelle BourgeoisMarie Alexiah Koroma CNM, MSN Certified Nurse-Midwife 08/16/2017  8:51 AM

## 2017-08-16 NOTE — Op Note (Signed)
08/16/2017  11:04 PM  PATIENT:  Carrie Shaw  31 y.o. female  PRE-OPERATIVE DIAGNOSIS:  ectopic  POST-OPERATIVE DIAGNOSIS:  left ectopic pregnancy  PROCEDURE:  Procedure(s): EXPLORATORY LAPAROTOMY WITH REMOVAL OF LEFT ECTOPIC PREGNANCY, LEFT SALPINGECTOMY AND CYSTOTOMY OF BLADDER (N/A) LAPAROSCOPY DIAGNOSTIC (N/A) UNILATERAL SALPINGECTOMY (Left) CYSTOSCOPY (N/A) LYSIS OF ADHESION (N/A)  SURGEON:  Surgeon(s) and Role:    * Conan Bowensavis, Kelly M, MD - Assisting    * Jerilee FieldEskridge, Matthew, MD - Assisting    * Willodean RosenthalHarraway-Smith, Tellis Spivak, MD - Primary  ANESTHESIA:   general  EBL:  300 mL   BLOOD ADMINISTERED:none  DRAINS: none   LOCAL MEDICATIONS USED:  MARCAINE     SPECIMEN:  Source of Specimen:  left fallopain tube  DISPOSITION OF SPECIMEN:  PATHOLOGY  COUNTS:  YES  TOURNIQUET:  * No tourniquets in log *  DICTATION: .Note written in EPIC  PLAN OF CARE: Admit to inpatient   PATIENT DISPOSITION:  PACU - hemodynamically stable.   Delay start of Pharmacological VTE agent (>24hrs) due to surgical blood loss or risk of bleeding: yes  Complications: incidental cystotomy  INDICATIONS: 31 y.o. [id number] at unsure dates. Had irreg bleeding followed by taking the morning after pill here with the preoperative diagnoses as listed above.  Please refer to preoperative notes for more details. Patient was counseled regarding need for laparoscopic salpingectomy. Risks of surgery including bleeding which may require transfusion or reoperation, infection, injury to bowel or other surrounding organs, need for additional procedures including laparotomy and other postoperative/anesthesia complications were explained to patient.  Written informed consent was obtained.  FINDINGS:  Hemoperitoneum estimated to be about 200 of blood and clots.  Extensive pelvic adhesions leading to limited visualization laparoscopically. Adhesion worse on the right side. Thought to be due to pts h/o ruptured  appendicitis prev. Dilated and friable left fallopian tube containing ectopic gestation. Small normal appearing uterus, the right adnexa was not visualized.   PROCEDURE IN DETAIL:  The patient was taken to the operating room where general anesthesia was administered and was found to be adequate.  She was placed in the dorsal lithotomy position, and was prepped and draped in a sterile manner.  A Foley catheter was inserted into her bladder and attached to constant drainage and a uterine manipulator was then advanced into the uterus .  After an adequate timeout was performed, attention was then turned to the patient's abdomen where a 5-mm skin incision was made on the umbilical fold. Using the Optiview trocar, a 5-mm trocar and sleeve were then advanced without difficulty into the abdomen where intraabdominal placement was confirmed by the laparoscope. Adequate pneumoperitoneum was obtained. A survey of the patient's pelvis and abdomen revealed the findings as above.  One 5-mm left lower quadrant port was placed under direct visualization.  Using the Harmonic scalpel the adhesions of omentum were carefully grasped and ligated. Unfortunately during this process, a cystotomy occurred. At this point the left adnexa and ectopic had not been visualized. Although blood was seen in the cul de sac. A decision was made to convert to a laparotomy. Good hemostasis was noted.    At this point the patient received intravenous antibiotics After an adequate timeout was performed, a Pfannensteil skin incision was made. This incision was taken down to the fascia using electrocautery with care given to maintain good hemostasis. The fascia was incised in the midline and the fascial incision was then extended bilaterally using electrocautery without difficulty. The fascia was then dissected off  the underlying rectus muscles using blunt and sharp dissection. The rectus muscles were split bluntly in the midline and the peritoneum  entered sharply without complication. This peritoneal incision was then extended superiorly and inferiorly with care given to prevent bowel or bladder injury.  Attention was then turned to the pelvis. The bowel was packed away with moist laparotomy sponges. The  Left fallopian tube was identified at this point and was noted to be friable and contained what appeared to be tissue consistent with products of conception.  The fallopian tube was clamped with a Kelly clamp and transected and suture ligated. Good hemostasis was noted. The pelvis was irrigated and cleared of all clots and debris.  Attention was then directed to the bladder.  The cystotomy was identified and repaired in 2 layers using 3-0 vicryl.  The bladder was back filled with sterile mild and found to be intact. Upon letting the water out, extravasation of fluid was noted. The location of the leak could not be identified so a cystoscopy was performed. The UOs showed bilateral efflux. There repair showed a small leak and was oversewed however there continued to be efflux of fluid so a urology consult was obtained.  Please see full intraop consult note for the urology eval.  After the bladder was repaired by urology, the  pelvis was irrigated and hemostasis was reconfirmed.  All laparotomy sponges and instruments were removed from the abdomen. The peritoneum was closed with a 0 vicryl single suture. Thee fascia was closed in a running fashion with 0 PDS suture. The subcutaneous layer was irrigated and injected with 30 ml of 0.5% Marcaine. The skin was closed with a 4-0 Vicryl subcuticular stitch. Sponge, lap, needle, and instrument counts were correct times two. The patient was taken to the recovery area awake, extubated and in stable condition.  Enrica Corliss L. Erin FullingHarraway-Smith, MD, FACOG Attending Obstetrician & Gynecologist Faculty Practice, [facility]

## 2017-08-16 NOTE — Op Note (Addendum)
Preoperative diagnosis: Bladder cystotomy Postoperative diagnosis: Same  Procedure: Exploration of bladder, repair of cystotomy, cystoscopy  Surgeon: Mena GoesEskridge Assistant: Harraway-Smith  Anesthesia: General  Indication for procedure: 31 year old undergoing exploratory laparotomy for an ectopic pregnancy.  A cystotomy was made in the right dome which Dr. Erin FullingHarraway-Smith closed but continued to leak on bladder filling and urology was consulted.  Findings: On abdominal exploration the bladder was visualized with a closed cystotomy in the right dome.  The bladder had not been mobilized nor had the descending colon.  The space of Retzius was not developed.   On cystoscopy the urethra appeared normal.  The trigone and ureteral orifices were in the normal orthotopic position.  There was clear yellow reflux from the right and the left ureter.  There was a cystotomy noted in the right dome and no other injury.   The cystotomy was opened and no other injury was noted.  Cystotomy was closed in 2 layers.  Description of procedure: The patient was in the operating room and I scrubbed in.  The bladder was inspected and a cystotomy was noted to be closed in the right dome.  The bladder had not been mobilized nor had the ascending colon. The bladder was filled and irrigation was noted to be welling up under the peritoneum and running out.  Therefore I decided to do cystoscopy.  The Foley catheter was removed and the cystoscope was passed with the 70 lens.  The bladder was inspected in its entirety.  I then placed an 4918 French catheter. I re-scrubbed and decided to open the cystotomy.  The cystotomy was opened about 4 cm and grasped with grasped with several Allises. The bladder was inspected internally from above.  No other injury was noted. I ran the mucosa closed with a 3-0 Vicryl suture.  There was an extension going down on the anterior side for about 5 mm and I threw a figure-of-eight on this part.  I then  finished running the mucosa closed.  I then ran the detrusor muscle and peritoneum closed as a second layer.  The bladder was refilled and there was a slight amount of fluid welling up again under the peritoneum and tracking somewhat anteriorly but no significant leak.  About 200 cc was put into the bladder.  It looked like it was seeping through the mucosa a slight amount. The omentum was tucked behind the bladder. I felt like with a small superior cystotomy and only weeping of fluid and not obvious leak it would heal with Foley catheter drainage. Therefore the patient was turned back over to the care with [facility] for closure.   Complications: None  Blood loss: Minimal  Specimens: None  Drains: 18 French foley catheter  Recommendations: -leave the catheter for 14 days and then obtain a cystogram.  If no leak, the catheter can be removed. -keep the patient on a nighly antibiotic (nitrofurantoin 100 mg or cephalexin 500 mg) to prevent urinary tract infection while the catheter is in place.

## 2017-08-16 NOTE — Anesthesia Preprocedure Evaluation (Signed)
Anesthesia Evaluation  Patient identified by MRN, date of birth, ID band Patient awake    Reviewed: Allergy & Precautions, NPO status , Patient's Chart, lab work & pertinent test results  Airway Mallampati: II  TM Distance: >3 FB Neck ROM: Full    Dental no notable dental hx.    Pulmonary Current Smoker,    Pulmonary exam normal breath sounds clear to auscultation       Cardiovascular negative cardio ROS Normal cardiovascular exam Rhythm:Regular Rate:Normal     Neuro/Psych negative neurological ROS  negative psych ROS   GI/Hepatic negative GI ROS, Neg liver ROS,   Endo/Other  Morbid obesity  Renal/GU negative Renal ROS  negative genitourinary   Musculoskeletal negative musculoskeletal ROS (+)   Abdominal   Peds negative pediatric ROS (+)  Hematology negative hematology ROS (+)   Anesthesia Other Findings   Reproductive/Obstetrics negative OB ROS                             Anesthesia Physical Anesthesia Plan  ASA: III  Anesthesia Plan: General   Post-op Pain Management:    Induction: Intravenous  PONV Risk Score and Plan: 2 and Dexamethasone, Ondansetron and Midazolam  Airway Management Planned: Oral ETT  Additional Equipment:   Intra-op Plan:   Post-operative Plan: Extubation in OR  Informed Consent: I have reviewed the patients History and Physical, chart, labs and discussed the procedure including the risks, benefits and alternatives for the proposed anesthesia with the patient or authorized representative who has indicated his/her understanding and acceptance.   Dental advisory given  Plan Discussed with: CRNA  Anesthesia Plan Comments:         Anesthesia Quick Evaluation

## 2017-08-16 NOTE — Brief Op Note (Signed)
08/16/2017  11:04 PM  PATIENT:  Carrie Shaw  31 y.o. female  PRE-OPERATIVE DIAGNOSIS:  ectopic  POST-OPERATIVE DIAGNOSIS:  left ectopic pregnancy  PROCEDURE:  Procedure(s): EXPLORATORY LAPAROTOMY WITH REMOVAL OF LEFT ECTOPIC PREGNANCY, LEFT SALPINGECTOMY AND CYSTOTOMY OF BLADDER (N/A) LAPAROSCOPY DIAGNOSTIC (N/A) UNILATERAL SALPINGECTOMY (Left) CYSTOSCOPY (N/A) LYSIS OF ADHESION (N/A)  SURGEON:  Surgeon(s) and Role:    * Conan Bowensavis, Kelly M, MD - Assisting    * Jerilee FieldEskridge, Matthew, MD - Assisting    * Willodean RosenthalHarraway-Smith, Anslie Spadafora, MD - Primary  ANESTHESIA:   general  EBL:  300 mL   BLOOD ADMINISTERED:none  DRAINS: none   LOCAL MEDICATIONS USED:  MARCAINE     SPECIMEN:  Source of Specimen:  left fallopain tube  DISPOSITION OF SPECIMEN:  PATHOLOGY  COUNTS:  YES  TOURNIQUET:  * No tourniquets in log *  DICTATION: .Note written in EPIC  PLAN OF CARE: Admit to inpatient   PATIENT DISPOSITION:  PACU - hemodynamically stable.   Delay start of Pharmacological VTE agent (>24hrs) due to surgical blood loss or risk of bleeding: yes  Complications: incidental cystotomy  Khylie Larmore L. Harraway-Smith, M.D., Evern CoreFACOG

## 2017-08-16 NOTE — Anesthesia Postprocedure Evaluation (Signed)
Anesthesia Post Note  Patient: Becky AugustaChauncee Spencer Philson  Procedure(s) Performed: EXPLORATORY LAPAROTOMY WITH REMOVAL OF LEFT ECTOPIC PREGNANCY, LEFT SALPINGECTOMY AND CYSTOTOMY OF BLADDER (N/A Abdomen) LAPAROSCOPY DIAGNOSTIC (N/A Abdomen) UNILATERAL SALPINGECTOMY (Left Abdomen) CYSTOSCOPY (N/A Bladder) LYSIS OF ADHESION (N/A Abdomen)     Patient location during evaluation: PACU Anesthesia Type: General Level of consciousness: awake and alert Pain management: pain level controlled Vital Signs Assessment: post-procedure vital signs reviewed and stable Respiratory status: spontaneous breathing, nonlabored ventilation, respiratory function stable and patient connected to nasal cannula oxygen Cardiovascular status: blood pressure returned to baseline and stable Postop Assessment: no apparent nausea or vomiting Anesthetic complications: no    Last Vitals:  Vitals:   08/16/17 2030 08/16/17 2045  BP: (!) 155/71 (!) 152/70  Pulse: 98 96  Resp: 12 (!) 24  Temp:    SpO2: 95% 96%    Last Pain:  Vitals:   08/16/17 1224  TempSrc:   PainSc: 3    Pain Goal: Patients Stated Pain Goal: 3 (08/16/17 1224)               Kennieth RadFitzgerald, Alexandru Moorer E

## 2017-08-17 ENCOUNTER — Other Ambulatory Visit: Payer: Self-pay | Admitting: Obstetrics and Gynecology

## 2017-08-17 ENCOUNTER — Encounter (HOSPITAL_COMMUNITY): Payer: Self-pay | Admitting: Obstetrics and Gynecology

## 2017-08-17 DIAGNOSIS — N736 Female pelvic peritoneal adhesions (postinfective): Secondary | ICD-10-CM

## 2017-08-17 DIAGNOSIS — S3720XD Unspecified injury of bladder, subsequent encounter: Secondary | ICD-10-CM

## 2017-08-17 DIAGNOSIS — N9981 Other intraoperative complications of genitourinary system: Secondary | ICD-10-CM

## 2017-08-17 LAB — HCG, QUANTITATIVE, PREGNANCY: HCG, BETA CHAIN, QUANT, S: 487 m[IU]/mL — AB (ref <5)

## 2017-08-17 MED ORDER — NITROFURANTOIN MONOHYD MACRO 100 MG PO CAPS
100.0000 mg | ORAL_CAPSULE | Freq: Every day | ORAL | Status: DC
  Administered 2017-08-17: 100 mg via ORAL
  Filled 2017-08-17 (×2): qty 1

## 2017-08-17 MED ORDER — ENOXAPARIN SODIUM 100 MG/ML ~~LOC~~ SOLN
0.5000 mg/kg | SUBCUTANEOUS | Status: DC
  Administered 2017-08-18: 90 mg via SUBCUTANEOUS
  Filled 2017-08-17 (×2): qty 1

## 2017-08-17 NOTE — Progress Notes (Signed)
Patient started on prophylactic lovenox at 40mg  subq q24h. Patient with BMI >30. Spoke with Dr. Earlene Plateravis who okay'd weight-adjusting dose to 0.5mg /kg/day.   Carrie Shaw 08/17/2017,9:26 AM

## 2017-08-17 NOTE — Progress Notes (Signed)
Gynecology Progress Note  Admission Date: 08/16/2017 Current Date: 08/17/2017 9:29 AM  Carrie Shaw is a 31 y.o. [id number] HD#2 admitted for surgical management of ectopic pregnancy   History complicated by: Patient Active Problem List   Diagnosis Date Noted  . Intraoperative bladder injury 08/17/2017  . Pelvic peritoneal adhesions, female 08/17/2017  . Ectopic pregnancy 08/16/2017  . Ectopic pregnancy without intrauterine pregnancy 08/16/2017  . Morbid obesity (HCC) 04/23/2013    ROS and patient/family/surgical history, located on admission H&P note dated 08/16/2017, have been reviewed, and there are no changes except as noted below  Subjective:  She is doing well this am, reports pain is manageable. She is not ambulating yet, denies light-headedness or dizziness. Denies nausea/vomiting and is tolerating a clear diet. Foley in place draining clear yellow urine. Is passing flatus, no BM yet.  Objective:   Vitals:   08/16/17 2135 08/16/17 2310 08/17/17 0350 08/17/17 0811  BP: (!) 157/68 (!) 165/72 (!) 125/54 (!) 127/53  Pulse: 98 95 73 81  Resp:    14  Temp:  98.7 F (37.1 C) 98.6 F (37 C) 98.4 F (36.9 C)  TempSrc:  Oral Oral Oral  SpO2: 95% 96% 97% 97%  Weight:      Height:        Total I/O In: -  Out: 325 [Urine:325]  Intake/Output Summary (Last 24 hours) at 08/17/2017 0929 Last data filed at 08/17/2017 0816 Gross per 24 hour  Intake 3125 ml  Output 2275 ml  Net 850 ml    Physical exam: BP (!) 127/53 (BP Location: Left Arm)   Pulse 81   Temp 98.4 F (36.9 C) (Oral)   Resp 14   Ht 5\' 11"  (1.803 m)   Wt (!) 399 lb (181 kg)   LMP 05/13/2017   SpO2 97%   BMI 55.65 kg/m  CONSTITUTIONAL: Well-developed, well-nourished female in no acute distress.  HENT:  Normocephalic, atraumatic, External right and left ear normal. Oropharynx is clear and moist EYES: Conjunctivae and EOM are normal. Pupils are equal, round, and reactive to light. No  scleral icterus.  NECK: Normal range of motion, supple, no masses.  Normal thyroid.  SKIN: Skin is warm and dry. No rash noted. Not diaphoretic. No erythema. No pallor. NEUROLOGIC: Alert and oriented to person, place, and time. Normal reflexes, muscle tone coordination. No cranial nerve deficit noted. PSYCHIATRIC: Normal mood and affect. Normal behavior. Normal judgment and thought content. CARDIOVASCULAR: Normal heart rate noted, regular rhythm RESPIRATORY: Clear to auscultation bilaterally. Effort and breath sounds normal, no problems with respiration noted. ABDOMEN: Soft, normal bowel sounds, no distention noted. incision clean/dry/intact with no evidence of active bleeding, appropriately tender, no rebound or guarding.  PELVIC: deferred MUSCULOSKELETAL: Normal range of motion. No tenderness.  No cyanosis, clubbing, or edema.     Labs  Recent Labs  Lab 08/16/17 0845  WBC 10.0  HGB 11.1*  HCT 34.6*  PLT 278     Assessment & Plan:   Patient is 31 y.o. [id number] POD#1 s/p diagnostic laparoscopy converted to laparotomy with removal of ectopic pregnancy and left partial salpingectomy with incidental hysterotomy repaired. She is doing well with pain well controlled and eager for dc, possible dc later today. Foley in place draining nearly adequate amounts of urine. bHCG decreasing appropriately. Will review later today for possible discharge. Answered all questions with patient and mom.   Foley to remain in place lovenox dosed ppx per pharmacy macrobid 100 mg QHS Pain meds  prn Advance diet as tolerated Activity as tolerated   Baldemar LenisK. Meryl Vennie Salsbury, M.D. Attending Obstetrician & Gynecologist, [facility], [facility]

## 2017-08-18 MED ORDER — DOCUSATE SODIUM 100 MG PO CAPS: 100.0000 mg | ORAL_CAPSULE | Freq: Two times a day (BID) | ORAL | 1 refills | Status: DC

## 2017-08-18 MED ORDER — OXYCODONE-ACETAMINOPHEN 5-325 MG PO TABS: 1.0000 | ORAL_TABLET | ORAL | 0 refills | Status: DC | PRN

## 2017-08-18 MED ORDER — PHENAZOPYRIDINE HCL 200 MG PO TABS: 200.0000 mg | ORAL_TABLET | Freq: Three times a day (TID) | ORAL | 1 refills | Status: DC | PRN

## 2017-08-18 MED ORDER — NITROFURANTOIN MONOHYD MACRO 100 MG PO CAPS: 100.0000 mg | ORAL_CAPSULE | Freq: Every day | ORAL | 0 refills | Status: DC

## 2017-08-18 MED ORDER — TOLTERODINE TARTRATE 1 MG PO TABS: 1.0000 mg | ORAL_TABLET | Freq: Two times a day (BID) | ORAL | 1 refills | Status: DC | PRN

## 2017-08-18 NOTE — Progress Notes (Signed)
Discharge instructions given to pt and pt's s/o and mother. Educated on catheter care. Foley is now draining adequate amounts of clear, pale, yellow urine. Emptied 625 cc. Pt has no questions or concerns at this time. IV taken out and tolerated well. Pt is being wheeled down in stable condition.

## 2017-08-18 NOTE — Progress Notes (Signed)
Emptied 200 cc of amber urine out of catheter bag. According to the pt's chart, the catheter was last emptied at 3:00 am. Dr. Vergie LivingPickens aware of urinary output. No new orders at this time. Encouraged pt to drink lots of fluids. Will continue to monitor.

## 2017-08-18 NOTE — Discharge Instructions (Signed)
The Pyridium is for bladder pain, take this as needed but no more often than prescribed.  The Detrol is for bladder spasm, take this as needed but not more often than prescribed. Please take the Colace daily and if needed, you may take it twice per day.  Take the Macrobid every night.  You may take Ibuprofen (Motrin) scheduled for pain, do not take more than 600 mg every 6 hours. Use the Percocet for additional pain relief as needed.   Cystogram A cystogram, which is also called cystography, is a type of X-ray exam that is used to check for problems with the bladder. You may need this exam if you have blood in your urine (hematuria), urinary tract infections that keep coming back, or possible damage to your bladder from an injury. During the exam, a type of dye will be injected into your bladder. This dye shows up on an X-ray. The dye makes it easier for your health care provider to see your bladder and check for any possible problems. A cystogram can be used to help diagnose problems such as:  Cysts.  Blood clots (hematoma).  Punctures, tears, or other damage to the bladder.  Bladder tumors.  Backward flow of urine from the bladder to the ureter (vesicoureteral reflux).  An abnormal tunnel (fistula) from the bladder to another organ.  Tell a health care provider about:  Any allergies you have.  All medicines you are taking, including vitamins, herbs, eye drops, creams, and over-the-counter medicines.  Previous problems you or members of your family have had with the use of anesthetics or injectable dyes.  Any blood disorders you have.  Any surgeries you have had.  Any medical conditions you have.  If you are pregnant or you think that you may be pregnant. What are the risks? Generally, this is a safe procedure. However, problems can occur, including:  Exposure to a small amount of radiation.  Urinary tract infection.  Allergic reaction to the dye. This is rare.  What  happens before the procedure?  Ask your health care provider about: ? Changing or stopping your regular medicines. This is especially important if you are taking diabetes medicines or blood thinners. ? Taking medicines such as aspirin and ibuprofen. These medicines can thin your blood. Do not take these medicines before your procedure if your health care provider instructs you not to.  Follow your health care providers instructions about eating or drinking restrictions. What happens during the procedure?  You will lie on your back on an X-ray table.  A thin, flexible tube (catheter) will be passed through your urethra and inserted into your bladder. The urethra is the tube through which urine exits your bladder.  Dye will be injected into your bladder through the catheter. When your bladder is full, the catheter will be clamped.  X-ray images of your bladder area will be taken. You may be asked to move into different positions for some of the scans.  The catheter will then be removed.  In some cases, you may be asked to urinate while more X-rays are taken of your bladder and urethra (voiding cystogram). The procedure may vary among health care providers and hospitals. What happens after the procedure?  Return to your normal activities and your normal diet as directed by your health care provider.  Drink enough fluid to keep your urine clear or pale yellow. This will help to flush the dye out of your body.  It is your responsibility to obtain  your test results. Ask your health care provider or the department performing the test when and how you will get your results. This information is not intended to replace advice given to you by your health care provider. Make sure you discuss any questions you have with your health care provider. Document Released: 10/20/2004 Document Revised: 04/07/2016 Document Reviewed: 06/30/2014 Elsevier Interactive Patient Education  2018 Elsevier  Inc.   Indwelling Urinary Catheter Care, Adult Take good care of your catheter to keep it working and to prevent problems. How to wear your catheter Attach your catheter to your leg with tape (adhesive tape) or a leg strap. Make sure it is not too tight. If you use tape, remove any bits of tape that are already on the catheter. How to wear a drainage bag You should have:  A large overnight bag.  A small leg bag.  Overnight Bag You may wear the overnight bag at any time. Always keep the bag below the level of your bladder but off the floor. When you sleep, put a clean plastic bag in a wastebasket. Then hang the bag inside the wastebasket. Leg Bag Never wear the leg bag at night. Always wear the leg bag below your knee. Keep the leg bag secure with a leg strap or tape. How to care for your skin  Clean the skin around the catheter at least once every day.  Shower every day. Do not take baths.  Put creams, lotions, or ointments on your genital area only as told by your doctor.  Do not use powders, sprays, or lotions on your genital area. How to clean your catheter and your skin 1. Wash your hands with soap and water. 2. Wet a washcloth in warm water and gentle (mild) soap. 3. Use the washcloth to clean the skin where the catheter enters your body. Clean downward and wipe away from the catheter in small circles. Do not wipe toward the catheter. 4. Pat the area dry with a clean towel. Make sure to clean off all soap. How to care for your drainage bags Empty your drainage bag when it is ?- full or at least 2-3 times a day. Replace your drainage bag once a month or sooner if it starts to smell bad or look dirty. Do not clean your drainage bag unless told by your doctor. Emptying a drainage bag  Supplies Needed  Rubbing alcohol.  Gauze pad or cotton ball.  Tape or a leg strap.  Steps 1. Wash your hands with soap and water. 2. Separate (detach) the bag from your leg. 3. Hold the  bag over the toilet or a clean container. Keep the bag below your hips and bladder. This stops pee (urine) from going back into the tube. 4. Open the pour spout at the bottom of the bag. 5. Empty the pee into the toilet or container. Do not let the pour spout touch any surface. 6. Put rubbing alcohol on a gauze pad or cotton ball. 7. Use the gauze pad or cotton ball to clean the pour spout. 8. Close the pour spout. 9. Attach the bag to your leg with tape or a leg strap. 10. Wash your hands.  Changing a drainage bag Supplies Needed  Alcohol wipes.  A clean drainage bag.  Adhesive tape or a leg strap.  Steps 1. Wash your hands with soap and water. 2. Separate the dirty bag from your leg. 3. Pinch the rubber catheter with your fingers so that pee does not  spill out. 4. Separate the catheter tube from the drainage tube where these tubes connect (at the connection valve). Do not let the tubes touch any surface. 5. Clean the end of the catheter tube with an alcohol wipe. Use a different alcohol wipe to clean the end of the drainage tube. 6. Connect the catheter tube to the drainage tube of the clean bag. 7. Attach the new bag to the leg with adhesive tape or a leg strap. 8. Wash your hands.  How to prevent infection and other problems  Never pull on your catheter or try to remove it. Pulling can damage tissue in your body.  Always wash your hands before and after touching your catheter.  If a leg strap gets wet, replace it with a dry one.  Drink enough fluids to keep your pee clear or pale yellow, or as told by your doctor.  Do not let the drainage bag or tubing touch the floor.  Wear cotton underwear.  If you are female, wipe from front to back after you poop (have a bowel movement).  Check on the catheter often to make sure it works and the tubing is not twisted. Get help if:  Your pee is cloudy.  Your pee smells unusually bad.  Your pee is not draining into the  bag.  Your tube gets clogged.  Your catheter starts to leak.  Your bladder feels full. Get help right away if:  You have redness, swelling, or pain where the catheter enters your body.  You have fluid, pus, or a bad smell coming from the area where the catheter enters your body.  The area where the catheter enters your body feels warm.  You have a fever.  You have pain in your: ? Stomach (abdomen). ? Legs. ? Lower back. ? Bladder.  You see blood fill the catheter.  Your pee is pink or red.  You feel sick to your stomach (nauseous).  You throw up (vomit).  You have chills.  Your catheter gets pulled out. This information is not intended to replace advice given to you by your health care provider. Make sure you discuss any questions you have with your health care provider. Document Released: 01/13/2013 Document Revised: 08/16/2016 Document Reviewed: 03/03/2014 Elsevier Interactive Patient Education  2018 Elsevier Inc.    Pelvic Rest  How do I rest my pelvis? For as long as told by your health care provider:  Do not have sex, sexual stimulation, or an orgasm.  Do not use tampons. Do not douche. Do not put anything in your vagina.  Do not lift anything that is heavier than 10 lb (4.5 kg).  Avoid activities that take a lot of effort (are strenuous).  Avoid any activity in which your pelvic muscles could become strained.  When should I seek medical care? Seek medical care if you have:  Cramping pain in your lower abdomen.  Vaginal discharge.  A low, dull backache.  Regular contractions.  Uterine tightening.  When should I seek immediate medical care? Seek immediate medical care if:  You have heavy vaginal bleeding.  This information is not intended to replace advice given to you by your health care provider. Make sure you discuss any questions you have with your health care provider. Document Released: 01/13/2011 Document Revised: 02/24/2016  Document Reviewed: 03/22/2015 Elsevier Interactive Patient Education  [employer]2018 Elsevier Inc.

## 2017-08-18 NOTE — Discharge Summary (Signed)
Physician Discharge Summary  Patient ID: Carrie Shaw MRN: [id number] DOB/AGE: 30/11/1985 30 y.o.  Admit date: 08/16/2017 Discharge date: 08/18/2017  Admission Diagnoses: ruptured ectopic pregnancy  Discharge Diagnoses:  Principal Problem:   Ectopic pregnancy Active Problems:   Ectopic pregnancy without intrauterine pregnancy   Intraoperative bladder injury   Pelvic peritoneal adhesions, female   Discharged Condition: fair  Hospital Course: Please see HPI dated 08/16/2017 for full details. Briefly, this is a 31 y.o. [id number] female admitted for suspected ectopic pregnancy taken for surgical management. Laparoscopy complicated by incidental cystotomy, converted to laparotomy with left partial salpingectomy and removal of ectopic pregnancy, repair of cystotomy. Her hospital course was uncomplicated. By HD#2, she was ambulating without dizziness, tolerating regular diet without nausea/vomiting and passing flatus without issue. She reports well controlled pain with PO pain meds. She was discharged home HD#2 in good condition. Due to cystotomy, she will go home with foley catheter in place. [facility] CT [facility] She will f/u in office. Strict instructions to call with any issues.  Medical history significant for history of post partum pre-eclampsia.  Physical exam  Vitals:   08/17/17 1604 08/17/17 2107 08/18/17 0030 08/18/17 0339  BP: (!) 151/64 (!) 104/50 130/60 138/72  Pulse: 84 84 82 69  Resp: 16 16 18 18   Temp: 98.5 F (36.9 C) 98.1 F (36.7 C) 98.6 F (37 C) 97.8 F (36.6 C)  TempSrc: Oral Oral Oral Oral  SpO2: 94% 99% 97% 96%  Weight:      Height:       BP (!) 121/52 (BP Location: Left Arm)   Pulse 95   Temp 98.4 F (36.9 C) (Oral)   Resp 18   Ht 5\' 11"  (1.803 m)   Wt (!) 399 lb (181 kg)   LMP 05/13/2017   SpO2 96%   BMI 55.65 kg/m  CONSTITUTIONAL: Well-developed, well-nourished female in no acute distress. Morbidly  obese. HENT:  Normocephalic, atraumatic, External right and left ear normal. Oropharynx is clear and moist EYES: Conjunctivae and EOM are normal. Pupils are equal, round, and reactive to light. No scleral icterus.  NECK: Normal range of motion, supple, no masses.  Normal thyroid.  SKIN: Skin is warm and dry. No rash noted. Not diaphoretic. No erythema. No pallor. NEUROLOGIC: Alert and oriented to person, place, and time. Normal reflexes, muscle tone coordination. No cranial nerve deficit noted. PSYCHIATRIC: Normal mood and affect. Normal behavior. Normal judgment and thought content. CARDIOVASCULAR: Normal heart rate noted, regular rhythm RESPIRATORY: Clear to auscultation bilaterally. Effort and breath sounds normal, no problems with respiration noted. ABDOMEN: Soft, normal bowel sounds, no distention noted.  No tenderness, rebound or guarding. Incisions (pfannenstiel and umbilical) covered by dressing, moderate amount dark blood on laparotomy dressing, incision appears intact with no active bleeding MUSCULOSKELETAL: Normal range of motion. No tenderness.  No cyanosis, clubbing, or edema.    Labs: Lab Results  Component Value Date   WBC 10.0 08/16/2017   HGB 11.1 (L) 08/16/2017   HCT 34.6 (L) 08/16/2017   MCV 90.8 08/16/2017   PLT 278 08/16/2017   CMP Latest Ref Rng & Units 08/16/2017  Glucose 65 - 99 mg/dL -  BUN 6 - 20 mg/dL -  Creatinine 3.080.44 - 6.571.00 mg/dL 8.460.64  Sodium 962135 - 952145 mmol/L -  Potassium 3.5 - 5.1 mmol/L -  Chloride 101 - 111 mmol/L -  CO2 22 - 32 mmol/L -  Calcium 8.9 - 10.3 mg/dL -  Total Protein 6.5 - 8.1 g/dL -  Total Bilirubin 0.3 - 1.2 mg/dL -  Alkaline Phos 38 - 161126 U/L -  AST 15 - 41 U/L -  ALT 14 - 54 U/L -    Disposition: 01-Home or Self Care  Discharge Instructions    Diet - low sodium heart healthy   Complete by:  As directed    Increase activity slowly   Complete by:  As directed      An After Visit Summary was printed and given to the  patient. Allergies as of 08/18/2017   No Known Allergies     Medication List    STOP taking these medications   hydrochlorothiazide 25 MG tablet Commonly known as:  HYDRODIURIL   varenicline 0.5 MG X 11 & 1 MG X 42 tablet Commonly known as:  CHANTIX STARTING MONTH PAK     TAKE these medications   docusate sodium 100 MG capsule Commonly known as:  COLACE Take 1 capsule (100 mg total) 2 (two) times daily by mouth.   nitrofurantoin (macrocrystal-monohydrate) 100 MG capsule Commonly known as:  MACROBID Take 1 capsule (100 mg total) at bedtime by mouth.   oxyCODONE-acetaminophen 5-325 MG tablet Commonly known as:  PERCOCET/ROXICET Take 1 tablet every 4 (four) hours as needed by mouth for moderate pain or severe pain (moderate to severe pain (when tolerating fluids)).   phenazopyridine 200 MG tablet Commonly known as:  PYRIDIUM Take 1 tablet (200 mg total) 3 (three) times daily as needed by mouth for pain (urethral spasm).   tolterodine 1 MG tablet Commonly known as:  DETROL Take 1 tablet (1 mg total) 2 (two) times daily as needed by mouth. Take at least 12 hours apart as needed.      Follow-up Information    [facility] Go on 08/27/2017.   Why:  Go at 10:00 am to the radiology suite. Contact information: [address] [place] [phone]       [facility] Schedule an appointment as soon as possible for a visit on 08/28/2017.   Specialty:  Obstetrics and Gynecology Why:  Please call to make appointment with Dr. Erin FullingHarraway-Smith or Dr. Earlene Plateravis for the week of 08/28/17. Please call this office with any questions or issues. Contact information: [address] [place] [id number] [phone]          Signed: Conan BowensKelly M Davis 08/18/2017, 9:43 AM

## 2017-08-20 LAB — BPAM RBC
BLOOD PRODUCT EXPIRATION DATE: 201812062359
BLOOD PRODUCT EXPIRATION DATE: 201812062359
Blood Product Expiration Date: 201812122359
Blood Product Expiration Date: 201812122359
ISSUE DATE / TIME: 201811152034
ISSUE DATE / TIME: 201811160354
UNIT TYPE AND RH: 5100
UNIT TYPE AND RH: 5100
Unit Type and Rh: 5100
Unit Type and Rh: 5100

## 2017-08-20 LAB — TYPE AND SCREEN
ABO/RH(D): O POS
Antibody Screen: NEGATIVE
Unit division: 0
Unit division: 0
Unit division: 0
Unit division: 0

## 2017-08-22 ENCOUNTER — Telehealth: Payer: Self-pay | Admitting: General Practice

## 2017-08-22 NOTE — Telephone Encounter (Signed)
Received call from Marylu LundJanet at [facility] stating they had faxed forms on 11/16 and want to confirm if we have received them and they would also like to know when the patient had surgery. She can be reached at [phone] ext [phone]. Per chart review, forms haven't been received and patient has completed ROI.  Called patient and discussed with her that if she needs forms completed, she will need to come by the office and sign a ROI and bring $15 for completion of paperwork. Patient verbalized understanding and states she will come by shortly. Patient had no questions

## 2017-08-27 ENCOUNTER — Encounter (HOSPITAL_COMMUNITY): Payer: Self-pay | Admitting: Radiology

## 2017-08-27 ENCOUNTER — Ambulatory Visit (HOSPITAL_COMMUNITY)
Admit: 2017-08-27 | Discharge: 2017-08-27 | Disposition: A | Discharge status: Home / Self Care | Payer: 59 | Attending: Obstetrics and Gynecology | Admitting: Obstetrics and Gynecology

## 2017-08-27 DIAGNOSIS — X58XXXA Exposure to other specified factors, initial encounter: Secondary | ICD-10-CM | POA: Insufficient documentation

## 2017-08-27 DIAGNOSIS — S3720XD Unspecified injury of bladder, subsequent encounter: Secondary | ICD-10-CM

## 2017-08-27 DIAGNOSIS — S3720XA Unspecified injury of bladder, initial encounter: Secondary | ICD-10-CM | POA: Diagnosis not present

## 2017-08-27 MED ORDER — IOTHALAMATE MEGLUMINE 17.2 % UR SOLN
250.0000 mL | Freq: Once | URETHRAL | Status: AC | PRN
  Administered 2017-08-27: 150 mL via INTRAVESICAL

## 2017-08-29 ENCOUNTER — Encounter: Payer: Self-pay | Admitting: General Practice

## 2017-08-29 ENCOUNTER — Encounter: Payer: Self-pay | Admitting: Obstetrics & Gynecology

## 2017-08-29 ENCOUNTER — Ambulatory Visit (INDEPENDENT_AMBULATORY_CARE_PROVIDER_SITE_OTHER): Payer: 59 | Admitting: Obstetrics & Gynecology

## 2017-08-29 VITALS — BP 165/102 | HR 76 | Wt 391.9 lb

## 2017-08-29 DIAGNOSIS — Z9889 Other specified postprocedural states: Secondary | ICD-10-CM

## 2017-08-29 DIAGNOSIS — Z029 Encounter for administrative examinations, unspecified: Secondary | ICD-10-CM

## 2017-08-29 DIAGNOSIS — O165 Unspecified maternal hypertension, complicating the puerperium: Secondary | ICD-10-CM | POA: Insufficient documentation

## 2017-08-29 DIAGNOSIS — I10 Essential (primary) hypertension: Secondary | ICD-10-CM

## 2017-08-29 MED ORDER — AMLODIPINE BESYLATE 5 MG PO TABS: 5.0000 mg | ORAL_TABLET | Freq: Every day | ORAL | 3 refills | Status: DC

## 2017-08-29 MED ORDER — DICLOXACILLIN SODIUM 500 MG PO CAPS: 500.0000 mg | ORAL_CAPSULE | Freq: Four times a day (QID) | ORAL | 0 refills | Status: DC

## 2017-08-29 MED ORDER — IBUPROFEN 600 MG PO TABS: 600.0000 mg | ORAL_TABLET | Freq: Four times a day (QID) | ORAL | 3 refills | Status: DC | PRN

## 2017-08-29 NOTE — Progress Notes (Signed)
History:  31 y.o. [id number] here today for 2 week post op check.  She is s/p a laparotomy for ectopic pregnancy with incidental cyclotomy. She was discharged with a Foley cath which she wore for 10 days and had removed 2 days s prev. She had a cystogram 2 days prev that showed no leakage. The Foley cath was removed.   She reports good pain control. She c/o a foul odor and drainage from her incision.   The following portions of the patient's history were reviewed and updated as appropriate: allergies, current medications, past family history, past medical history, past social history, past surgical history and problem list.  Review of Systems:  Pertinent items are noted in HPI.   Objective:  Physical Exam Blood pressure (!) 165/102, pulse 76, weight (!) 391 lb 14.4 oz (177.8 kg), last menstrual period 05/13/2017, unknown if currently breastfeeding.  CONSTITUTIONAL: Well-developed, well-nourished female in no acute distress.  HENT:  Normocephalic, atraumatic EYES: Conjunctivae and EOM are normal. No scleral icterus.  NECK: Normal range of motion SKIN: Skin is warm and dry. No rash noted. Not diaphoretic.No pallor. NEUROLGIC: Alert and oriented to person, place, and time. Normal coordination.  Abd: Soft, nontender and nondistended; obese.  Incision: there is a dressing on that is soaked. There is a foul smell and the incision is edematous and the edges are open.  There is no fluctuance and there is no obvious drainage.  The incision was cleaned with antibacterial soap and dried.       Labs and Imaging [place] Ob Comp Less 14 Wks  Result Date: 08/16/2017 CLINICAL DATA:  Pregnant patient with abdominal pain. EXAM: OBSTETRIC <14 WK US AND TRANSVAGINAL OB US TECHNIQUE: Both transabdominal and transvaginal ultrasound examinations were performed for complete evaluation of the gestation as well as the maternal uterus, adnexal regions, and pelvic cul-de-sac. Transvaginal technique was performed to assess  early pregnancy. COMPARISON:  Ultrasound abdomen 09/03/2010 FINDINGS: Intrauterine gestational sac: None Yolk sac:  Not Visualized. Embryo:  Not Visualized. Cardiac Activity: Not Visualized. Maternal uterus/adnexae: No intrauterine gestation identified. The right ovary is normal in appearance measuring 3.5 x 2.6 x 2.0 cm. There is a left adnexal soft tissue mass which measures 5.5 x 3.5 x 4.7 cm. This is nonspecific in etiology however is favored to represent the left ovary and an adjacent mass between the ovary and the uterus concerning for ectopic pregnancy. The portion of the mass which may represent the suspected ectopic pregnancy is not able to be separated with manual palpation from the suspected ovary and therefore the possibility of an enlarged ovary with multiple follicles is not entirely excluded however not favored. This adnexal mass is favored to represent the left ovary and adjacent soft tissue mass with central fluid which may represent ectopic pregnancy and gestational sac surrounded by a small amount of blood products. IMPRESSION: Heterogeneous mass within the left adnexa. A portion of this mass is favored to represent the normal appearing left ovary. The more central portion is favored to represent an ectopic pregnancy, potentially within the distal aspect of the tube, immediately adjacent to the left ovary however this is not completely confirmed as these two suspected masses do not move separately with manual palpation. While findings are felt to represent an ectopic pregnancy the differential consideration of an enlarged ovary with multiple follicles/corpus luteum is not entirely excluded. Critical Value/emergent results were called by telephone at the time of interpretation on 08/16/2017 at 10:12 am to Dr. Wynelle BourgeoisMARIE WILLIAMS , who  verbally acknowledged these results. Electronically Signed   By: Annia Beltrew  Davis M.D.   On: 08/16/2017 10:24   [place] Ob Transvaginal  Result Date: 08/16/2017 CLINICAL DATA:   Pregnant patient with abdominal pain. EXAM: OBSTETRIC <14 WK US AND TRANSVAGINAL OB US TECHNIQUE: Both transabdominal and transvaginal ultrasound examinations were performed for complete evaluation of the gestation as well as the maternal uterus, adnexal regions, and pelvic cul-de-sac. Transvaginal technique was performed to assess early pregnancy. COMPARISON:  Ultrasound abdomen 09/03/2010 FINDINGS: Intrauterine gestational sac: None Yolk sac:  Not Visualized. Embryo:  Not Visualized. Cardiac Activity: Not Visualized. Maternal uterus/adnexae: No intrauterine gestation identified. The right ovary is normal in appearance measuring 3.5 x 2.6 x 2.0 cm. There is a left adnexal soft tissue mass which measures 5.5 x 3.5 x 4.7 cm. This is nonspecific in etiology however is favored to represent the left ovary and an adjacent mass between the ovary and the uterus concerning for ectopic pregnancy. The portion of the mass which may represent the suspected ectopic pregnancy is not able to be separated with manual palpation from the suspected ovary and therefore the possibility of an enlarged ovary with multiple follicles is not entirely excluded however not favored. This adnexal mass is favored to represent the left ovary and adjacent soft tissue mass with central fluid which may represent ectopic pregnancy and gestational sac surrounded by a small amount of blood products. IMPRESSION: Heterogeneous mass within the left adnexa. A portion of this mass is favored to represent the normal appearing left ovary. The more central portion is favored to represent an ectopic pregnancy, potentially within the distal aspect of the tube, immediately adjacent to the left ovary however this is not completely confirmed as these two suspected masses do not move separately with manual palpation. While findings are felt to represent an ectopic pregnancy the differential consideration of an enlarged ovary with multiple follicles/corpus luteum is  not entirely excluded. Critical Value/emergent results were called by telephone at the time of interpretation on 08/16/2017 at 10:12 am to Dr. Wynelle BourgeoisMARIE WILLIAMS , who verbally acknowledged these results. Electronically Signed   By: Annia Beltrew  Davis M.D.   On: 08/16/2017 10:24   Dg Cystogram  Result Date: 08/27/2017 CLINICAL DATA:  Laparotomy for ectopic pregnancy 08/16/2017. Injury right bladder dome. Foley catheter in place. Rule out leak. EXAM: CYSTOGRAM TECHNIQUE: After catheterization of the urinary bladder following sterile technique the bladder was filled with 150 mL Cysto-Hypaque 30% by drip infusion. Serial spot images were obtained during bladder filling and post draining. FLUOROSCOPY TIME:  Fluoroscopy Time:  0 minutes 54 second Radiation Exposure Index (if provided by the fluoroscopic device): Number of Acquired Spot Images: 0 COMPARISON:  None. FINDINGS: Preliminary KUB negative Bladder fills with contrast. No leak identified. There is smooth flattening of the dome of the bladder on the right compared to the left compatible with edema from recent bladder surgery. Post drainage images reveals no evidence of contrast leak. IMPRESSION: No bladder leak identified.  Foley catheter left in place. Electronically Signed   By: Marlan Palauharles  Clark M.D.   On: 08/27/2017 09:53    Assessment & Plan:  superficial wound infxn Essential HTN  Dicloxacillin 500mg  qid x 7 day Reviewed wound care- keep incision clean and dry Norvasc 5 mg q day  Carrie Shaw, M.D., Evern CoreFACOG

## 2017-08-30 ENCOUNTER — Encounter: Payer: Self-pay | Admitting: Obstetrics & Gynecology

## 2017-09-03 ENCOUNTER — Telehealth: Payer: Self-pay | Admitting: *Deleted

## 2017-09-03 NOTE — Telephone Encounter (Signed)
Received message left on nurse voicemail today at [phone].  Patient states she needs to speak to someone about her FMLA papers.  States her job says they haven't received them and we say we haven't received them.  I looked through everything that had been faxed and didn't see them.  I contacted patient to make her aware.  Patient states she will email to me.  My email address was given to patient.  Will complete paperwork as soon as received.

## 2017-09-05 ENCOUNTER — Ambulatory Visit (HOSPITAL_COMMUNITY)
Admission: RE | Admit: 2017-09-05 | Discharge: 2017-09-05 | Disposition: A | Discharge status: Home / Self Care | Payer: 59 | Source: Ambulatory Visit | Attending: General Surgery | Admitting: General Surgery

## 2017-09-05 ENCOUNTER — Other Ambulatory Visit: Payer: Self-pay

## 2017-09-05 ENCOUNTER — Encounter: Payer: Self-pay | Admitting: *Deleted

## 2017-09-05 DIAGNOSIS — Z01818 Encounter for other preprocedural examination: Secondary | ICD-10-CM | POA: Insufficient documentation

## 2017-09-06 ENCOUNTER — Encounter: Payer: 59 | Admitting: Adult Health

## 2017-09-06 ENCOUNTER — Ambulatory Visit: Payer: 59 | Admitting: *Deleted

## 2017-09-06 DIAGNOSIS — T8149XA Infection following a procedure, other surgical site, initial encounter: Secondary | ICD-10-CM

## 2017-09-06 DIAGNOSIS — Z9889 Other specified postprocedural states: Secondary | ICD-10-CM

## 2017-09-06 NOTE — Progress Notes (Signed)
Here to today for wound check- saw Dr.Harraway Carrie Shaw for 2 week post op and had a wound infection about a week ago. Has finished 7 day course antibiotics. She states wound doesn't have smell anymore. Wound pink, no edema or odor noted, Edges open. Carrie Shaw , CNM and Dr. Debroah Shaw in to see patient. Instructed her to come back 1-2 weeks for postop. Also to keep wound clean, dry and to call us or go to [facility] if she has fever, discharge, odor, etc. She voices understanding.

## 2017-09-06 NOTE — Progress Notes (Signed)
Chart reviewed for nurse visit. Agree with plan of care.   Marylene LandKooistra, Kathryn Lorraine, CNM 09/06/2017 5:13 PM

## 2017-09-10 ENCOUNTER — Institutional Professional Consult (permissible substitution): Payer: Managed Care, Other (non HMO) | Admitting: Neurology

## 2017-09-11 ENCOUNTER — Ambulatory Visit (INDEPENDENT_AMBULATORY_CARE_PROVIDER_SITE_OTHER): Payer: 59 | Admitting: Obstetrics & Gynecology

## 2017-09-11 ENCOUNTER — Ambulatory Visit: Payer: 59 | Admitting: Skilled Nursing Facility1

## 2017-09-11 ENCOUNTER — Encounter: Payer: Self-pay | Admitting: Obstetrics & Gynecology

## 2017-09-11 ENCOUNTER — Encounter: Payer: 59 | Attending: General Surgery | Admitting: Skilled Nursing Facility1

## 2017-09-11 ENCOUNTER — Encounter: Payer: Self-pay | Admitting: Skilled Nursing Facility1

## 2017-09-11 VITALS — BP 121/83 | HR 87 | Ht 71.0 in | Wt 388.5 lb

## 2017-09-11 DIAGNOSIS — Z713 Dietary counseling and surveillance: Secondary | ICD-10-CM | POA: Insufficient documentation

## 2017-09-11 DIAGNOSIS — T8149XA Infection following a procedure, other surgical site, initial encounter: Secondary | ICD-10-CM

## 2017-09-11 DIAGNOSIS — Z9889 Other specified postprocedural states: Secondary | ICD-10-CM

## 2017-09-11 NOTE — Progress Notes (Signed)
  Assessment:   1st SWL Appointment.   [employer] so maybe 3 SWL.  Pt arrives having gained about 6 pounds. Pt states she had an ectopic pregnancy and since have had her Fallopian tube removed. Pt states she is switching to [employer] insurance. Pt states she has not decided on which surgery she is getting. Pt states she is avoiding meat because it sits heavy. Pt states she identified when she was using food for stress.   Sleeve or RYGB Start weight at NDES: 379.2 Wt: 385.7 BMI: 53.79  MEDICATIONS: See List   DIETARY INTAKE:  24-hr recall:  B ( AM): 3 scrambled eggs and sausage and toast Snk ( AM):  L ( PM): vegetable soup---sour cream cheese black beans quinoa lettuce in spinach wrap Snk ( PM):  D ( PM): chili beans and corn bread  Snk ( PM): cereal Beverages: water, coffee with creamer  Usual physical activity: ADL's  Diet to Follow: 1600 calories 180 g carbohydrates 120 g protein 44 g fat   Nutritional Diagnosis:  [place]-3.3 Overweight/obesity related to past poor dietary habits and physical inactivity as evidenced by patient w/ planned sleeve or RYGB surgery following dietary guidelines for continued weight loss.    Intervention:  Nutrition counseling for upcoming Bariatric Surgery. Goals: -Encouraged to engage in 150 minutes of moderate physical activity including cardiovascular and weight baring weekly -Keep trying to eat 3 meals a day -Try the soy crumbles  -Try not to drink 15 minutes before you eat, drink nothing while you eat and wait 30 minutes after you eat before you drink again   Teaching Method Utilized:  Visual Auditory Hands on  Barriers to learning/adherence to lifestyle change: none identified   Demonstrated degree of understanding via:  Teach Back   Monitoring/Evaluation:  Dietary intake, exercise, and body weight prn.

## 2017-09-11 NOTE — Progress Notes (Signed)
History:  31 y.o. [id number] here today for wound check. She reports that the odor and the drainage is resolved. She reports that she is worried about the way that it looks. She deneis pain, fever or chills.     The following portions of the patient's history were reviewed and updated as appropriate: allergies, current medications, past family history, past medical history, past social history, past surgical history and problem list.  Review of Systems:  Pertinent items are noted in HPI.   Objective:  Physical Exam Blood pressure 121/83, pulse 87, height 5\' 11"  (1.803 m), weight (!) 388 lb 8 oz (176.2 kg), last menstrual period 05/13/2017, not currently breastfeeding. Gen: NAD Abd: Soft, nontender and nondistended. Incision: clean and dry. There is a 4x1 cm area in the center of the incision that is still healing. There is no erythema and no drainage. The tissue is nice and pink.   Pelvic: not indicated  Assessment & Plan:  Post op wound infxn- wound clinically improved. Healing by secondary intention.   Keep wound clean and dry.  F/u in 1 week or sooner prn May RTW in 1 week. FMLA papers completed by supervisor  Eber Jonesarolyn L. Harraway-Smith, M.D., Evern CoreFACOG

## 2017-09-11 NOTE — Patient Instructions (Addendum)
-  Keep trying to eat 3 meals a day  -Try the soy crumbles   -Try not to drink 15 minutes before you eat, drink nothing while you eat and wait 30 minutes after you eat before you drink again

## 2017-09-23 ENCOUNTER — Other Ambulatory Visit: Payer: Self-pay

## 2017-09-23 ENCOUNTER — Telehealth (HOSPITAL_COMMUNITY): Payer: Self-pay | Admitting: Emergency Medicine

## 2017-09-23 ENCOUNTER — Ambulatory Visit (HOSPITAL_COMMUNITY)
Admission: EM | Admit: 2017-09-23 | Discharge: 2017-09-23 | Disposition: A | Discharge status: Home / Self Care | Payer: 59 | Attending: Family Medicine | Admitting: Family Medicine

## 2017-09-23 ENCOUNTER — Encounter: Payer: Self-pay | Admitting: Emergency Medicine

## 2017-09-23 DIAGNOSIS — M545 Low back pain, unspecified: Secondary | ICD-10-CM

## 2017-09-23 MED ORDER — MELOXICAM 7.5 MG PO TABS: 7.5000 mg | ORAL_TABLET | Freq: Every day | ORAL | 0 refills | Status: DC

## 2017-09-23 MED ORDER — KETOROLAC TROMETHAMINE 60 MG/2ML IM SOLN
60.0000 mg | Freq: Once | INTRAMUSCULAR | Status: AC
  Administered 2017-09-23: 60 mg via INTRAMUSCULAR

## 2017-09-23 MED ORDER — KETOROLAC TROMETHAMINE 60 MG/2ML IM SOLN
INTRAMUSCULAR | Status: AC
  Filled 2017-09-23: qty 2

## 2017-09-23 MED ORDER — CYCLOBENZAPRINE HCL 10 MG PO TABS: 10.0000 mg | ORAL_TABLET | Freq: Two times a day (BID) | ORAL | 0 refills | Status: DC | PRN

## 2017-09-23 NOTE — Telephone Encounter (Signed)
Pt reports her pharmacy was closed and wanted for us to call in the meds given today to [facility] ([address])  Rx re-sent to new pharmacy of choice.

## 2017-09-23 NOTE — ED Triage Notes (Signed)
Per pt she is having lower back pain,

## 2017-09-23 NOTE — ED Provider Notes (Signed)
[facility]    CSN: [id number] Arrival date & time: 09/23/17  1819     History   Chief Complaint Chief Complaint  Patient presents with  . Back Pain    HPI Carrie Shaw is a 31 y.o. female.   31 year old female with history of hypertension comes in for 1 week history of low back pain.  Patient states back pain started after waking up the morning.  She wonders if it is due to changes in mattresses.  States pain is constant, shooting with tightness, exacerbated by movements.  She states  pain is relieved with hot showers.  She takes ibuprofen 1200-1800 mg once a day with improvement, last dose yesterday.  Denies urinary symptoms such as frequency, dysuria, hematuria.  Denies injury/trauma.  Denies numbness and tingling, loss of bladder or bowel control.  She recently had  an ectopic pregnancy with salpingectomy.       Past Medical History:  Diagnosis Date  . HSV infection   . Hypertension   . SVD (spontaneous vaginal delivery)    x 2  . Termination of pregnancy (fetus)    x 2  . UTI (urinary tract infection)     Patient Active Problem List   Diagnosis Date Noted  . Essential hypertension 08/29/2017  . Intraoperative bladder injury 08/17/2017  . Pelvic peritoneal adhesions, female 08/17/2017  . Ectopic pregnancy without intrauterine pregnancy 08/16/2017  . Morbid obesity (HCC) 04/23/2013    Past Surgical History:  Procedure Laterality Date  . APPENDECTOMY    . CYSTOSCOPY N/A 08/16/2017   Procedure: CYSTOSCOPY;  Surgeon: Conan Bowens, MD;  Location: [facility];  Service: Gynecology;  Laterality: N/A;  . LAPAROSCOPY N/A 08/16/2017   Procedure: LAPAROSCOPY DIAGNOSTIC;  Surgeon: Conan Bowens, MD;  Location: [facility];  Service: Gynecology;  Laterality: N/A;  . LAPAROTOMY N/A 08/16/2017   Procedure: EXPLORATORY LAPAROTOMY WITH REMOVAL OF LEFT ECTOPIC PREGNANCY, LEFT SALPINGECTOMY AND CYSTOTOMY OF BLADDER;  Surgeon: Conan Bowens, MD;  Location: [facility];  Service: Gynecology;  Laterality: N/A;  . LYSIS OF ADHESION N/A 08/16/2017   Procedure: LYSIS OF ADHESION;  Surgeon: Conan Bowens, MD;  Location: [facility];  Service: Gynecology;  Laterality: N/A;  . UNILATERAL SALPINGECTOMY Left 08/16/2017   Procedure: UNILATERAL SALPINGECTOMY;  Surgeon: Conan Bowens, MD;  Location: [facility];  Service: Gynecology;  Laterality: Left;    OB History    Gravida Para Term Preterm AB Living   5 2 2   2 2    SAB TAB Ectopic Multiple Live Births   0 2     2       Home Medications    Prior to Admission medications   Medication Sig Start Date End Date Taking? Authorizing Provider  amLODipine (NORVASC) 5 MG tablet Take 1 tablet (5 mg total) by mouth daily. 08/29/17  Yes Willodean Rosenthal, MD  cyclobenzaprine (FLEXERIL) 10 MG tablet Take 1 tablet (10 mg total) by mouth 2 (two) times daily as needed for muscle spasms. 09/23/17   Cathie Hoops, Amy V, PA-C  meloxicam (MOBIC) 7.5 MG tablet Take 1 tablet (7.5 mg total) by mouth daily. 09/23/17   Belinda Fisher, PA-C    Family History Family History  Problem Relation Age of Onset  . Depression Mother   . Hypertension Mother   . Hypertension Father   . Schizophrenia Sister   . Bipolar disorder Sister     Social History Social History   Tobacco Use  .  Smoking status: Current Every Day Smoker    Packs/day: 0.25    Years: 10.00    Pack years: 2.50    Types: Cigarettes  . Smokeless tobacco: Never Used  Substance Use Topics  . Alcohol use: Yes    Comment: rare  . Drug use: No     Allergies   Patient has no known allergies.   Review of Systems Review of Systems  Reason unable to perform ROS: See HPI as above.     Physical Exam Triage Vital Signs ED Triage Vitals  Enc Vitals Group     BP 09/23/17 1834 (!) 154/83     Pulse Rate 09/23/17 1834 76     Resp --      Temp 09/23/17 1834 98.1 F (36.7 C)     Temp Source 09/23/17 1834 Oral     SpO2 09/23/17 1834 100 %     Weight --      Height --        Head Circumference --      Peak Flow --      Pain Score 09/23/17 1833 10     Pain Loc --      Pain Edu? --      Excl. in GC? --    No data found.  Updated Vital Signs BP (!) 154/83 (BP Location: Left Arm)   Pulse 76   Temp 98.1 F (36.7 C) (Oral)   LMP 09/23/2017 (Exact Date)   SpO2 100%   Visual Acuity Right Eye Distance:   Left Eye Distance:   Bilateral Distance:    Right Eye Near:   Left Eye Near:    Bilateral Near:     Physical Exam  Constitutional: She is oriented to person, place, and time. She appears well-developed and well-nourished. No distress.  HENT:  Head: Normocephalic and atraumatic.  Eyes: Conjunctivae are normal. Pupils are equal, round, and reactive to light.  Cardiovascular: Normal rate, regular rhythm and normal heart sounds. Exam reveals no gallop and no friction rub.  No murmur heard. Pulmonary/Chest: Effort normal and breath sounds normal. She has no wheezes. She has no rales.  Musculoskeletal:  No tenderness on palpation of the spinous processes.  Tenderness to palpation of bilateral lumbar region.  Patient unable to perform range of motion due to pain.  No tenderness on palpation of hips.  Range of motion and strength testing of hips was deferred due to back pain.  Negative straight leg raise.  Sensation intact and equal bilaterally.  Neurological: She is alert and oriented to person, place, and time.  Skin: Skin is warm and dry.     UC Treatments / Results  Labs (all labs ordered are listed, but only abnormal results are displayed) Labs Reviewed - No data to display  EKG  EKG Interpretation None       Radiology No results found.  Procedures Procedures (including critical care time)  Medications Ordered in UC Medications  ketorolac (TORADOL) injection 60 mg (not administered)     Initial Impression / Assessment and Plan / UC Course  I have reviewed the triage vital signs and the nursing notes.  Pertinent labs & imaging  results that were available during my care of the patient were reviewed by me and considered in my medical decision making (see chart for details).    Toradol injection in office today.  Start NSAID as directed for pain and inflammation. Muscle relaxant as needed. Ice/heat compresses. Discussed with patient strain can take  up to 3-4 weeks to resolve, but should be getting better each week. Return precautions given.  Patient expresses understanding and agrees to plan.   Final Clinical Impressions(s) / UC Diagnoses   Final diagnoses:  Acute bilateral low back pain without sciatica    ED Discharge Orders        Ordered    meloxicam (MOBIC) 7.5 MG tablet  Daily     09/23/17 1909    cyclobenzaprine (FLEXERIL) 10 MG tablet  2 times daily PRN     09/23/17 1909       Belinda FisherYu, Amy V, PA-C 09/23/17 1915

## 2017-09-23 NOTE — Discharge Instructions (Signed)
Toradol in office today. Start Mobic as directed. Flexeril as needed at night. Flexeril can make you drowsy, so do not take if you are going to drive, operate heavy machinery, or make important decisions. Ice/heat compresses as needed. This can take up to 3-4 weeks to completely resolve, but you should be feeling better each week. Follow up here or with PCP if symptoms worsen, changes for reevaluation. If experience numbness/tingling of the inner thighs, loss of bladder or bowel control, go to the emergency department for evaluation.

## 2017-10-11 ENCOUNTER — Encounter: Payer: 59 | Attending: General Surgery | Admitting: Skilled Nursing Facility1

## 2017-10-11 ENCOUNTER — Encounter: Payer: Self-pay | Admitting: Skilled Nursing Facility1

## 2017-10-11 DIAGNOSIS — Z713 Dietary counseling and surveillance: Secondary | ICD-10-CM | POA: Insufficient documentation

## 2017-10-11 NOTE — Progress Notes (Signed)
  Assessment:   2nd SWL Appointment.   Pt needs one more SWL. Pt states she had an ectopic pregnancy and since have had her Fallopian tube removed. Pt states she is switching to [employer] insurance.  Pt arrives having gained 3 pounds. Pt states she identifies when she has craving  And has one small piece of chocolate instead of over doing it. Pt states she has been working on not drinking with meals and it is going well. Pt states her mother had the lap band. Pt states she tried spaghetti squash and liked it.    Sleeve or RYGB Start weight at NDES: 379.2 Wt: 388 BMI: 54.14  MEDICATIONS: See List   DIETARY INTAKE:  24-hr recall:  B ( AM): 3 scrambled eggs and sausage and toast Snk ( AM):  L ( PM): vegetable soup---sour cream cheese black beans quinoa lettuce in spinach wrap Snk ( PM):  D ( PM): chili beans and corn bread  Snk ( PM): cereal Beverages: water flavoring, coffee with creamer  Usual physical activity: cardio at the gym 3 days a weeks   Diet to Follow: 1600 calories 180 g carbohydrates 120 g protein 44 g fat   Nutritional Diagnosis:  [place]-3.3 Overweight/obesity related to past poor dietary habits and physical inactivity as evidenced by patient w/ planned sleeve or RYGB surgery following dietary guidelines for continued weight loss.    Intervention:  Nutrition counseling for upcoming Bariatric Surgery. Goals: -Encouraged to engage in 150 minutes of moderate physical activity including cardiovascular and weight baring weekly -Keep aiming to eat 3 meals a day -Try the soy crumbles  -Keep aiming not to drink 15 minutes before you eat, drink nothing while you eat and wait 30 minutes after you eat before you drink again  -Be sure to get in 64 fluid ounces  -Chew until applesauce consistency  -Practice mindful eating Teaching Method Utilized:  Visual Auditory Hands on  Barriers to learning/adherence to lifestyle change: none identified   Demonstrated degree of  understanding via:  Teach Back   Monitoring/Evaluation:  Dietary intake, exercise, and body weight prn.

## 2017-10-16 ENCOUNTER — Ambulatory Visit: Payer: Managed Care, Other (non HMO) | Admitting: Neurology

## 2017-10-16 ENCOUNTER — Encounter: Payer: Self-pay | Admitting: Neurology

## 2017-10-16 VITALS — BP 172/93 | HR 81 | Ht 71.0 in | Wt 389.0 lb

## 2017-10-16 DIAGNOSIS — G478 Other sleep disorders: Secondary | ICD-10-CM

## 2017-10-16 DIAGNOSIS — Z820 Family history of epilepsy and other diseases of the nervous system: Secondary | ICD-10-CM

## 2017-10-16 DIAGNOSIS — R0683 Snoring: Secondary | ICD-10-CM | POA: Diagnosis not present

## 2017-10-16 DIAGNOSIS — R519 Headache, unspecified: Secondary | ICD-10-CM

## 2017-10-16 DIAGNOSIS — Z6841 Body Mass Index (BMI) 40.0 and over, adult: Secondary | ICD-10-CM | POA: Diagnosis not present

## 2017-10-16 DIAGNOSIS — R51 Headache: Secondary | ICD-10-CM

## 2017-10-16 DIAGNOSIS — G4719 Other hypersomnia: Secondary | ICD-10-CM

## 2017-10-16 NOTE — Patient Instructions (Addendum)

## 2017-10-16 NOTE — Progress Notes (Signed)
Subjective:    Patient ID: Carrie Shaw is a 32 y.o. female.  HPI     Huston Foley, MD, PhD [facility] [address], [address] [address] [place], [place] [address]  Dear Dr. Andrey Campanile,   I saw your patient, Carrie Shaw, upon your kind request in my neurologic clinic today for initial consultation of her sleep disorder, in particular, concern for underlying obstructive sleep apnea. The patient is unaccompanied today. As you know, Carrie Shaw is a 32 year old right-handed woman with an underlying medical history of hypertension, chronic knee pain, smoking, and morbid obesity with a BMI of over 50, who reports snoring and excessive daytime somnolence. He does not typically wake up rested. She has had morning headaches, described typically as dull and achy and frontal, at times she has had nocturnal headaches. She denies night to night nocturia or telltale symptoms of restless leg syndrome. She denies any parasomnias or leg movements at night.  I reviewed your office from 08/02/2017, which you kindly included. She is being evaluated for bariatric surgery.  Her Epworth sleepiness score is 6 out of 24, fatigue score is 49 out of 63. She started to quit smoking in October 2018, currently smokes about 2 packs per week. She drinks alcohol infrequently, about once a month, she does not drink caffeine on a day-to-day basis. She lives with her 2 children, ages 49 and 68, she works for [employer]. Mother has OSA, had sleep apnea surgery and bariatric surgery is no longer uses a CPAP machine. Bedtime is between 10 and 11 PM, wakeup time around 6.    Her Past Medical History Is Significant For: Past Medical History:  Diagnosis Date  . HSV infection   . Hypertension   . SVD (spontaneous vaginal delivery)    x 2  . Termination of pregnancy (fetus)    x 2  . UTI (urinary tract infection)     Her Past Surgical History Is Significant For: Past Surgical History:   Procedure Laterality Date  . APPENDECTOMY    . CYSTOSCOPY N/A 08/16/2017   Procedure: CYSTOSCOPY;  Surgeon: Conan Bowens, MD;  Location: [facility];  Service: Gynecology;  Laterality: N/A;  . LAPAROSCOPY N/A 08/16/2017   Procedure: LAPAROSCOPY DIAGNOSTIC;  Surgeon: Conan Bowens, MD;  Location: [facility];  Service: Gynecology;  Laterality: N/A;  . LAPAROTOMY N/A 08/16/2017   Procedure: EXPLORATORY LAPAROTOMY WITH REMOVAL OF LEFT ECTOPIC PREGNANCY, LEFT SALPINGECTOMY AND CYSTOTOMY OF BLADDER;  Surgeon: Conan Bowens, MD;  Location: [facility];  Service: Gynecology;  Laterality: N/A;  . LYSIS OF ADHESION N/A 08/16/2017   Procedure: LYSIS OF ADHESION;  Surgeon: Conan Bowens, MD;  Location: [facility];  Service: Gynecology;  Laterality: N/A;  . UNILATERAL SALPINGECTOMY Left 08/16/2017   Procedure: UNILATERAL SALPINGECTOMY;  Surgeon: Conan Bowens, MD;  Location: [facility];  Service: Gynecology;  Laterality: Left;    Her Family History Is Significant For: Family History  Problem Relation Age of Onset  . Depression Mother   . Hypertension Mother   . Hypertension Father   . Schizophrenia Sister   . Bipolar disorder Sister     Her Social History Is Significant For: Social History   Socioeconomic History  . Marital status: Single    Spouse name: None  . Number of children: None  . Years of education: None  . Highest education level: None  Social Needs  . Financial resource strain: None  . Food insecurity - worry: None  .  Food insecurity - inability: None  . Transportation needs - medical: None  . Transportation needs - non-medical: None  Occupational History  . None  Tobacco Use  . Smoking status: Current Every Day Smoker    Packs/day: 0.25    Years: 10.00    Pack years: 2.50    Types: Cigarettes  . Smokeless tobacco: Never Used  Substance and Sexual Activity  . Alcohol use: Yes    Comment: rare  . Drug use: No  . Sexual activity: Yes    Partners: Male    Birth control/protection:  None    Comment: pregnant  Other Topics Concern  . None  Social History Narrative   She works at [employer]    Two children   Not married       She likes to shop and travel     Her Allergies Are:  No Known Allergies:   Her Current Medications Are:  Outpatient Encounter Medications as of 10/16/2017  Medication Sig  . amLODipine (NORVASC) 5 MG tablet Take 1 tablet (5 mg total) by mouth daily.  . cyclobenzaprine (FLEXERIL) 10 MG tablet Take 1 tablet (10 mg total) by mouth 2 (two) times daily as needed for muscle spasms.  . meloxicam (MOBIC) 7.5 MG tablet Take 1 tablet (7.5 mg total) by mouth daily.   No facility-administered encounter medications on file as of 10/16/2017.   :  Review of Systems:  Out of a complete 14 point review of systems, all are reviewed and negative with the exception of these symptoms as listed below: Review of Systems  Neurological:       Pt presents today to discuss her sleep. Pt has never had a sleep study but does not know if she snores.  Epworth Sleepiness Scale 0= would never doze 1= slight chance of dozing 2= moderate chance of dozing 3= high chance of dozing  Sitting and reading: 0 Watching TV: 1 Sitting inactive in a public place (ex. Theater or meeting): 0 As a passenger in a car for an hour without a break: 1 Lying down to rest in the afternoon: 3 Sitting and talking to someone: 0 Sitting quietly after lunch (no alcohol): 1 In a car, while stopped in traffic: 0 Total: 6     Objective:  Neurological Exam  Physical Exam Physical Examination:   Vitals:   10/16/17 1104  BP: (!) 172/93  Pulse: 81    General Examination: The patient is a very pleasant 32 y.o. female in no acute distress. She appears well-developed and well-nourished and well groomed.   HEENT: Normocephalic, atraumatic, pupils are equal, round and reactive to light and accommodation. Extraocular tracking is good without limitation to gaze excursion or nystagmus noted.  Normal smooth pursuit is noted. Hearing is grossly intact. Face is symmetric with normal facial animation and normal facial sensation. Speech is clear with no dysarthria noted. There is no hypophonia. There is no lip, neck/head, jaw or voice tremor. Neck is supple with full range of passive and active motion. There are no carotid bruits on auscultation. Oropharynx exam reveals: mild mouth dryness, good dental hygiene and moderate airway crowding, due to smaller airway entry, wider uvula and tonsils in place, 1-2+ in size, right side slightly bigger than left. Mallampati is class II. Neck circumference is 17 and 7/8 inches. Tongue protrudes centrally and palate elevates symmetrically.  Chest: Clear to auscultation without wheezing, rhonchi or crackles noted.  Heart: S1+S2+0, regular and normal without murmurs, rubs or gallops noted.  Abdomen: Soft, non-tender and non-distended with normal bowel sounds appreciated on auscultation.  Extremities: There is no pitting edema in the distal lower extremities bilaterally. Pedal pulses are intact.  Skin: Warm and dry without trophic changes noted.  Musculoskeletal: exam reveals no obvious joint deformities, tenderness or joint swelling or erythema.   Neurologically:  Mental status: The patient is awake, alert and oriented in all 4 spheres. Her immediate and remote memory, attention, language skills and fund of knowledge are appropriate. There is no evidence of aphasia, agnosia, apraxia or anomia. Speech is clear with normal prosody and enunciation. Thought process is linear. Mood is normal and affect is normal.  Cranial nerves II - XII are as described above under HEENT exam. In addition: shoulder shrug is normal with equal shoulder height noted. Motor exam: Normal bulk, strength and tone is noted. There is no drift, tremor or rebound. Romberg is negative. Reflexes are 21 throughout. Fine motor skills and coordination: intact with normal finger taps, normal  hand movements, normal rapid alternating patting, normal foot taps and normal foot agility.  Cerebellar testing: No dysmetria or intention tremor on finger to nose testing. Heel to shin is unremarkable bilaterally. There is no truncal or gait ataxia.  Sensory exam: intact to light touch in the upper and lower extremities.  Gait, station and balance: She stands easily. No veering to one side is noted. No leaning to one side is noted. Posture is age-appropriate and stance is narrow based. Gait shows normal stride length and normal pace. No problems turning are noted. Tandem walk is unremarkable.   Assessment and Plan:  In summary, Carrie Shaw is a very pleasant 32 y.o.-year old female with an underlying medical history of hypertension, chronic knee pain, smoking, and morbid obesity with a BMI of over 50, whose history, and physical exam are concerning for obstructive sleep apnea (OSA). I had a long chat with the patient about my findings and the diagnosis of OSA, its prognosis and treatment options. We talked about medical treatments, surgical interventions and non-pharmacological approaches. I explained in particular the risks and ramifications of untreated moderate to severe OSA, especially with respect to developing cardiovascular disease down the Road, including congestive heart failure, difficult to treat hypertension, cardiac arrhythmias, or stroke. Even type 2 diabetes has, in part, been linked to untreated OSA. Symptoms of untreated OSA include daytime sleepiness, memory problems, mood irritability and mood disorder such as depression and anxiety, lack of energy, as well as recurrent headaches, especially morning headaches. We talked about the importance of consistent smoking cessation and trying to maintain a healthy lifestyle in general, as well as the importance of weight control. I encouraged the patient to eat healthy, exercise daily and keep well hydrated, to keep a scheduled bedtime  and wake time routine, to not skip any meals and eat healthy snacks in between meals. I advised the patient not to drive when feeling sleepy. I recommended the following at this time: sleep study with potential positive airway pressure titration. (We will score hypopneas at 4%).   I explained the sleep test procedure to the patient and also outlined possible surgical and non-surgical treatment options of OSA, including the use of a custom-made dental device (which would require a referral to a specialist dentist or oral surgeon), upper airway surgical options, such as pillar implants, radiofrequency surgery, tongue base surgery, and UPPP (which would involve a referral to an ENT surgeon). Rarely, jaw surgery such as mandibular advancement may be considered.  I  also explained the CPAP treatment option to the patient, who indicated that she would be willing to try CPAP if the need arises. I explained the importance of being compliant with PAP treatment, not only for insurance purposes but primarily to improve Her symptoms, and for the patient's long term health benefit, including to reduce Her cardiovascular risks. I answered all her questions today and the patient was in agreement. I would like to see her back after the sleep study is completed and encouraged her to call with any interim questions, concerns, problems or updates.   Thank you very much for allowing me to participate in the care of this nice patient. If I can be of any further assistance to you please do not hesitate to call me at [phone].  Sincerely,   Huston Foley, MD, PhD

## 2017-10-24 ENCOUNTER — Encounter: Payer: Self-pay | Admitting: Obstetrics & Gynecology

## 2017-10-24 ENCOUNTER — Telehealth: Payer: Self-pay | Admitting: Neurology

## 2017-10-24 DIAGNOSIS — G478 Other sleep disorders: Secondary | ICD-10-CM

## 2017-10-24 NOTE — Addendum Note (Signed)
Addended by: Geronimo RunningINKINS, Korin Hartwell A on: 10/24/2017 08:20 AM   Modules accepted: Orders

## 2017-10-24 NOTE — Telephone Encounter (Signed)
Insurance denied in lab. Please put in an order for a hst.

## 2017-10-24 NOTE — Telephone Encounter (Signed)
HST order placed. 

## 2017-10-31 ENCOUNTER — Encounter: Payer: Self-pay | Admitting: Obstetrics & Gynecology

## 2017-10-31 ENCOUNTER — Ambulatory Visit: Payer: Managed Care, Other (non HMO) | Admitting: Neurology

## 2017-10-31 ENCOUNTER — Ambulatory Visit (INDEPENDENT_AMBULATORY_CARE_PROVIDER_SITE_OTHER): Payer: Self-pay | Admitting: Obstetrics & Gynecology

## 2017-10-31 DIAGNOSIS — G4733 Obstructive sleep apnea (adult) (pediatric): Secondary | ICD-10-CM | POA: Diagnosis not present

## 2017-10-31 DIAGNOSIS — I10 Essential (primary) hypertension: Secondary | ICD-10-CM

## 2017-10-31 DIAGNOSIS — G478 Other sleep disorders: Secondary | ICD-10-CM

## 2017-10-31 DIAGNOSIS — Z3009 Encounter for other general counseling and advice on contraception: Secondary | ICD-10-CM

## 2017-10-31 LAB — POCT PREGNANCY, URINE: Preg Test, Ur: NEGATIVE

## 2017-10-31 MED ORDER — NORGESTIMATE-ETH ESTRADIOL 0.25-35 MG-MCG PO TABS: 1.0000 | ORAL_TABLET | Freq: Every day | ORAL | 11 refills | Status: DC

## 2017-10-31 MED ORDER — HYDROCHLOROTHIAZIDE 25 MG PO TABS: 25.0000 mg | ORAL_TABLET | Freq: Every day | ORAL | 3 refills | Status: DC

## 2017-10-31 NOTE — Progress Notes (Signed)
History:  32 y.o. [id number] here today for a post op check. LMP 09/16/2017. Had light bleeding in early Jan. Pts is s/p EXPLORATORY LAPAROTOMY WITH REMOVAL OF LEFT ECTOPIC PREGNANCY, LEFT SALPINGECTOMY AND CYSTOTOMY OF BLADDER, LAPAROSCOPY DIAGNOSTIC and  LYSIS OF ADHESIONS on 08/16/2017.  She reports that she has not been seen for her last incision check. She reports that her incision is completely closed at present.   She was started on BP meds in ov for persistently elevated BP. Pt reports that she takes her meds daily.Pt has been seen and evaluated for gastric bypass and requests a letter of recommendation. She does not have a primary care provider.     Pt uses condoms occassionally.  Pt wants to start OCPs. Was prev on OCPs with no reported issues.   The following portions of the patient's history were reviewed and updated as appropriate: allergies, current medications, past family history, past medical history, past social history, past surgical history and problem list.  Review of Systems:  Pertinent items are noted in HPI.   Objective:  Physical Exam Blood pressure (!) 151/103, pulse 74, weight (!) 389 lb (176.4 kg), last menstrual period 09/15/2017. CONSTITUTIONAL: Well-developed, well-nourished female in no acute distress.  HENT:  Normocephalic, atraumatic EYES: Conjunctivae and EOM are normal. No scleral icterus.  NECK: Normal range of motion SKIN: Skin is warm and dry. No rash noted. Not diaphoretic.No pallor. NEUROLGIC: Alert and oriented to person, place, and time. Normal coordination.  Abd: Soft, nontender and nondistended; obese. Incision clean, dry and intact  Pelvic: not done  UPT- neg   Assessment & Plan:  Chronic HTN-  BPs still not controlled with Norvasc 5mg   HCTZ 25 mg po q day  Obesity  HTN and joint pain rec gastric bypass or other appropriate obesity surgery.  Contraception counseling- reviewed options with pt  No contraindications to OCPs  Sprintec 1 po  q day  Begin OCPS after next cycle   F/u in 4 weeks or sooner prn  Total face-to-face time with patient was 18 min.  Greater than 50% was spent in counseling and coordination of care with the patient.

## 2017-11-09 ENCOUNTER — Other Ambulatory Visit: Payer: Self-pay | Admitting: Neurology

## 2017-11-09 DIAGNOSIS — G4733 Obstructive sleep apnea (adult) (pediatric): Secondary | ICD-10-CM

## 2017-11-09 NOTE — Progress Notes (Signed)
Patient referred by Dr. Andrey CampanileWilson in surgery, seen by me on 10/16/16, diagnostic HST on 10/31/17.    Please call and notify the patient that the recent home sleep test showed obstructive sleep apnea. OSA is overall mild, but worth treating to see if she feels better after treatment and especially in light of bariatric surgery planned. To that end I recommend treatment for this in the form of autoPAP, which means, that we don't have to bring her in for a sleep study with CPAP, but will let her try an autoPAP machine at home, through a DME company (of her choice, or as per insurance requirement). The DME representative will educate her on how to use the machine, how to put the mask on, etc. I have placed an order in the chart. Please send referral, talk to patient, send report to referring MD. We will need a FU in sleep clinic for 10 weeks post-PAP set up, please arrange that with me or one of our NPs. Thanks,   Huston FoleySaima Kiera Hussey, MD, PhD [facility])

## 2017-11-09 NOTE — Procedures (Signed)
  [facility] [address] [place], [place] [id number] NAME:  Carrie Shaw                                                        DOB: 1986-05-29 MEDICAL RECORD NUMBER  [id number]                                    DOS: 10/31/17 REFERRING PHYSICIAN: Gaynelle AduEric Wilson, MD STUDY PERFORMED: Home Sleep Test HISTORY: 32 year old woman with a history of hypertension, chronic knee pain, smoking, and morbid obesity, who reports snoring and excessive daytime somnolence. Her Epworth sleepiness score is 6 out of 24, BMI of 54.2.   STUDY RESULTS: Total Recording Time:   8 hours 33 minutes (total valid test time: 6 hours, 47 min) Total Apnea/Hypopnea Index (AHI):  5.8/h Average Oxygen Saturation: 95% Lowest Oxygen Desaturation:   85%  Total Time Oxygen Saturation Below or at 88%: 38 mins (8%)  Average Heart Rate:      76 bpm  IMPRESSION: OSA  RECOMMENDATION: This home sleep test demonstrates overall mild or borderline obstructive sleep apnea with a total AHI of 5.8/hour and O2 nadir of 85%. Given the patient's medical history and sleep related complaints, treatment with positive airway pressure is recommended, especially in light of planned weight loss surgery. The treatment can be achieved in the form of autoPAP trial/titration at home. A full night CPAP titration study will help with proper treatment settings and mask fitting if needed down the road. Please note that untreated obstructive sleep apnea carries additional perioperative morbidity. Patients with significant obstructive sleep apnea should receive perioperative PAP therapy and the surgeons and particularly the anesthesiologist should be informed of the diagnosis and the severity of the sleep disordered breathing. Other causes of the patient's symptoms, including circadian rhythm disturbances, an underlying mood disorder, medication effect and/or an underlying medical problem cannot be ruled out based on this test.  Clinical correlation is recommended. The patient and his referring provider will be notified of the test results. The [facility]  I certify that I have reviewed the raw data recording prior to the issuance of this report in accordance with the standards of the American Academy of Sleep Medicine (AASM).  Huston FoleySaima Zyan Coby, MD, PhD [facility]) Diplomat, ABPN (Neurology and Sleep)

## 2017-11-12 ENCOUNTER — Encounter: Payer: Managed Care, Other (non HMO) | Attending: General Surgery | Admitting: Skilled Nursing Facility1

## 2017-11-12 ENCOUNTER — Telehealth: Payer: Self-pay

## 2017-11-12 ENCOUNTER — Encounter: Payer: Self-pay | Admitting: Skilled Nursing Facility1

## 2017-11-12 DIAGNOSIS — Z713 Dietary counseling and surveillance: Secondary | ICD-10-CM | POA: Diagnosis not present

## 2017-11-12 NOTE — Patient Instructions (Signed)
[email or web address]   

## 2017-11-12 NOTE — Telephone Encounter (Signed)
-----   Message from Huston FoleySaima Athar, MD sent at 11/09/2017 12:01 PM EST ----- Patient referred by Dr. Andrey CampanileWilson in surgery, seen by me on 10/16/16, diagnostic HST on 10/31/17.    Please call and notify the patient that the recent home sleep test showed obstructive sleep apnea. OSA is overall mild, but worth treating to see if she feels better after treatment and especially in light of bariatric surgery planned. To that end I recommend treatment for this in the form of autoPAP, which means, that we don't have to bring her in for a sleep study with CPAP, but will let her try an autoPAP machine at home, through a DME company (of her choice, or as per insurance requirement). The DME representative will educate her on how to use the machine, how to put the mask on, etc. I have placed an order in the chart. Please send referral, talk to patient, send report to referring MD. We will need a FU in sleep clinic for 10 weeks post-PAP set up, please arrange that with me or one of our NPs. Thanks,   Huston FoleySaima Athar, MD, PhD [facility])

## 2017-11-12 NOTE — Telephone Encounter (Signed)
I called pt to discuss her sleep study results. No answer, no VM set up, will try again later. 

## 2017-11-12 NOTE — Progress Notes (Signed)
  Assessment:   3rd SWL Appointment.   Pt arrives having lost about 3 pounds. Pt states HTZ has been added to her medications. Pt states this last month she has been craving sweets a lot. Pt states she had one slice of birthday cake and kept it moving. Pt states she has been eating her 3 meals a day. Pt states she has been playing basketball with her children. Pt states she should meet with a mental health provider for future success.    Sleeve or RYGB Start weight at [facility]: 379.2 Wt: 385.5 BMI: 53.77  MEDICATIONS: See List   DIETARY INTAKE:  24-hr recall:  B ( AM): 3 scrambled eggs and sausage and toast Snk ( AM):  L ( PM): vegetable soup---sour cream cheese black beans quinoa lettuce in spinach wrap Snk ( PM):  D ( PM): chili beans and corn bread  Snk ( PM): cereal Beverages: water flavoring, coffee with creamer  Usual physical activity: cardio at the gym 3 days a weeks   Diet to Follow: 1600 calories 180 g carbohydrates 120 g protein 44 g fat   Nutritional Diagnosis:  [place]-3.3 Overweight/obesity related to past poor dietary habits and physical inactivity as evidenced by patient w/ planned sleeve or RYGB surgery following dietary guidelines for continued weight loss.    Intervention:  Nutrition counseling for upcoming Bariatric Surgery. Goals: -Encouraged to engage in 150 minutes of moderate physical activity including cardiovascular and weight baring weekly - visit this website: [email or web address] Teaching Method Utilized:  Visual Auditory Hands on  Barriers to learning/adherence to lifestyle change: none identified   Demonstrated degree of understanding via:  Teach Back   Monitoring/Evaluation:  Dietary intake, exercise, and body weight prn.

## 2017-11-13 NOTE — Telephone Encounter (Signed)
I called pt again to discuss. No answer, no VM set up yet. Will try again later.

## 2017-11-14 NOTE — Telephone Encounter (Signed)
I called pt. I advised pt that Dr. Frances FurbishAthar reviewed their sleep study results and found that pt did have mild osa. Dr. Frances FurbishAthar recommends that pt start an auto pap at home. I reviewed PAP compliance expectations with the pt. Pt is agreeable to starting an auto-PAP. I advised pt that an order will be sent to a DME, [facility], and [facility] will call the pt within about one week after they file with the pt's insurance. [facility] will show the pt how to use the machine, fit for masks, and troubleshoot the auto-PAP if needed. A follow up appt was made for insurance purposes with Dr. Frances FurbishAthar on 02/06/18 at 9:30am. Pt verbalized understanding to arrive 15 minutes early and bring their auto-PAP. A letter with all of this information in it will be sent to pt's mychart as a reminder. I verified with the pt that the address we have on file is correct. Pt verbalized understanding of results. Pt had no questions at this time but was encouraged to call back if questions arise.

## 2017-12-10 ENCOUNTER — Ambulatory Visit: Payer: Self-pay | Admitting: Obstetrics & Gynecology

## 2017-12-10 ENCOUNTER — Encounter: Payer: Managed Care, Other (non HMO) | Attending: General Surgery | Admitting: Registered"

## 2017-12-10 DIAGNOSIS — Z713 Dietary counseling and surveillance: Secondary | ICD-10-CM | POA: Diagnosis not present

## 2017-12-10 DIAGNOSIS — E669 Obesity, unspecified: Secondary | ICD-10-CM

## 2017-12-10 NOTE — Progress Notes (Signed)
Pre-Operative Nutrition Class:  Appt start time: 8:15   End time: 9:15.  Patient was seen on 12/10/2017 for Pre-Operative [facility]ariatric Surgery [facility].   Surgery date: 12/31/2017 Surgery type: RYGB Start weight at [facility]: 379.2 Weight today: 389.5   Samples given per MNT protocol. Patient educated on appropriate usage: Bariatric Advantage Multivitamin Lot # [id number] Exp: 04/2018  Bariatric Advantage Calcium Citrate Lot # [id number] Exp: 07/20/2018  Bariatric Advantage Calcium Citrate Lot # [id number] Exp: 08/08//2019  Unjury Protein Shake Lot # [id number] Exp: 07/19/2018  The following the learning objectives were met by the patient during this course:  Identify Pre-Op Dietary Goals and will begin 2 weeks pre-operatively  Identify appropriate sources of fluids and proteins   State protein recommendations and appropriate sources pre and post-operatively  Identify Post-Operative Dietary Goals and will follow for 2 weeks post-operatively  Identify appropriate multivitamin and calcium sources  Describe the need for physical activity post-operatively and will follow MD recommendations  State when to call healthcare provider regarding medication questions or post-operative complications  Handouts given during class include:  Pre-Op Bariatric Surgery Diet Handout  Protein Shake Handout  Post-Op Bariatric Surgery Nutrition Handout  BELT Program Information Flyer  Support Group Information Flyer  [facility] Bariatric Supplements Price List  Follow-Up Plan: Patient will follow-up at [facility] 2 weeks post operatively for diet advancement per MD.   

## 2017-12-18 ENCOUNTER — Telehealth: Payer: Self-pay | Admitting: Skilled Nursing Facility1

## 2017-12-19 NOTE — Telephone Encounter (Signed)
Called to answer pts question.  Yes, you can have pickles. LVM

## 2017-12-25 ENCOUNTER — Ambulatory Visit: Payer: Self-pay | Admitting: General Surgery

## 2017-12-25 NOTE — Patient Instructions (Addendum)
Carrie Shaw [place]  12/25/2017   Your procedure is scheduled on: 01/01/2018   [facility]   ARRIVE AT 530 AM. Have a seat in the Main Lobby. Please note there is a phone at the [employer]. Please call [phone] on that phone. Someone from Short Stay will come and get you from the Main Lobby and take you to Short Stay.  Call this number if you have problems the morning of surgery [phone]     Remember:    MORNING OF SURGERY DRINK:  1SHAKE BEFORE YOU LEAVE HOME, DRINK ALL OF THE SHAKE AT ONE TIME.   NO SOLID FOOD AFTER 600 PM THE NIGHT BEFORE YOUR SURGERY. YOU MAY DRINK CLEAR FLUIDS. THE SHAKE YOU DRINK BEFORE YOU LEAVE HOME WILL BE THE LAST FLUIDS YOU DRINK BEFORE SURGERY.  PAIN IS EXPECTED AFTER SURGERY AND WILL NOT BE COMPLETELY ELIMINATED. AMBULATION AND TYLENOL WILL HELP REDUCE INCISIONAL AND GAS PAIN. MOVEMENT IS KEY!  YOU ARE EXPECTED TO BE OUT OF BED WITHIN 4 HOURS OF ADMISSION TO YOUR PATIENT ROOM.  SITTING IN THE RECLINER THROUGHOUT THE DAY IS IMPORTANT FOR DRINKING FLUIDS AND MOVING GAS THROUGHOUT THE GI TRACT.  COMPRESSION STOCKINGS SH[facility] UNLESS YOU ARE WALKING.   INCENTIVE SPIROMETER SHOULD BE USED EVERY HOUR WHILE AWAKE TO DECREASE POST-OPERATIVE COMPLICATIONS SUCH AS PNEUMONIA.  WHEN DISCHARGED HOME, IT IS IMPORTANT TO CONTINUE TO WALK EVERY HOUR AND USE THE INCENTIVE SPIROMETER EVERY HOUR.      CLEAR LIQUID DIET   Foods Allowed                                                                     Foods Excluded  Coffee and tea, regular and decaf                             liquids that you cannot  Plain Jell-O in any flavor                                             see through such as: Fruit ices (not with fruit pulp)                                     milk, soups, orange juice  Iced Popsicles                                    All solid food                                     Cranberry, grape and apple juices Sports drinks like Gatorade Lightly seasoned clear broth or consume(fat free) Sugar, honey syrup  Sample Menu Breakfast  Lunch                                     Supper Cranberry juice                    Beef broth                            Chicken broth Jell-O                                     Grape juice                           Apple juice Coffee or tea                        Jell-O                                      Popsicle                                                Coffee or tea                        Coffee or tea  _____________________________________________________________________      Take these medicines the morning of surgery with A SIP OF WATER: Amlodipine ( NOrvasc)                                 You may not have any metal on your body including hair pins and              piercings  Do not wear jewelry, make-up, lotions, powders or perfumes, deodorant             Do not wear nail polish.  Do not shave  48 hours prior to surgery.     Do not bring valuables to the hospital. [facility] IS NOT             RESPONSIBLE   FOR VALUABLES.  Contacts, dentures or bridgework may not be worn into surgery.  Leave suitcase in the car. After surgery it may be brought to your room.                   Please read over the following fact sheets you were given: _____________________________________________________________________            [facility] - Preparing for Surgery Before surgery, you can play an important role.  Because skin is not sterile, your skin needs to be as free of germs as possible.  You can reduce the number of germs on your skin by washing with CHG (chlorahexidine gluconate) soap before surgery.  CHG is an antiseptic cleaner which kills germs and bonds with the skin to continue killing germs even after washing. Please DO NOT use if you have an allergy to  CHG or  antibacterial soaps.  If your skin becomes reddened/irritated stop using the CHG and inform your nurse when you arrive at Short Stay. Do not shave (including legs and underarms) for at least 48 hours prior to the first CHG shower.  You may shave your face/neck. Please follow these instructions carefully:  1.  Shower with CHG Soap the night before surgery and the  morning of Surgery.  2.  If you choose to wash your hair, wash your hair first as usual with your  normal  shampoo.  3.  After you shampoo, rinse your hair and body thoroughly to remove the  shampoo.                           4.  Use CHG as you would any other liquid soap.  You can apply chg directly  to the skin and wash                       Gently with a scrungie or clean washcloth.  5.  Apply the CHG Soap to your body ONLY FROM THE NECK DOWN.   Do not use on face/ open                           Wound or open sores. Avoid contact with eyes, ears mouth and genitals (private parts).                       Wash face,  Genitals (private parts) with your normal soap.             6.  Wash thoroughly, paying special attention to the area where your surgery  will be performed.  7.  Thoroughly rinse your body with warm water from the neck down.  8.  DO NOT shower/wash with your normal soap after using and rinsing off  the CHG Soap.                9.  Pat yourself dry with a clean towel.            10.  Wear clean pajamas.            11.  Place clean sheets on your bed the night of your first shower and do not  sleep with pets. Day of Surgery : Do not apply any lotions/deodorants the morning of surgery.  Please wear clean clothes to the hospital/surgery center.  FAILURE TO FOLLOW THESE INSTRUCTIONS MAY RESULT IN THE CANCELLATION OF YOUR SURGERY PATIENT SIGNATURE_________________________________  NURSE SIGNATURE__________________________________  ________________________________________________________________________  WHAT IS A  BLOOD TRANSFUSION? Blood Transfusion Information  A transfusion is the replacement of blood or some of its parts. Blood is made up of multiple cells which provide different functions.  Red blood cells carry oxygen and are used for blood loss replacement.  White blood cells fight against infection.  Platelets control bleeding.  Plasma helps clot blood.  Other blood products are available for specialized needs, such as hemophilia or other clotting disorders. BEFORE THE TRANSFUSION  Who gives blood for transfusions?   Healthy volunteers who are fully evaluated to make sure their blood is safe. This is blood bank blood. Transfusion therapy is the safest it has ever been in the practice of medicine. Before blood is taken from a donor, a complete history is taken to make sure that person has no history of  diseases nor engages in risky social behavior (examples are intravenous drug use or sexual activity with multiple partners). The donor's travel history is screened to minimize risk of transmitting infections, such as malaria. The donated blood is tested for signs of infectious diseases, such as HIV and hepatitis. The blood is then tested to be sure it is compatible with you in order to minimize the chance of a transfusion reaction. If you or a relative donates blood, this is often done in anticipation of surgery and is not appropriate for emergency situations. It takes many days to process the donated blood. RISKS AND COMPLICATIONS Although transfusion therapy is very safe and saves many lives, the main dangers of transfusion include:   Getting an infectious disease.  Developing a transfusion reaction. This is an allergic reaction to something in the blood you were given. Every precaution is taken to prevent this. The decision to have a blood transfusion has been considered carefully by your caregiver before blood is given. Blood is not given unless the benefits outweigh the risks. AFTER THE  TRANSFUSION  Right after receiving a blood transfusion, you will usually feel much better and more energetic. This is especially true if your red blood cells have gotten low (anemic). The transfusion raises the level of the red blood cells which carry oxygen, and this usually causes an energy increase.  The nurse administering the transfusion will monitor you carefully for complications. HOME CARE INSTRUCTIONS  No special instructions are needed after a transfusion. You may find your energy is better. Speak with your caregiver about any limitations on activity for underlying diseases you may have. SEEK MEDICAL CARE IF:   Your condition is not improving after your transfusion.  You develop redness or irritation at the intravenous (IV) site. SEEK IMMEDIATE MEDICAL CARE IF:  Any of the following symptoms occur over the next 12 hours:  Shaking chills.  You have a temperature by mouth above 102 F (38.9 C), not controlled by medicine.  Chest, back, or muscle pain.  People around you feel you are not acting correctly or are confused.  Shortness of breath or difficulty breathing.  Dizziness and fainting.  You get a rash or develop hives.  You have a decrease in urine output.  Your urine turns a dark color or changes to pink, red, or brown. Any of the following symptoms occur over the next 10 days:  You have a temperature by mouth above 102 F (38.9 C), not controlled by medicine.  Shortness of breath.  Weakness after normal activity.  The white part of the eye turns yellow (jaundice).  You have a decrease in the amount of urine or are urinating less often.  Your urine turns a dark color or changes to pink, red, or brown. Document Released: 09/15/2000 Document Revised: 12/11/2011 Document Reviewed: 05/04/2008 [employer] Patient Information 2014 [place], [place].  _______________________________________________________________________  Incentive Spirometer  An incentive  spirometer is a tool that can help keep your lungs clear and active. This tool measures how well you are filling your lungs with each breath. Taking long deep breaths may help reverse or decrease the chance of developing breathing (pulmonary) problems (especially infection) following:  A long period of time when you are unable to move or be active. BEFORE THE PROCEDURE   If the spirometer includes an indicator to show your best effort, your nurse or respiratory therapist will set it to a desired goal.  If possible, sit up straight or lean slightly forward. Try not to  slouch.  Hold the incentive spirometer in an upright position. INSTRUCTIONS FOR USE  1. Sit on the edge of your bed if possible, or sit up as far as you can in bed or on a chair. 2. Hold the incentive spirometer in an upright position. 3. Breathe out normally. 4. Place the mouthpiece in your mouth and seal your lips tightly around it. 5. Breathe in slowly and as deeply as possible, raising the piston or the ball toward the top of the column. 6. Hold your breath for 3-5 seconds or for as long as possible. Allow the piston or ball to fall to the bottom of the column. 7. Remove the mouthpiece from your mouth and breathe out normally. 8. Rest for a few seconds and repeat Steps 1 through 7 at least 10 times every 1-2 hours when you are awake. Take your time and take a few normal breaths between deep breaths. 9. The spirometer may include an indicator to show your best effort. Use the indicator as a goal to work toward during each repetition. 10. After each set of 10 deep breaths, practice coughing to be sure your lungs are clear. If you have an incision (the cut made at the time of surgery), support your incision when coughing by placing a pillow or rolled up towels firmly against it. Once you are able to get out of bed, walk around indoors and cough well. You may stop using the incentive spirometer when instructed by your caregiver.   RISKS AND COMPLICATIONS  Take your time so you do not get dizzy or light-headed.  If you are in pain, you may need to take or ask for pain medication before doing incentive spirometry. It is harder to take a deep breath if you are having pain. AFTER USE  Rest and breathe slowly and easily.  It can be helpful to keep track of a log of your progress. Your caregiver can provide you with a simple table to help with this. If you are using the spirometer at home, follow these instructions: SEEK MEDICAL CARE IF:   You are having difficultly using the spirometer.  You have trouble using the spirometer as often as instructed.  Your pain medication is not giving enough relief while using the spirometer.  You develop fever of 100.5 F (38.1 C) or higher. SEEK IMMEDIATE MEDICAL CARE IF:   You cough up bloody sputum that had not been present before.  You develop fever of 102 F (38.9 C) or greater.  You develop worsening pain at or near the incision site. MAKE SURE YOU:   Understand these instructions.  Will watch your condition.  Will get help right away if you are not doing well or get worse. Document Released: 01/29/2007 Document Revised: 12/11/2011 Document Reviewed: 04/01/2007 [facility] Patient Information 2014 [place], [place].   ________________________________________________________________________

## 2017-12-26 ENCOUNTER — Other Ambulatory Visit: Payer: Self-pay

## 2017-12-26 ENCOUNTER — Encounter (HOSPITAL_COMMUNITY)
Admission: RE | Admit: 2017-12-26 | Discharge: 2017-12-26 | Disposition: A | Discharge status: Home / Self Care | Payer: Managed Care, Other (non HMO) | Source: Ambulatory Visit | Attending: General Surgery | Admitting: General Surgery

## 2017-12-26 ENCOUNTER — Encounter (HOSPITAL_COMMUNITY): Payer: Self-pay

## 2017-12-26 DIAGNOSIS — Z01812 Encounter for preprocedural laboratory examination: Secondary | ICD-10-CM | POA: Diagnosis not present

## 2017-12-26 HISTORY — DX: Sleep apnea, unspecified: G47.30

## 2017-12-26 LAB — CBC WITH DIFFERENTIAL/PLATELET
BASOS PCT: 0 %
Basophils Absolute: 0 10*3/uL (ref 0.0–0.1)
EOS PCT: 1 %
Eosinophils Absolute: 0.1 10*3/uL (ref 0.0–0.7)
HCT: 39.7 % (ref 36.0–46.0)
Hemoglobin: 12.4 g/dL (ref 12.0–15.0)
Lymphocytes Relative: 31 %
Lymphs Abs: 2.6 10*3/uL (ref 0.7–4.0)
MCH: 28.1 pg (ref 26.0–34.0)
MCHC: 31.2 g/dL (ref 30.0–36.0)
MCV: 90 fL (ref 78.0–100.0)
MONO ABS: 0.5 10*3/uL (ref 0.1–1.0)
MONOS PCT: 6 %
Neutro Abs: 5.3 10*3/uL (ref 1.7–7.7)
Neutrophils Relative %: 62 %
PLATELETS: 340 10*3/uL (ref 150–400)
RBC: 4.41 MIL/uL (ref 3.87–5.11)
RDW: 16 % — AB (ref 11.5–15.5)
WBC: 8.5 10*3/uL (ref 4.0–10.5)

## 2017-12-26 LAB — COMPREHENSIVE METABOLIC PANEL
ALBUMIN: 3.4 g/dL — AB (ref 3.5–5.0)
ALT: 32 U/L (ref 14–54)
ANION GAP: 6 (ref 5–15)
AST: 26 U/L (ref 15–41)
Alkaline Phosphatase: 51 U/L (ref 38–126)
BILIRUBIN TOTAL: 0.2 mg/dL — AB (ref 0.3–1.2)
BUN: 13 mg/dL (ref 6–20)
CO2: 28 mmol/L (ref 22–32)
Calcium: 8.9 mg/dL (ref 8.9–10.3)
Chloride: 106 mmol/L (ref 101–111)
Creatinine, Ser: 0.65 mg/dL (ref 0.44–1.00)
GFR calc Af Amer: >60 mL/min (ref >60)
GFR calc non Af Amer: >60 mL/min (ref >60)
GLUCOSE: 82 mg/dL (ref 65–99)
POTASSIUM: 3.8 mmol/L (ref 3.5–5.1)
SODIUM: 140 mmol/L (ref 135–145)
TOTAL PROTEIN: 7.7 g/dL (ref 6.5–8.1)

## 2017-12-26 LAB — ABO/RH: ABO/RH(D): O POS

## 2017-12-26 NOTE — Progress Notes (Signed)
Bari bed requested for 01-01-18

## 2017-12-26 NOTE — Progress Notes (Signed)
ekg 09-05-17 epic  cxr 09-05-17 epic

## 2018-01-01 ENCOUNTER — Inpatient Hospital Stay (HOSPITAL_COMMUNITY): Payer: Managed Care, Other (non HMO) | Admitting: Registered Nurse

## 2018-01-01 ENCOUNTER — Inpatient Hospital Stay (HOSPITAL_COMMUNITY)
Admission: RE | Admit: 2018-01-01 | Discharge: 2018-01-03 | DRG: 621 | Disposition: A | Discharge status: Home / Self Care | Payer: Managed Care, Other (non HMO) | Source: Ambulatory Visit | Attending: General Surgery | Admitting: General Surgery

## 2018-01-01 ENCOUNTER — Other Ambulatory Visit: Payer: Self-pay

## 2018-01-01 ENCOUNTER — Encounter (HOSPITAL_COMMUNITY)
Admission: RE | Disposition: A | Discharge status: Home / Self Care | Payer: Self-pay | Source: Ambulatory Visit | Attending: General Surgery

## 2018-01-01 ENCOUNTER — Encounter (HOSPITAL_COMMUNITY): Payer: Self-pay | Admitting: *Deleted

## 2018-01-01 DIAGNOSIS — M25562 Pain in left knee: Secondary | ICD-10-CM | POA: Diagnosis present

## 2018-01-01 DIAGNOSIS — Z6841 Body Mass Index (BMI) 40.0 and over, adult: Secondary | ICD-10-CM

## 2018-01-01 DIAGNOSIS — G8929 Other chronic pain: Secondary | ICD-10-CM | POA: Diagnosis present

## 2018-01-01 DIAGNOSIS — M25561 Pain in right knee: Secondary | ICD-10-CM | POA: Diagnosis present

## 2018-01-01 DIAGNOSIS — I10 Essential (primary) hypertension: Secondary | ICD-10-CM | POA: Diagnosis present

## 2018-01-01 DIAGNOSIS — Z87891 Personal history of nicotine dependence: Secondary | ICD-10-CM

## 2018-01-01 DIAGNOSIS — Z9884 Bariatric surgery status: Secondary | ICD-10-CM

## 2018-01-01 DIAGNOSIS — G4733 Obstructive sleep apnea (adult) (pediatric): Secondary | ICD-10-CM | POA: Diagnosis present

## 2018-01-01 HISTORY — PX: GASTRIC ROUX-EN-Y: SHX5262

## 2018-01-01 LAB — TYPE AND SCREEN
ABO/RH(D): O POS
Antibody Screen: NEGATIVE

## 2018-01-01 LAB — HEMOGLOBIN AND HEMATOCRIT, BLOOD
HCT: 41.4 % (ref 36.0–46.0)
HEMOGLOBIN: 13.6 g/dL (ref 12.0–15.0)

## 2018-01-01 LAB — PREGNANCY, URINE: PREG TEST UR: NEGATIVE

## 2018-01-01 SURGERY — LAPAROSCOPIC ROUX-EN-Y GASTRIC BYPASS WITH UPPER ENDOSCOPY
Anesthesia: General | Site: Abdomen

## 2018-01-01 MED ORDER — ONDANSETRON HCL 4 MG/2ML IJ SOLN
INTRAMUSCULAR | Status: AC
  Filled 2018-01-01: qty 2

## 2018-01-01 MED ORDER — DEXAMETHASONE SODIUM PHOSPHATE 10 MG/ML IJ SOLN
INTRAMUSCULAR | Status: DC | PRN
  Administered 2018-01-01: 10 mg via INTRAVENOUS

## 2018-01-01 MED ORDER — FENTANYL CITRATE (PF) 100 MCG/2ML IJ SOLN
25.0000 ug | INTRAMUSCULAR | Status: DC | PRN
  Administered 2018-01-01: 50 ug via INTRAVENOUS

## 2018-01-01 MED ORDER — MORPHINE SULFATE (PF) 2 MG/ML IV SOLN
1.0000 mg | INTRAVENOUS | Status: DC | PRN
  Administered 2018-01-01: 2 mg via INTRAVENOUS
  Filled 2018-01-01 (×2): qty 1

## 2018-01-01 MED ORDER — PHENYLEPHRINE HCL 10 MG/ML IJ SOLN
INTRAVENOUS | Status: DC | PRN
  Administered 2018-01-01: 20 ug/min via INTRAVENOUS

## 2018-01-01 MED ORDER — 0.9 % SODIUM CHLORIDE (POUR BTL) OPTIME
TOPICAL | Status: DC | PRN
  Administered 2018-01-01: 1000 mL

## 2018-01-01 MED ORDER — MEPERIDINE HCL 50 MG/ML IJ SOLN: 6.2500 mg | INTRAMUSCULAR | Status: DC | PRN

## 2018-01-01 MED ORDER — DEXAMETHASONE SODIUM PHOSPHATE 4 MG/ML IJ SOLN: 4.0000 mg | INTRAMUSCULAR | Status: DC

## 2018-01-01 MED ORDER — FENTANYL CITRATE (PF) 250 MCG/5ML IJ SOLN
INTRAMUSCULAR | Status: AC
  Filled 2018-01-01: qty 5

## 2018-01-01 MED ORDER — LIDOCAINE HCL 2 % IJ SOLN
INTRAMUSCULAR | Status: AC
  Filled 2018-01-01: qty 20

## 2018-01-01 MED ORDER — MIDAZOLAM HCL 5 MG/5ML IJ SOLN
INTRAMUSCULAR | Status: DC | PRN
  Administered 2018-01-01: 2 mg via INTRAVENOUS

## 2018-01-01 MED ORDER — KETAMINE HCL 10 MG/ML IJ SOLN
INTRAMUSCULAR | Status: DC | PRN
  Administered 2018-01-01: 10 mg via INTRAVENOUS
  Administered 2018-01-01: 35 mg via INTRAVENOUS
  Administered 2018-01-01: 20 mg via INTRAVENOUS

## 2018-01-01 MED ORDER — SUGAMMADEX SODIUM 500 MG/5ML IV SOLN
INTRAVENOUS | Status: AC
  Filled 2018-01-01: qty 5

## 2018-01-01 MED ORDER — CHLORHEXIDINE GLUCONATE 4 % EX LIQD: 60.0000 mL | Freq: Once | CUTANEOUS | Status: DC

## 2018-01-01 MED ORDER — FENTANYL CITRATE (PF) 100 MCG/2ML IJ SOLN
INTRAMUSCULAR | Status: AC
Start: 2018-01-01 — End: 2018-01-01
  Filled 2018-01-01: qty 2

## 2018-01-01 MED ORDER — DEXAMETHASONE SODIUM PHOSPHATE 10 MG/ML IJ SOLN
INTRAMUSCULAR | Status: AC
  Filled 2018-01-01: qty 1

## 2018-01-01 MED ORDER — APREPITANT 40 MG PO CAPS
40.0000 mg | ORAL_CAPSULE | ORAL | Status: AC
  Administered 2018-01-01: 40 mg via ORAL
  Filled 2018-01-01: qty 1

## 2018-01-01 MED ORDER — PROPOFOL 10 MG/ML IV BOLUS
INTRAVENOUS | Status: DC | PRN
  Administered 2018-01-01: 200 mg via INTRAVENOUS

## 2018-01-01 MED ORDER — ENOXAPARIN SODIUM 30 MG/0.3ML ~~LOC~~ SOLN
30.0000 mg | Freq: Two times a day (BID) | SUBCUTANEOUS | Status: DC
  Administered 2018-01-01 – 2018-01-03 (×4): 30 mg via SUBCUTANEOUS
  Filled 2018-01-01 (×3): qty 0.3

## 2018-01-01 MED ORDER — SUFENTANIL CITRATE 50 MCG/ML IV SOLN
INTRAVENOUS | Status: AC
  Filled 2018-01-01: qty 1

## 2018-01-01 MED ORDER — LIDOCAINE 2% (20 MG/ML) 5 ML SYRINGE
INTRAMUSCULAR | Status: DC | PRN
  Administered 2018-01-01: 60 mg via INTRAVENOUS

## 2018-01-01 MED ORDER — SUCCINYLCHOLINE CHLORIDE 200 MG/10ML IV SOSY
PREFILLED_SYRINGE | INTRAVENOUS | Status: DC | PRN
  Administered 2018-01-01: 160 mg via INTRAVENOUS

## 2018-01-01 MED ORDER — GABAPENTIN 300 MG PO CAPS
300.0000 mg | ORAL_CAPSULE | ORAL | Status: AC
  Administered 2018-01-01: 300 mg via ORAL
  Filled 2018-01-01: qty 1

## 2018-01-01 MED ORDER — ROCURONIUM BROMIDE 10 MG/ML (PF) SYRINGE
PREFILLED_SYRINGE | INTRAVENOUS | Status: DC | PRN
  Administered 2018-01-01 (×2): 10 mg via INTRAVENOUS
  Administered 2018-01-01 (×2): 20 mg via INTRAVENOUS
  Administered 2018-01-01: 50 mg via INTRAVENOUS

## 2018-01-01 MED ORDER — STERILE WATER FOR IRRIGATION IR SOLN
Status: DC | PRN
  Administered 2018-01-01: 1000 mL

## 2018-01-01 MED ORDER — ACETAMINOPHEN 500 MG PO TABS
1000.0000 mg | ORAL_TABLET | ORAL | Status: AC
  Administered 2018-01-01: 1000 mg via ORAL
  Filled 2018-01-01: qty 2

## 2018-01-01 MED ORDER — SUGAMMADEX SODIUM 200 MG/2ML IV SOLN
INTRAVENOUS | Status: DC | PRN
  Administered 2018-01-01: 700 mg via INTRAVENOUS

## 2018-01-01 MED ORDER — MIDAZOLAM HCL 2 MG/2ML IJ SOLN
INTRAMUSCULAR | Status: AC
  Filled 2018-01-01: qty 2

## 2018-01-01 MED ORDER — PHENYLEPHRINE 40 MCG/ML (10ML) SYRINGE FOR IV PUSH (FOR BLOOD PRESSURE SUPPORT)
PREFILLED_SYRINGE | INTRAVENOUS | Status: DC | PRN
  Administered 2018-01-01: 80 ug via INTRAVENOUS

## 2018-01-01 MED ORDER — EVICEL 5 ML EX KIT
PACK | Freq: Once | CUTANEOUS | Status: AC
  Administered 2018-01-01: 5 mL
  Filled 2018-01-01: qty 1

## 2018-01-01 MED ORDER — LACTATED RINGERS IV SOLN: INTRAVENOUS | Status: DC

## 2018-01-01 MED ORDER — SCOPOLAMINE 1 MG/3DAYS TD PT72
1.0000 | MEDICATED_PATCH | TRANSDERMAL | Status: DC
  Administered 2018-01-01: 1.5 mg via TRANSDERMAL
  Filled 2018-01-01: qty 1

## 2018-01-01 MED ORDER — PREMIER PROTEIN SHAKE
2.0000 [oz_av] | ORAL | Status: DC
  Administered 2018-01-02 – 2018-01-03 (×10): 2 [oz_av] via ORAL

## 2018-01-01 MED ORDER — PROPOFOL 10 MG/ML IV BOLUS
INTRAVENOUS | Status: AC
  Filled 2018-01-01: qty 40

## 2018-01-01 MED ORDER — OXYCODONE HCL 5 MG/5ML PO SOLN
5.0000 mg | ORAL | Status: DC | PRN
  Administered 2018-01-01 – 2018-01-02 (×4): 5 mg via ORAL
  Filled 2018-01-01 (×4): qty 5
  Filled 2018-01-01: qty 10

## 2018-01-01 MED ORDER — KETAMINE HCL 10 MG/ML IJ SOLN
INTRAMUSCULAR | Status: AC
  Filled 2018-01-01: qty 1

## 2018-01-01 MED ORDER — ONDANSETRON HCL 4 MG/2ML IJ SOLN
4.0000 mg | Freq: Four times a day (QID) | INTRAMUSCULAR | Status: DC | PRN
  Administered 2018-01-01 (×2): 4 mg via INTRAVENOUS
  Filled 2018-01-01 (×2): qty 2

## 2018-01-01 MED ORDER — KCL IN DEXTROSE-NACL 20-5-0.45 MEQ/L-%-% IV SOLN
INTRAVENOUS | Status: DC
  Administered 2018-01-01: 18:00:00 via INTRAVENOUS
  Administered 2018-01-02: 125 mL/h via INTRAVENOUS
  Administered 2018-01-02: 1000 mL via INTRAVENOUS
  Filled 2018-01-01 (×4): qty 1000

## 2018-01-01 MED ORDER — PANTOPRAZOLE SODIUM 40 MG IV SOLR
40.0000 mg | Freq: Every day | INTRAVENOUS | Status: DC
  Administered 2018-01-01 – 2018-01-02 (×2): 40 mg via INTRAVENOUS
  Filled 2018-01-01 (×2): qty 40

## 2018-01-01 MED ORDER — PROMETHAZINE HCL 25 MG/ML IJ SOLN
6.2500 mg | INTRAMUSCULAR | Status: AC | PRN
  Administered 2018-01-01 (×2): 12.5 mg via INTRAVENOUS

## 2018-01-01 MED ORDER — FENTANYL CITRATE (PF) 100 MCG/2ML IJ SOLN
INTRAMUSCULAR | Status: DC | PRN
  Administered 2018-01-01: 100 ug via INTRAVENOUS
  Administered 2018-01-01 (×3): 50 ug via INTRAVENOUS
  Administered 2018-01-01: 100 ug via INTRAVENOUS
  Administered 2018-01-01 (×2): 50 ug via INTRAVENOUS

## 2018-01-01 MED ORDER — ACETAMINOPHEN 160 MG/5ML PO SOLN
650.0000 mg | Freq: Four times a day (QID) | ORAL | Status: DC
  Administered 2018-01-01 – 2018-01-03 (×6): 650 mg via ORAL
  Filled 2018-01-01 (×6): qty 20.3

## 2018-01-01 MED ORDER — LIDOCAINE 2% (20 MG/ML) 5 ML SYRINGE
INTRAMUSCULAR | Status: DC | PRN
  Administered 2018-01-01: 1.5 mg/kg/h via INTRAVENOUS

## 2018-01-01 MED ORDER — SODIUM CHLORIDE 0.9 % IJ SOLN
INTRAMUSCULAR | Status: AC
  Filled 2018-01-01: qty 10

## 2018-01-01 MED ORDER — SIMETHICONE 80 MG PO CHEW
80.0000 mg | CHEWABLE_TABLET | Freq: Four times a day (QID) | ORAL | Status: DC | PRN
  Administered 2018-01-02 (×2): 80 mg via ORAL
  Filled 2018-01-01 (×2): qty 1

## 2018-01-01 MED ORDER — ROCURONIUM BROMIDE 10 MG/ML (PF) SYRINGE
PREFILLED_SYRINGE | INTRAVENOUS | Status: AC
  Filled 2018-01-01: qty 5

## 2018-01-01 MED ORDER — ENALAPRILAT 1.25 MG/ML IV SOLN
1.2500 mg | Freq: Four times a day (QID) | INTRAVENOUS | Status: DC | PRN
  Administered 2018-01-02: 1.25 mg via INTRAVENOUS
  Filled 2018-01-01 (×3): qty 1

## 2018-01-01 MED ORDER — SODIUM CHLORIDE 0.9 % IJ SOLN
INTRAMUSCULAR | Status: AC
  Filled 2018-01-01: qty 50

## 2018-01-01 MED ORDER — PHENYLEPHRINE HCL 10 MG/ML IJ SOLN
INTRAMUSCULAR | Status: AC
  Filled 2018-01-01: qty 1

## 2018-01-01 MED ORDER — LACTATED RINGERS IV SOLN
INTRAVENOUS | Status: DC | PRN
  Administered 2018-01-01 (×2): via INTRAVENOUS

## 2018-01-01 MED ORDER — PROMETHAZINE HCL 25 MG/ML IJ SOLN
INTRAMUSCULAR | Status: AC
  Filled 2018-01-01: qty 1

## 2018-01-01 MED ORDER — FENTANYL CITRATE (PF) 100 MCG/2ML IJ SOLN
INTRAMUSCULAR | Status: AC
  Filled 2018-01-01: qty 2

## 2018-01-01 MED ORDER — SODIUM CHLORIDE 0.9 % IJ SOLN
INTRAMUSCULAR | Status: DC | PRN
  Administered 2018-01-01: 50 mL

## 2018-01-01 MED ORDER — HEPARIN SODIUM (PORCINE) 5000 UNIT/ML IJ SOLN
5000.0000 [IU] | INTRAMUSCULAR | Status: AC
  Administered 2018-01-01: 5000 [IU] via SUBCUTANEOUS
  Filled 2018-01-01: qty 1

## 2018-01-01 MED ORDER — PROMETHAZINE HCL 25 MG/ML IJ SOLN
12.5000 mg | Freq: Four times a day (QID) | INTRAMUSCULAR | Status: DC | PRN
  Administered 2018-01-02: 12.5 mg via INTRAVENOUS
  Filled 2018-01-01: qty 1

## 2018-01-01 MED ORDER — DIPHENHYDRAMINE HCL 50 MG/ML IJ SOLN: 12.5000 mg | Freq: Three times a day (TID) | INTRAMUSCULAR | Status: DC | PRN

## 2018-01-01 MED ORDER — LACTATED RINGERS IR SOLN
Status: DC | PRN
  Administered 2018-01-01: 1000 mL

## 2018-01-01 MED ORDER — CEFOTETAN DISODIUM-DEXTROSE 2-2.08 GM-%(50ML) IV SOLR
2.0000 g | INTRAVENOUS | Status: AC
  Administered 2018-01-01: 2 g via INTRAVENOUS
  Filled 2018-01-01: qty 50

## 2018-01-01 MED ORDER — BUPIVACAINE LIPOSOME 1.3 % IJ SUSP
20.0000 mL | Freq: Once | INTRAMUSCULAR | Status: AC
  Administered 2018-01-01: 20 mL
  Filled 2018-01-01: qty 20

## 2018-01-01 MED ORDER — ONDANSETRON HCL 4 MG/2ML IJ SOLN
INTRAMUSCULAR | Status: DC | PRN
  Administered 2018-01-01: 4 mg via INTRAVENOUS

## 2018-01-01 MED ORDER — SUGAMMADEX SODIUM 200 MG/2ML IV SOLN
INTRAVENOUS | Status: AC
  Filled 2018-01-01: qty 2

## 2018-01-01 SURGICAL SUPPLY — 76 items
ADH SKN CLS APL DERMABOND .7 (GAUZE/BANDAGES/DRESSINGS)
APL SKNCLS STERI-STRIP NONHPOA (GAUZE/BANDAGES/DRESSINGS) ×1
APPLIER CLIP ROT 13.4 12 LRG (CLIP)
APR CLP LRG 13.4X12 ROT 20 MLT (CLIP)
BANDAGE ADH SHEER 1  50/CT (GAUZE/BANDAGES/DRESSINGS) ×6 IMPLANT
BENZOIN TINCTURE PRP APPL 2/3 (GAUZE/BANDAGES/DRESSINGS) ×1 IMPLANT
BLADE SURG SZ11 CARB STEEL (BLADE) ×2 IMPLANT
CABLE HIGH FREQUENCY MONO STRZ (ELECTRODE) IMPLANT
CHLORAPREP W/TINT 26ML (MISCELLANEOUS) ×4 IMPLANT
CLIP APPLIE ROT 13.4 12 LRG (CLIP) IMPLANT
CLIP SUT LAPRA TY ABSORB (SUTURE) ×4 IMPLANT
CUTTER FLEX LINEAR 45M (STAPLE) IMPLANT
DERMABOND ADVANCED (GAUZE/BANDAGES/DRESSINGS)
DERMABOND ADVANCED .7 DNX12 (GAUZE/BANDAGES/DRESSINGS) IMPLANT
DEVICE SUT QUICK LOAD TK 5 (STAPLE) IMPLANT
DEVICE SUT TI-KNOT TK 5X26 (MISCELLANEOUS) IMPLANT
DEVICE SUTURE ENDOST 10MM (ENDOMECHANICALS) ×2 IMPLANT
DRAIN PENROSE 18X1/4 LTX STRL (WOUND CARE) ×2 IMPLANT
ELECT REM PT RETURN 15FT ADLT (MISCELLANEOUS) ×2 IMPLANT
GAUZE SPONGE 4X4 12PLY STRL (GAUZE/BANDAGES/DRESSINGS) IMPLANT
GAUZE SPONGE 4X4 16PLY XRAY LF (GAUZE/BANDAGES/DRESSINGS) ×2 IMPLANT
GLOVE BIO SURGEON STRL SZ7.5 (GLOVE) IMPLANT
GLOVE INDICATOR 8.0 STRL GRN (GLOVE) ×2 IMPLANT
GOWN STRL REUS W/TWL XL LVL3 (GOWN DISPOSABLE) ×8 IMPLANT
HOVERMATT SINGLE USE (MISCELLANEOUS) ×2 IMPLANT
KIT BASIN OR (CUSTOM PROCEDURE TRAY) ×2 IMPLANT
KIT GASTRIC LAVAGE 34FR ADT (SET/KITS/TRAYS/PACK) ×2 IMPLANT
LUBRICANT JELLY K Y 4OZ (MISCELLANEOUS) ×2 IMPLANT
MARKER SKIN DUAL TIP RULER LAB (MISCELLANEOUS) ×2 IMPLANT
NDL SPNL 22GX3.5 QUINCKE BK (NEEDLE) ×1 IMPLANT
NEEDLE SPNL 22GX3.5 QUINCKE BK (NEEDLE) ×2 IMPLANT
PACK CARDIOVASCULAR III (CUSTOM PROCEDURE TRAY) ×2 IMPLANT
RELOAD 45 VASCULAR/THIN (ENDOMECHANICALS) IMPLANT
RELOAD ENDO STITCH 2.0 (ENDOMECHANICALS) ×18
RELOAD STAPLE 45 2.5 WHT GRN (ENDOMECHANICALS) IMPLANT
RELOAD STAPLE 45 3.5 BLU ETS (ENDOMECHANICALS) IMPLANT
RELOAD STAPLE 60 2.6 WHT THN (STAPLE) ×2 IMPLANT
RELOAD STAPLE 60 3.6 BLU REG (STAPLE) ×2 IMPLANT
RELOAD STAPLE 60 3.8 GOLD REG (STAPLE) ×1 IMPLANT
RELOAD STAPLE TA45 3.5 REG BLU (ENDOMECHANICALS) IMPLANT
RELOAD STAPLER BLUE 60MM (STAPLE) ×4 IMPLANT
RELOAD STAPLER GOLD 60MM (STAPLE) ×1 IMPLANT
RELOAD STAPLER WHITE 60MM (STAPLE) ×2 IMPLANT
RELOAD SUT SNGL STCH ABSRB 2-0 (ENDOMECHANICALS) ×4 IMPLANT
RELOAD SUT SNGL STCH BLK 2-0 (ENDOMECHANICALS) ×4 IMPLANT
SCISSORS LAP 5X45 EPIX DISP (ENDOMECHANICALS) ×2 IMPLANT
SET IRRIG TUBING LAPAROSCOPIC (IRRIGATION / IRRIGATOR) ×2 IMPLANT
SHEARS HARMONIC ACE PLUS 45CM (MISCELLANEOUS) ×2 IMPLANT
SLEEVE XCEL OPT CAN 5 100 (ENDOMECHANICALS) ×3 IMPLANT
SOLUTION ANTI FOG 6CC (MISCELLANEOUS) ×2 IMPLANT
STAPLER ECHELON BIOABSB 60 FLE (MISCELLANEOUS) IMPLANT
STAPLER ECHELON LONG 60 440 (INSTRUMENTS) ×2 IMPLANT
STAPLER RELOAD BLUE 60MM (STAPLE) ×8
STAPLER RELOAD GOLD 60MM (STAPLE) ×2
STAPLER RELOAD WHITE 60MM (STAPLE) ×4
STRIP CLOSURE SKIN 1/2X4 (GAUZE/BANDAGES/DRESSINGS) ×1 IMPLANT
SUT MNCRL AB 4-0 PS2 18 (SUTURE) ×2 IMPLANT
SUT RELOAD ENDO STITCH 2 48X1 (ENDOMECHANICALS) ×4
SUT RELOAD ENDO STITCH 2.0 (ENDOMECHANICALS) ×5
SUT SURGIDAC NAB ES-9 0 48 120 (SUTURE) IMPLANT
SUT VIC AB 2-0 SH 27 (SUTURE) ×2
SUT VIC AB 2-0 SH 27X BRD (SUTURE) ×1 IMPLANT
SUTURE RELOAD END STTCH 2 48X1 (ENDOMECHANICALS) ×4 IMPLANT
SUTURE RELOAD ENDO STITCH 2.0 (ENDOMECHANICALS) ×5 IMPLANT
SYR 10ML ECCENTRIC (SYRINGE) ×2 IMPLANT
SYR 20CC LL (SYRINGE) ×4 IMPLANT
TIP RIGID 35CM EVICEL (HEMOSTASIS) ×2 IMPLANT
TOWEL OR 17X26 10 PK STRL BLUE (TOWEL DISPOSABLE) ×2 IMPLANT
TOWEL OR NON WOVEN STRL DISP B (DISPOSABLE) ×2 IMPLANT
TROCAR BLADELESS OPT 5 100 (ENDOMECHANICALS) ×2 IMPLANT
TROCAR UNIVERSAL OPT 12M 100M (ENDOMECHANICALS) ×6 IMPLANT
TROCAR XCEL 12X100 BLDLESS (ENDOMECHANICALS) ×2 IMPLANT
TUBE CALIBRATION LAPBAND (TUBING) ×1 IMPLANT
TUBING CONNECTING 10 (TUBING) ×2 IMPLANT
TUBING ENDO SMARTCAP PENTAX (MISCELLANEOUS) ×2 IMPLANT
TUBING INSUF HEATED (TUBING) ×2 IMPLANT

## 2018-01-01 NOTE — Transfer of Care (Signed)
Immediate Anesthesia Transfer of Care Note  Patient: Carrie Shaw  Procedure(s) Performed: LAPAROSCOPIC ROUX-EN-Y GASTRIC BYPASS WITH UPPER ENDOSCOPY (N/A Abdomen)  Patient Location: PACU  Anesthesia Type:General  Level of Consciousness: awake, alert , oriented and patient cooperative  Airway & Oxygen Therapy: Patient Spontanous Breathing and Patient connected to face mask  Post-op Assessment: Report given to RN and Post -op Vital signs reviewed and stable  Post vital signs: Reviewed and stable  Last Vitals:  Vitals Value Taken Time  BP 157/101 01/01/2018 11:02 AM  Temp    Pulse 77 01/01/2018 11:02 AM  Resp 21 01/01/2018 11:02 AM  SpO2 100 % 01/01/2018 11:02 AM  Vitals shown include unvalidated device data.  Last Pain:  Vitals:   01/01/18 0613  TempSrc: Oral      Patients Stated Pain Goal: 5 (01/01/18 0612)  Complications: No apparent anesthesia complications

## 2018-01-01 NOTE — Telephone Encounter (Signed)
Received this notice from [employer]: "I called on 11/29/2017, no answer. LVM. Spoke with the patient on 12/07/2017 and she took down the intake ID to discuss financials with her insurance, on 12/24/2017 I called to follow up, no answer LVM, called on 12/25/2017 to follow up and she let me know that she has not had anytime to call her insurance but when she does she will call me back to schedule. On 12/25/2017 the patient called me back and advised she would not be able to afford this any time soon, I went over used bundle options with her and let her know if anything changes to give me a call back. I am voiding the sales order, just wanted to make you aware."

## 2018-01-01 NOTE — Anesthesia Preprocedure Evaluation (Addendum)
Anesthesia Evaluation  Patient identified by MRN, date of birth, ID band Patient awake    Reviewed: Allergy & Precautions, NPO status , Patient's Chart, lab work & pertinent test results  Airway Mallampati: III  TM Distance: >3 FB Neck ROM: Full    Dental  (+) Teeth Intact, Dental Advisory Given   Pulmonary sleep apnea , former smoker,    breath sounds clear to auscultation       Cardiovascular hypertension,  Rhythm:Regular Rate:Normal     Neuro/Psych negative neurological ROS  negative psych ROS   GI/Hepatic negative GI ROS, Neg liver ROS,   Endo/Other  negative endocrine ROS  Renal/GU negative Renal ROS  negative genitourinary   Musculoskeletal negative musculoskeletal ROS (+)   Abdominal (+) + obese,   Peds  Hematology negative hematology ROS (+)   Anesthesia Other Findings   Reproductive/Obstetrics                            Lab Results  Component Value Date   WBC 8.5 12/26/2017   HGB 12.4 12/26/2017   HCT 39.7 12/26/2017   MCV 90.0 12/26/2017   PLT 340 12/26/2017   Lab Results  Component Value Date   CREATININE 0.65 12/26/2017   BUN 13 12/26/2017   NA 140 12/26/2017   K 3.8 12/26/2017   CL 106 12/26/2017   CO2 28 12/26/2017   No results found for: INR, PROTIME  EKG: normal sinus rhythm, 1st degree AV block.  Anesthesia Physical Anesthesia Plan  ASA: IV  Anesthesia Plan: General   Post-op Pain Management:    Induction: Intravenous  PONV Risk Score and Plan: 4 or greater and Ondansetron, Dexamethasone, Midazolam, Scopolamine patch - Pre-op and Treatment may vary due to age or medical condition  Airway Management Planned: Oral ETT  Additional Equipment: None  Intra-op Plan:   Post-operative Plan: Extubation in OR  Informed Consent: I have reviewed the patients History and Physical, chart, labs and discussed the procedure including the risks, benefits and  alternatives for the proposed anesthesia with the patient or authorized representative who has indicated his/her understanding and acceptance.   Dental advisory given  Plan Discussed with: CRNA  Anesthesia Plan Comments:        Anesthesia Quick Evaluation

## 2018-01-01 NOTE — Discharge Instructions (Signed)
° ° ° °GASTRIC BYPASS/SLEEVE ° Home Care Instructions ° ° These instructions are to help you care for yourself when you go home. ° °Call: If you have any problems. °• Call [phone] and ask for the surgeon on call °• If you need immediate help, come to the ER at [facility].  °• Tell the ER staff that you are a new post-op gastric bypass or gastric sleeve patient °  °Signs and symptoms to report: • Severe vomiting or nausea °o If you cannot keep down clear liquids for longer than 1 day, call your surgeon  °• Abdominal pain that does not get better after taking your pain medication °• Fever over 100.4° F with chills °• Heart beating over 100 beats a minute °• Shortness of breath at rest °• Chest pain °•  Redness, swelling, drainage, or foul odor at incision (surgical) sites °•  If your incisions open or pull apart °• Swelling or pain in calf (lower leg) °• Diarrhea (Loose bowel movements that happen often), frequent watery, uncontrolled bowel movements °• Constipation, (no bowel movements for 3 days) if this happens: Pick one °o Milk of Magnesia, 2 tablespoons by mouth, 3 times a day for 2 days if needed °o Stop taking Milk of Magnesia once you have a bowel movement °o Call your doctor if constipation continues °Or °o Miralax  (instead of Milk of Magnesia) following the label instructions °o Stop taking Miralax once you have a bowel movement °o Call your doctor if constipation continues °• Anything you think is not normal °  °Normal side effects after surgery: • Unable to sleep at night or unable to focus °• Irritability or moody °• Being tearful (crying) or depressed °These are common complaints, possibly related to your anesthesia medications that put you to sleep, stress of surgery, and change in lifestyle.  This usually goes away a few weeks after surgery.  If these feelings continue, call your primary care doctor. °  °Wound Care: You may have surgical glue, steri-strips, or staples over your incisions after  surgery °• Surgical glue:  Looks like a clear film over your incisions and will wear off a little at a time °• Steri-strips: Strips of tape over your incisions. You may notice a yellowish color on the skin under the steri-strips. This is used to make the   steri-strips stick better. Do not pull the steri-strips off - let them fall off °• Staples: Staples may be removed before you leave the hospital °o If you go home with staples, call [facility], [phone] at for an appointment with your surgeon’s nurse to have staples removed 10 days after surgery. °• Showering: You may shower two (2) days after your surgery unless your surgeon tells you differently °o Wash gently around incisions with warm soapy water, rinse well, and gently pat dry  °o No tub baths until staples are removed, steri-strips fall off or glue is gone.  °  °Medications: • Medications should be liquid or crushed if larger than the size of a dime °• Extended release pills (medication that release a little bit at a time through the day) should NOT be crushed or cut. (examples include XL, ER, DR, SR) °• Depending on the size and number of medications you take, you may need to space (take a few throughout the day)/change the time you take your medications so that you do not over-fill your pouch (smaller stomach) °• Make sure you follow-up with your primary care doctor to   make medication changes needed during rapid weight loss and life-style changes °• If you have diabetes, follow up with the doctor that orders your diabetes medication(s) within one week after surgery and check your blood sugar regularly. °• Do not drive while taking prescription pain medication  °• It is ok to take Tylenol by the bottle instructions with your pain medicine or instead of your pain medicine as needed.  DO NOT TAKE NSAIDS (EXAMPLES OF NSAIDS:  IBUPROFREN/ NAPROXEN)  °Diet:                    First 2 Weeks ° You will see the dietician t about two (2) weeks  after your surgery. The dietician will increase the types of foods you can eat if you are handling liquids well: °• If you have severe vomiting or nausea and cannot keep down clear liquids lasting longer than 1 day, call your surgeon @ ([phone]) °Protein Shake °• Drink at least 2 ounces of shake 5-6 times per day °• Each serving of protein shakes (usually 8 - 12 ounces) should have: °o 15 grams of protein  °o And no more than 5 grams of carbohydrate  °• Goal for protein each day: °o Men = 80 grams per day °o Women = 60 grams per day °• Protein powder may be added to fluids such as non-fat milk or Lactaid milk or unsweetened Soy/Almond milk (limit to 35 grams added protein powder per serving) ° °Hydration °• Slowly increase the amount of water and other clear liquids as tolerated (See Acceptable Fluids) °• Slowly increase the amount of protein shake as tolerated  °•  Sip fluids slowly and throughout the day.  Do not use straws. °• May use sugar substitutes in small amounts (no more than 6 - 8 packets per day; i.e. Splenda) ° °Fluid Goal °• The first goal is to drink at least 8 ounces of protein shake/drink per day (or as directed by the nutritionist); some examples of protein shakes are Syntrax Nectar, Adkins Advantage, EAS Edge HP, and Unjury. See handout from pre-op Bariatric Education Class: °o Slowly increase the amount of protein shake you drink as tolerated °o You may find it easier to slowly sip shakes throughout the day °o It is important to get your proteins in first °• Your fluid goal is to drink 64 - 100 ounces of fluid daily °o It may take a few weeks to build up to this °• 32 oz (or more) should be clear liquids  °And  °• 32 oz (or more) should be full liquids (see below for examples) °• Liquids should not contain sugar, caffeine, or carbonation ° °Clear Liquids: °• Water or Sugar-free flavored water (i.e. Fruit H2O, Propel) °• Decaffeinated coffee or tea (sugar-free) °• Crystal Lite, Wyler’s Lite,  Minute Maid Lite °• Sugar-free Jell-O °• Bouillon or broth °• Sugar-free Popsicle:   *Less than 20 calories each; Limit 1 per day ° °Full Liquids: °Protein Shakes/Drinks + 2 choices per day of other full liquids °• Full liquids must be: °o No More Than 15 grams of Carbs per serving  °o No More Than 3 grams of Fat per serving °• Strained low-fat cream soup (except Cream of Potato or Tomato) °• Non-Fat milk °• Fat-free Lactaid Milk °• Unsweetened Soy Or Unsweetened Almond Milk °• Low Sugar yogurt (Dannon Lite & Fit, Greek yogurt; Oikos Triple Zero; Chobani Simply 100; Yoplait 100 calorie Greek - No Fruit on the Bottom) ° °  °Vitamins   and Minerals • Start 1 day after surgery unless otherwise directed by your surgeon °• 2 Chewable Bariatric Specific Multivitamin / Multimineral Supplement with iron (Example: Bariatric Advantage Multi EA) °• Chewable Calcium with Vitamin D-3 °(Example: 3 Chewable Calcium Plus 600 with Vitamin D-3) °o Take 500 mg three (3) times a day for a total of 1500 mg each day °o Do not take all 3 doses of calcium at one time as it may cause constipation, and you can only absorb 500 mg  at a time  °o Do not mix multivitamins containing iron with calcium supplements; take 2 hours apart °• Menstruating women and those with a history of anemia (a blood disease that causes weakness) may need extra iron °o Talk with your doctor to see if you need more iron °• Do not stop taking or change any vitamins or minerals until you talk to your dietitian or surgeon °• Your Dietitian and/or surgeon must approve all vitamin and mineral supplements °  °Activity and Exercise: Limit your physical activity as instructed by your doctor.  It is important to continue walking at home.  During this time, use these guidelines: °• Do not lift anything greater than ten (10) pounds for at least two (2) weeks °• Do not go back to work or drive until your surgeon says you can °• You may have sex when you feel comfortable  °o It is  VERY important for female patients to use a reliable birth control method; fertility often increases after surgery  °o All hormonal birth control will be ineffective for 30 days after surgery due to medications given during surgery a barrier method must be used. °o Do not get pregnant for at least 18 months °• Start exercising as soon as your doctor tells you that you can °o Make sure your doctor approves any physical activity °• Start with a simple walking program °• Walk 5-15 minutes each day, 7 days per week.  °• Slowly increase until you are walking 30-45 minutes per day °Consider joining our BELT program. [phone] or email [email or web address] °  °Special Instructions Things to remember: °• Use your CPAP when sleeping if this applies to you ° °• [facility] has two free Bariatric Surgery Support Groups that meet monthly °o The 3rd Thursday of each month, 6 pm, [facility]  °o The 2nd Friday of each month, 11:45 am in the private dining room in the basement of [facility] °• It is very important to keep all follow up appointments with your surgeon, dietitian, primary care physician, and behavioral health practitioner °• Routine follow up schedule with your surgeon include appointments at 2-3 weeks, 6-8 weeks, 6 months, and 1 year at a minimum.  Your surgeon may request to see you more often.   °o After the first year, please follow up with your bariatric surgeon and dietitian at least once a year in order to maintain best weight loss results °[facility]: [phone] °[facility]: [phone] °Bariatric Nurse Coordinator: [phone] °  °   Reviewed and Endorsed  °by [facility] Patient Education Committee, June, 2016 °Edits Approved: Aug, 2018 ° ° ° °

## 2018-01-01 NOTE — Anesthesia Procedure Notes (Signed)
Procedure Name: Intubation Date/Time: 01/01/2018 7:45 AM Performed by: Vanessa Durhamochran, Neftaly Inzunza Glenn, CRNA Pre-anesthesia Checklist: Patient identified, Emergency Drugs available, Suction available and Patient being monitored Patient Re-evaluated:Patient Re-evaluated prior to induction Oxygen Delivery Method: Circle system utilized Preoxygenation: Pre-oxygenation with 100% oxygen Induction Type: IV induction Ventilation: Mask ventilation without difficulty Laryngoscope Size: 2 and Miller Grade View: Grade I Tube type: Oral Tube size: 7.5 mm Number of attempts: 1 Airway Equipment and Method: Stylet Placement Confirmation: ETT inserted through vocal cords under direct vision,  positive ETCO2 and breath sounds checked- equal and bilateral Secured at: 23 cm Tube secured with: Tape Dental Injury: Teeth and Oropharynx as per pre-operative assessment

## 2018-01-01 NOTE — Op Note (Signed)
Quaniya Lucinda Spells [id number] 07/16/1986. 01/01/2018  Preoperative diagnosis:  1. Morbid obesity (BMI 54)  2. Mild obstructive sleep apnea 3. Bilateral chronic knee pain   Postoperative  diagnosis:  1. same  Surgical procedure: Laparoscopic Roux-en-Y gastric bypass (ante-colic, ante-gastric); upper endoscopy  Surgeon: Atilano Ina, M.D. FACS  Asst.: Phylliss Blakes MD   Anesthesia: General plus exparel  Complications: None   EBL: <50cc   Drains: None   Disposition: PACU in good condition   Indications for procedure: 32yo female with morbid obesity who has been unsuccessful at sustained weight loss. The patient's comorbidities are listed above. We discussed the risk and benefits of surgery including but not limited to anesthesia risk, bleeding, infection, blood clot formation, anastomotic leak, anastomotic stricture, ulcer formation, death, respiratory complications, intestinal blockage, internal hernia, gallstone formation, vitamin and nutritional deficiencies, injury to surrounding structures, failure to lose weight and mood changes.   Description of procedure: Patient is brought to the operating room and general anesthesia induced. The patient had received preoperative broad-spectrum IV antibiotics and subcutaneous heparin. The abdomen was widely sterilely prepped with Chloraprep and draped. Patient timeout was performed and correct patient and procedure confirmed. Access was obtained with a 12 mm Optiview trocar in the left upper quadrant and pneumoperitoneum established without difficulty. Under direct vision 12 mm trocars were placed laterally in the right upper quadrant, right upper quadrant midclavicular line, and to the left and above the umbilicus for the camera port. A 5 mm trocar was placed laterally in the left upper quadrant.  She had a prior exploratory laparotomy due to an ectopic pregnancy back in December with an iatrogenic bladder injury that required repair.  The  omentum was stuck to the lower midline and into the pelvis.  The omentum was taken down from the anterior abdominal wall and from surrounding pelvic structures taking care not to injure any surrounding structures.  This took about 20 minutes to free the omentum.  Exparel was then placed in bilateral lateral upper abdominal walls as a regional block.  The omentum was brought into the upper abdomen and the transverse mesocolon elevated and the ligament of Treitz clearly identified. A 40 cm biliopancreatic limb was then carefully measured from the ligament of Treitz. The small intestine was divided at this point with a single firing of the white load linear stapler. A Penrose drain was sutured to the end of the Roux-en-Y limb for later identification. A 100 cm Roux-en-Y limb was then carefully measured. At this point a side-to-side anastomosis was created between the Roux limb and the end of the biliopancreatic limb. This was accomplished with a single firing of the 60 mm white load linear stapler. The common enterotomy was closed with a running 2-0 Vicryl begun at either end of the enterotomy and tied centrally. Eviceal tissue sealant was placed over the anastomosis. The mesenteric defect was then closed with running 2-0 silk. The omentum was then divided with the harmonic scalpel up towards the transverse colon to allow mobility of the Roux limb toward the gastric pouch. The patient was then placed in steep reversed Trendelenburg. Through a 5 mm subxiphoid site the [facility] retractor was placed and the left lobe of the liver elevated with excellent exposure of the upper stomach and hiatus.  Her upper GI did not demonstrate any evidence of a hiatal hernia.  We did also test her intraoperatively for a hiatal hernia by placing the calibration tube down her oropharynx into her esophagus into her proximal stomach.  The balloon was inflated with 10 cc of air.  The calibration tube was then pulled back with the balloon  inflated up toward her gastroesophageal junction.  The tube stayed within the stomach and the abdominal cavity.  There is no evidence of a hiatal hernia.  The balloon was deflated and the calibration tube was removed.  The angle of Hiss was then mobilized with the harmonic scalpel. A 4 cm gastric pouch was then carefully measured along the lesser curve of the stomach. Dissection was carried along the lesser curve at this point with the Harmonic scalpel working carefully back toward the lesser sac at right angles to the lesser curve. The free lesser sac was then entered. After being sure all tubes were removed from the stomach an initial firing of the gold load 60 mm linear stapler was fired at right angles across the lesser curve for about 4 cm. The gastric pouch was further mobilized posteriorly and then the pouch was completed with 3 further firings of the 60 mm blue load linear stapler  up through the previously dissected angle of His. It was ensured that the pouch was completely mobilized away from the gastric remnant. This created a nice tubular 4-5 cm gastric pouch. The Roux limb was then brought up in an antecolic fashion with the candycane facing to the patient's left without undue tension. The gastrojejunostomy was created with an initial posterior row of 2-0 Vicryl between the Roux limb and the staple line of the gastric pouch. Enterotomies were then made in the gastric pouch and the Roux limb with the harmonic scalpel and at approximately 2-2-1/2 cm anastomosis was created with a single firing of the 60mm blue load linear stapler. The staple line was inspected and was intact without bleeding. The common enterotomy was then closed with running 2-0 Vicryl begun at either end and tied centrally. The Ewall tube was then easily passed through the anastomosis and an outer anterior layer of running 2-0 Vicryl was placed. The Ewald tube was removed. With the outlet of the gastrojejunostomy clamped and under  saline irrigation the assistant performed upper endoscopy and with the gastric pouch tensely distended with air-there was no evidence of leak on this test. The pouch was desufflated. The Vonita Mosseterson defect was closed with running 2-0 silk. The abdomen was inspected for any evidence of bleeding or bowel injury and everything looked fine. The Nathanson retractor was removed under direct vision after coating the anastomosis with Tisseel tissue sealant. All CO2 was evacuated and trochars removed. Skin incisions were closed with 4-0 monocryl in a subcuticular fashion followed by benzoin, steri-strips and bandages. Sponge needle and instrument counts were correct. The patient was taken to the PACU in good condition.    Mary SellaEric M. Andrey CampanileWilson, MD, FACS General, Bariatric, & Minimally Invasive Surgery [facility] [place]

## 2018-01-01 NOTE — Anesthesia Postprocedure Evaluation (Signed)
Anesthesia Post Note  Patient: Carrie Shaw  Procedure(s) Performed: LAPAROSCOPIC ROUX-EN-Y GASTRIC BYPASS WITH UPPER ENDOSCOPY (N/A Abdomen)     Patient location during evaluation: PACU Anesthesia Type: General Level of consciousness: awake and alert Pain management: pain level controlled Vital Signs Assessment: post-procedure vital signs reviewed and stable Respiratory status: spontaneous breathing, nonlabored ventilation, respiratory function stable and patient connected to nasal cannula oxygen Cardiovascular status: blood pressure returned to baseline and stable Postop Assessment: no apparent nausea or vomiting Anesthetic complications: no    Last Vitals:  Vitals:   01/01/18 1200 01/01/18 1223  BP: 137/76 (!) 148/99  Pulse: 93 89  Resp: 20 18  Temp: 37 C 36.9 C  SpO2: 95% 97%    Last Pain:  Vitals:   01/01/18 1200  TempSrc:   PainSc: Asleep                 Shelton SilvasKevin D Skanda Worlds

## 2018-01-01 NOTE — Interval H&P Note (Signed)
History and Physical Interval Note:  01/01/2018 7:36 AM  Carrie Shaw  has presented today for surgery, with the diagnosis of MORBID OBESITY  The various methods of treatment have been discussed with the patient and family. After consideration of risks, benefits and other options for treatment, the patient has consented to  Procedure(s): LAPAROSCOPIC ROUX-EN-Y GASTRIC BYPASS WITH UPPER ENDOSCOPY (N/A) as a surgical intervention .  The patient's history has been reviewed, patient examined, no change in status, stable for surgery.  I have reviewed the patient's chart and labs.  Questions were answered to the patient's satisfaction.    Urine nicotine test - negative No other changes.   Mary SellaEric M. Andrey CampanileWilson, MD, FACS General, Bariatric, & Minimally Invasive Surgery [facility] [place]  Gaynelle AduEric Shronda Boeh

## 2018-01-01 NOTE — Op Note (Signed)
Preoperative diagnosis: Roux-en-Y gastric bypass  Postoperative diagnosis: Same   Procedure: Upper endoscopy   Surgeon: Berna Buehelsea A Hilario Robarts, M.D.  Anesthesia: Gen.   Description of procedure: The endoscopy was placed in the mouth and into the oropharynx and under endoscopic vision it was advanced to the esophagogastric junction.  The pouch was insufflated and no bleeding or bubbles were seen.  The GEJ was identified at 46cm from the teeth. The gastrojejunal anastamosis was approximately 50cm from the teeth and was visibly patent and hemostatic. No bleeding or leaks were detected. The scope was withdrawn without difficulty.    Berna Buehelsea A Mcarthur Ivins, M.D. General, Bariatric, & Minimally Invasive Surgery [facility] [place]

## 2018-01-01 NOTE — H&P (Signed)
Carrie Shaw Documented: 12/19/2017 11:15 AM Location: [facility] Patient #: [id number] DOB: 08/20/1986 Single / Language: Carrie Shaw / Race: Black or African American Female   History of Present Illness Carrie Hiss M. Resean Brander MD; 12/20/2017 2:09 PM) The patient is a 32 year old female who presents for a bariatric surgery evaluation. She comes in today for a preoperative appointment. I initially met her in November 2018. After that consultation she has decided that she would like to proceed with the laparoscopic Roux-en-Y gastric bypass. She states that she stop smoking about a month ago. She had a sleep study that showed mild obstructive sleep apnea. She had an ectopic pregnancy about a week or so after her visit with me requiring emergent exploratory laparotomy. She unfortunately had intraoperative bladder injury requiring bladder repair. She had some adhesions in her pelvis. She was discharged from hospital November 17. She otherwise states she is doing well. She denies any nausea, vomiting, diarrhea or constipation. She denies any reflux. Her upper GI and chest x-ray were unremarkable. Bariatric evaluation labs were unremarkable   08/02/2017 She is referred by Carrie Peng NP for evaluation of weight loss surgery. She completed our seminar in person. She is more interested in the sleeve gastrectomy. She has struggled with her weight for many years. Despite several attempts for sustained weight loss she has been unsuccessful. She has tried Carrie Shaw, Carrie Shaw, keto diet as well as over-the-counter dietary supplements-all without any long-term success. Her comorbidities include bilateral knee pain  She denies any chest pain, chest pressure, shortness of breath, orthopnea, paroxysmal nocturnal dyspnea, dyspnea on exertion, TIAs or amaurosis fugax. She denies any prior history of blood clots or family history of blood clots. She denies any peripheral edema. She denies any  heartburn or indigestion or reflux. She has daily bowel movements. She has had a prior appendectomy. She denies any melena or hematochezia. She denies any dysuria or hematuria. She is a G2 P2. She denies any menstrual irregularities. She has bilateral knee pain right greater than left. She also complains of mid thoracic back pain. She denies any blurry vision, severe headaches. She drinks alcohol on a rare occasion. She does smoke. A pack of cigarettes may last 3-4 days.  Her sleep apnea screening questionnaire was positive   Problem List/Past Medical Carrie Hiss M. Redmond Pulling, MD; 12/20/2017 2:12 PM) MORBID OBESITY (E66.01)  The patient meets weight loss surgery criteria. I think the patient would be an acceptable candidate for Laparoscopic Roux-en-Y Gastric bypass.  We discussed laparoscopic Roux-en-Y gastric bypass. We discussed the preoperative, operative and postoperative process. Using diagrams, I explained the surgery in detail including the performance of an EGD near the end of the surgery. We discussed the typical hospital course including a 2-3 day stay baring any complications. The patient was given educational material. I quoted the patient that they can expect to lose 50-70% of their excess weight with the gastric bypass. We did discuss the possibility of weight regain several years after the procedure.  We discussed the risk and benefits of surgery including but not limited to anesthesia risk, bleeding, infection, anastomotic edema requiring a few additional days in the hospital, postop nausea, possible conversion to open procedure, blood clot formation, anastomotic leak, anastomotic stricture, ulcer formation, death, respiratory complications, intestinal blockage, internal hernia, gallstone formation, vitamin and nutritional deficiencies, hair loss, weight regain injury to surrounding structures, failure to lose weight and mood changes.  We discussed that before and after surgery that  there would be an  alteration in their diet. I explained that we have put them on a diet 2 weeks before surgery. I also explained that they would be on a liquid diet for 2 weeks after surgery. We discussed that they would have to avoid certain foods such as sugar after surgery. We discussed the importance of physical activity as well as compliance with our dietary and supplement recommendations and routine follow-up. BILATERAL CHRONIC KNEE PAIN (M25.561, M25.562)  FATIGUE, UNSPECIFIED TYPE (R53.83)  TOBACCO USE (Z72.0)  MILD OBSTRUCTIVE SLEEP APNEA (G47.33)   Past Surgical History Carrie Hiss M. Redmond Pulling, MD; 12/20/2017 2:12 PM) Appendectomy   Diagnostic Studies History Carrie Hiss M. Redmond Pulling, MD; 12/20/2017 2:12 PM) Colonoscopy  never Mammogram  never Pap Smear  1-5 years ago  Allergies Carrie Shaw, CMA; 12/19/2017 11:15 AM) No Known Drug Allergies [12/19/2017]: Allergies Reconciled   Medication History Carrie Hiss M. Redmond Pulling, MD; 12/20/2017 2:12 PM) HydroCHLOROthiazide ('25MG'$  Tablet, Oral) Active. AmLODIPine Besylate ('5MG'$  Tablet, Oral) Active. No Current Medications (Taken starting 12/19/2017) Amoxicillin ('500MG'$  Tablet, Oral) Active. Medications Reconciled OxyCODONE HCl ('5MG'$ /5ML Solution, 5-10 Oral every four hours, as needed, Taken starting 12/19/2017) Active. Pantoprazole Sodium ('40MG'$  Tablet DR, 1 (one) Oral daily, Taken starting 12/19/2017) Active. Ondansetron ('4MG'$  Tablet Disint, 1 (one) Oral every six hours, as needed, Taken starting 12/19/2017) Active.  Social History Carrie Hiss M. Redmond Pulling, MD; 12/20/2017 2:12 PM) Alcohol use  Occasional alcohol use. Caffeine use  Coffee, Tea. Illicit drug use  Remotely quit drug use. Tobacco use  Current some day smoker.  Family History Carrie Hiss M. Redmond Pulling, MD; 12/20/2017 2:12 PM) Alcohol Abuse  Family Members In General, Father. Arthritis  Family Members In General, Mother. Cerebrovascular Accident  Family Members In [place] Members In General. Colon Polyps  Family Members In General, Mother. Depression  Family Members In General, Father, Mother, Sister. Diabetes Mellitus  Family Members In General. Hypertension  Family Members In General, Father, Mother. Prostate Cancer  Family Members In General. Respiratory Condition  Family Members In General. Seizure disorder  Family Members In General.  Pregnancy / Birth History Leighton Ruff. Redmond Pulling, MD; 12/20/2017 2:12 PM) Age at menarche  77 years. Gravida  4 Maternal age  49-20 Para  2 Regular periods   Other Problems Carrie Hiss M. Redmond Pulling, MD; 12/20/2017 2:12 PM) Cholelithiasis     Review of Systems Carrie Hiss M. Edlyn Rosenburg MD; 12/20/2017 2:09 PM) General Not Present- Appetite Loss, Chills, Fatigue, Fever, Night Sweats, Weight Gain and Weight Loss. Skin Present- Dryness. Not Present- Change in Wart/Mole, Hives, Jaundice, New Lesions, Non-Healing Wounds, Rash and Ulcer. HEENT Not Present- Earache, Hearing Loss, Hoarseness, Nose Bleed, Oral Ulcers, Ringing in the Ears, Seasonal Allergies, Sinus Pain, Sore Throat, Visual Disturbances, Wears glasses/contact lenses and Yellow Eyes. Breast Not Present- Breast Mass, Breast Pain, Nipple Discharge and Skin Changes. Cardiovascular Not Present- Chest Pain, Difficulty Breathing Lying Down, Leg Cramps, Palpitations, Rapid Heart Rate, Shortness of Breath and Swelling of Extremities. Gastrointestinal Not Present- Abdominal Pain, Bloating, Bloody Stool, Change in Bowel Habits, Chronic diarrhea, Constipation, Difficulty Swallowing, Excessive gas, Gets full quickly at meals, Hemorrhoids, Indigestion, Nausea, Rectal Pain and Vomiting. Female Genitourinary Not Present- Frequency, Nocturia, Painful Urination, Pelvic Pain and Urgency. Musculoskeletal Present- Back Pain and Joint Pain. Not Present- Joint Stiffness, Muscle Pain, Muscle Weakness and Swelling of Extremities. Neurological Not Present- Decreased Memory, Fainting, Headaches,  Numbness, Seizures, Tingling, Tremor, Trouble walking and Weakness. Psychiatric Not Present- Anxiety, Bipolar, Change in Sleep Pattern, Depression, Fearful and Frequent crying. Endocrine Not Present- Cold Intolerance, Excessive Hunger,  Hair Changes, Heat Intolerance, Hot flashes and New Diabetes. All other systems negative  Vitals Andee Poles Gerrigner CMA; 12/19/2017 11:16 AM) 12/19/2017 11:15 AM Weight: 384.13 lb Height: 71in Body Surface Area: 2.78 m Body Mass Index: 53.57 kg/m  Temp.: 98.38F(Oral)  Pulse: 87 (Regular)  BP: 128/82 (Sitting, Right Wrist, Standard)       Physical Exam Carrie Hiss M. Ashwika Freels MD; 12/20/2017 2:09 PM) General Mental Status-Alert. General Appearance-Consistent with stated age. Hydration-Well hydrated. Voice-Normal. Note: severe obesity; evenly distributed - more pear shaped   Head and Neck Head-normocephalic, atraumatic with no lesions or palpable masses. Trachea-midline. Thyroid Gland Characteristics - normal size and consistency.  Eye Eyeball - Bilateral-Extraocular movements intact. Sclera/Conjunctiva - Bilateral-No scleral icterus.  ENMT Ears -Note: normal external ears.  Mouth and Throat -Note: lips intact.   Chest and Lung Exam Chest and lung exam reveals -quiet, even and easy respiratory effort with no use of accessory muscles and on auscultation, normal breath sounds, no adventitious sounds and normal vocal resonance. Inspection Chest Wall - Normal. Back - normal.  Breast - Did not examine.  Cardiovascular Cardiovascular examination reveals -normal heart sounds, regular rate and rhythm with no murmurs and normal pedal pulses bilaterally.  Abdomen Inspection Inspection of the abdomen reveals - No Hernias. Skin - Scar - no surgical scars. Palpation/Percussion Palpation and Percussion of the abdomen reveal - Soft, Non Tender, No Rebound tenderness, No Rigidity (guarding) and No  hepatosplenomegaly. Auscultation Auscultation of the abdomen reveals - Bowel sounds normal.  Peripheral Vascular Upper Extremity Palpation - Pulses bilaterally normal.  Neurologic Neurologic evaluation reveals -alert and oriented x 3 with no impairment of recent or remote memory. Mental Status-Normal.  Neuropsychiatric The patient's mood and affect are described as -normal. Judgment and Insight-insight is appropriate concerning matters relevant to self.  Musculoskeletal Normal Exam - Left-Upper Extremity Strength Normal and Lower Extremity Strength Normal. Normal Exam - Right-Upper Extremity Strength Normal and Lower Extremity Strength Normal. Note: bilateral knee crepitus, L>R   Lymphatic Head & Neck  General Head & Neck Lymphatics: Bilateral - Description - Normal. Axillary - Did not examine. Femoral & Inguinal - Did not examine.    Assessment & Plan Carrie Hiss M. Janese Radabaugh MD; 12/20/2017 2:12 PM) MORBID OBESITY (E66.01) Story: The patient meets weight loss surgery criteria. I think the patient would be an acceptable candidate for Laparoscopic Roux-en-Y Gastric bypass.  We discussed laparoscopic Roux-en-Y gastric bypass. We discussed the preoperative, operative and postoperative process. Using diagrams, I explained the surgery in detail including the performance of an EGD near the end of the surgery. We discussed the typical hospital course including a 2-3 day stay baring any complications. The patient was given educational material. I quoted the patient that they can expect to lose 50-70% of their excess weight with the gastric bypass. We did discuss the possibility of weight regain several years after the procedure.  We discussed the risk and benefits of surgery including but not limited to anesthesia risk, bleeding, infection, anastomotic edema requiring a few additional days in the hospital, postop nausea, possible conversion to open procedure, blood clot formation,  anastomotic leak, anastomotic stricture, ulcer formation, death, respiratory complications, intestinal blockage, internal hernia, gallstone formation, vitamin and nutritional deficiencies, hair loss, weight regain injury to surrounding structures, failure to lose weight and mood changes.  We discussed that before and after surgery that there would be an alteration in their diet. I explained that we have put them on a diet 2 weeks before surgery. I also explained that  they would be on a liquid diet for 2 weeks after surgery. We discussed that they would have to avoid certain foods such as sugar after surgery. We discussed the importance of physical activity as well as compliance with our dietary and supplement recommendations and routine follow-up. Impression: I believe she is still a candidate for Roux-en-Y gastric bypass surgery. I did review her operative note from her ectopic pregnancy. We may have some omental adhesions to deal with as well as mobilizing the omentum out of the pelvis but I think that it is not risk prohibitive. I congratulated her for stopping smoking however we will need to confirm this with a urine nicotine test prior to proceeding with surgery. Otherwise we rediscussed the typical hospitalization and the typical recovery period. I rediscussed the typical diet progression. Current Plans Started OxyCODONE HCl 5 MG/5ML Oral Solution, 5-10 Milliliter every four hours, as needed, 120 Milliliter, 12/19/2017, No Refill. Started Pantoprazole Sodium 40 MG Oral Tablet Delayed Release, 1 (one) Tablet daily, #30, 12/19/2017, Ref. x2. Started Ondansetron 4 MG Oral Tablet Disintegrating, 1 (one) Tablet every six hours, as needed, #15, 12/19/2017, No Refill. FATIGUE, UNSPECIFIED TYPE (R53.83) BILATERAL CHRONIC KNEE PAIN (M25.561) TOBACCO USE (Z72.0) Impression: due for urine nicotine Current Plans NICOTINE (23536) MILD OBSTRUCTIVE SLEEP APNEA (G47.33) Impression: per [place]  neurologic  Leighton Ruff. Redmond Pulling, MD, FACS General, Bariatric, & Minimally Invasive Surgery [facility], [place]

## 2018-01-02 ENCOUNTER — Encounter (HOSPITAL_COMMUNITY): Payer: Self-pay | Admitting: General Surgery

## 2018-01-02 LAB — COMPREHENSIVE METABOLIC PANEL
ALBUMIN: 3.2 g/dL — AB (ref 3.5–5.0)
ALK PHOS: 47 U/L (ref 38–126)
ALT: 212 U/L — AB (ref 14–54)
AST: 202 U/L — AB (ref 15–41)
Anion gap: 8 (ref 5–15)
BILIRUBIN TOTAL: 0.6 mg/dL (ref 0.3–1.2)
BUN: 9 mg/dL (ref 6–20)
CALCIUM: 8.8 mg/dL — AB (ref 8.9–10.3)
CO2: 24 mmol/L (ref 22–32)
Chloride: 105 mmol/L (ref 101–111)
Creatinine, Ser: 0.64 mg/dL (ref 0.44–1.00)
GFR calc Af Amer: >60 mL/min (ref >60)
GFR calc non Af Amer: >60 mL/min (ref >60)
GLUCOSE: 114 mg/dL — AB (ref 65–99)
Potassium: 4 mmol/L (ref 3.5–5.1)
SODIUM: 137 mmol/L (ref 135–145)
TOTAL PROTEIN: 7.5 g/dL (ref 6.5–8.1)

## 2018-01-02 LAB — CBC WITH DIFFERENTIAL/PLATELET
BASOS ABS: 0 10*3/uL (ref 0.0–0.1)
BASOS PCT: 0 %
EOS ABS: 0 10*3/uL (ref 0.0–0.7)
Eosinophils Relative: 0 %
HEMATOCRIT: 36.1 % (ref 36.0–46.0)
HEMOGLOBIN: 12 g/dL (ref 12.0–15.0)
Lymphocytes Relative: 14 %
Lymphs Abs: 2.1 10*3/uL (ref 0.7–4.0)
MCH: 28.7 pg (ref 26.0–34.0)
MCHC: 33.2 g/dL (ref 30.0–36.0)
MCV: 86.4 fL (ref 78.0–100.0)
MONOS PCT: 8 %
Monocytes Absolute: 1.2 10*3/uL — ABNORMAL HIGH (ref 0.1–1.0)
NEUTROS ABS: 11.7 10*3/uL — AB (ref 1.7–7.7)
NEUTROS PCT: 78 %
Platelets: 340 10*3/uL (ref 150–400)
RBC: 4.18 MIL/uL (ref 3.87–5.11)
RDW: 16.2 % — ABNORMAL HIGH (ref 11.5–15.5)
WBC: 14.9 10*3/uL — AB (ref 4.0–10.5)

## 2018-01-02 MED ORDER — HYDROCHLOROTHIAZIDE 25 MG PO TABS
25.0000 mg | ORAL_TABLET | Freq: Every day | ORAL | Status: DC
  Administered 2018-01-02 – 2018-01-03 (×2): 25 mg via ORAL
  Filled 2018-01-02 (×2): qty 1

## 2018-01-02 MED ORDER — AMLODIPINE BESYLATE 5 MG PO TABS
5.0000 mg | ORAL_TABLET | Freq: Every day | ORAL | Status: DC
  Administered 2018-01-02 – 2018-01-03 (×2): 5 mg via ORAL
  Filled 2018-01-02 (×2): qty 1

## 2018-01-02 NOTE — Progress Notes (Signed)
Patient ID: Carrie Shaw, female   DOB: 11/23/1985, 32 y.o.   MRN: [id number]   Progress Note: Metabolic and Bariatric Surgery Service   Chief Complaint/Subjective: Having a fair amount gas pain. Did well with water and ice. Had a little nausea with first protein shake Ambulated HTN   Objective: Vital signs in last 24 hours: Temp:  [97.8 F (36.6 C)-99.7 F (37.6 C)] 98.7 F (37.1 C) (04/03 0500) Pulse Rate:  [77-95] 77 (04/03 0500) Resp:  [11-28] 13 (04/03 0500) BP: (125-171)/(76-121) 156/102 (04/03 0500) SpO2:  [92 %-100 %] 99 % (04/03 0500) FiO2 (%):  [28 %] 28 % (04/02 1250) Weight:  [177.1 kg (390 lb 7 oz)] 177.1 kg (390 lb 7 oz) (04/03 0500) Last BM Date: 01/01/18  Intake/Output from previous day: 04/02 0701 - 04/03 0700 In: 3455.8 [P.O.:540; I.V.:2865.8; IV Piggyback:50] Out: 1950 [Urine:1900; Blood:50] Intake/Output this shift: No intake/output data recorded.  Lungs: cta, symm  Cardiovascular: reg  Abd: soft incisions c/d/i; nd; approp mild TTP  Extremities: no edema, +SCDs  Neuro: nonfocal, resting comfortably  Lab Results: CBC  Recent Labs    01/01/18 1129 01/02/18 0510  WBC  --  14.9*  HGB 13.6 12.0  HCT 41.4 36.1  PLT  --  340   BMET Recent Labs    01/02/18 0510  NA 137  K 4.0  CL 105  CO2 24  GLUCOSE 114*  BUN 9  CREATININE 0.64  CALCIUM 8.8*   PT/INR No results for input(s): LABPROT, INR in the last 72 hours. ABG No results for input(s): PHART, HCO3 in the last 72 hours.  Invalid input(s): PCO2, PO2  Studies/Results:  Anti-infectives: Anti-infectives (From admission, onward)   Start     Dose/Rate Route Frequency Ordered Stop   01/01/18 0601  cefoTEtan in Dextrose 5% (CEFOTAN) IVPB 2 g     2 g Intravenous On call to O.R. 01/01/18 0601 01/01/18 0804      Medications: Scheduled Meds: . acetaminophen (TYLENOL) oral liquid 160 mg/5 mL  650 mg Oral Q6H  . amLODipine  5 mg Oral Daily  . enoxaparin (LOVENOX)  injection  30 mg Subcutaneous Q12H  . hydrochlorothiazide  25 mg Oral Daily  . pantoprazole (PROTONIX) IV  40 mg Intravenous QHS  . protein supplement shake  2 oz Oral Q2H  . scopolamine  1 patch Transdermal On Call to OR   Continuous Infusions: . dextrose 5 % and 0.45 % NaCl with KCl 20 mEq/L 1,000 mL (01/02/18 0226)   PRN Meds:.diphenhydrAMINE, enalaprilat, morphine injection, ondansetron (ZOFRAN) IV, oxyCODONE, promethazine, simethicone  Assessment/Plan: Patient Active Problem List   Diagnosis Date Noted  . Essential hypertension 08/29/2017  . Intraoperative bladder injury 08/17/2017  . Pelvic peritoneal adhesions, female 08/17/2017  . Ectopic pregnancy without intrauterine pregnancy 08/16/2017  . Morbid obesity (HCC) 04/23/2013   s/p Procedure(s): LAPAROSCOPIC ROUX-EN-Y GASTRIC BYPASS WITH UPPER ENDOSCOPY 01/01/2018  No fever. No tachycardia Ok to cont diet as tolerated Believe bump in wbc is from decadron.  Cont chemical vte prophylaxis Elevated transaminase prob from liver retraction Ambulate Explained gas pain will get better HTN - restart home BP meds  Disposition:  LOS: 1 day  The patient will be in the hospital for normal postop protocol  Gaynelle AduEric Mulan Adan, MD [phone] [facility]

## 2018-01-02 NOTE — Progress Notes (Signed)
Patient unable to tolerate Premiere Protein without nausea and discomfort.  Tried on thinner protein (Chicken Soup).  Able to tolerate better, but slow to drink.  Discussed with bedside RN.  Patient without questions at this time.

## 2018-01-02 NOTE — Plan of Care (Signed)
Nutrition Education Note  Received consult for diet education per DROP protocol.   Discussed 2 week post op diet with pt. Emphasized that liquids must be non carbonated, non caffeinated, and sugar free. Fluid goals discussed. Pt to follow up with outpatient bariatric RD for further diet progression after 2 weeks. Multivitamins and minerals also reviewed. Teach back method used, pt expressed understanding, expect good compliance.   Diet: First 2 Weeks  You will see the nutritionist about two (2) weeks after your surgery. The nutritionist will increase the types of foods you can eat if you are handling liquids well:  If you have severe vomiting or nausea and cannot handle clear liquids lasting longer than 1 day, call your surgeon  Protein Shake  Drink at least 2 ounces of shake 5-6 times per day  Each serving of protein shakes (usually 8 - 12 ounces) should have a minimum of:  15 grams of protein  And no more than 5 grams of carbohydrate  Goal for protein each day:  Men = 80 grams per day  Women = 60 grams per day  Protein powder may be added to fluids such as non-fat milk or Lactaid milk or Soy milk (limit to 35 grams added protein powder per serving)   Hydration  Slowly increase the amount of water and other clear liquids as tolerated (See Acceptable Fluids)  Slowly increase the amount of protein shake as tolerated  Sip fluids slowly and throughout the day  May use sugar substitutes in small amounts (no more than 6 - 8 packets per day; i.e. Splenda)   Fluid Goal  The first goal is to drink at least 8 ounces of protein shake/drink per day (or as directed by the nutritionist); some examples of protein shakes are Premier Protein, Syntrax Nectar, Adkins Advantage, EAS Edge HP, and Unjury. See handout from pre-op Bariatric Education Class:  Slowly increase the amount of protein shake you drink as tolerated  You may find it easier to slowly sip shakes throughout the day  It is important to  get your proteins in first  Your fluid goal is to drink 64 - 100 ounces of fluid daily  It may take a few weeks to build up to this  32 oz (or more) should be clear liquids  And  32 oz (or more) should be full liquids (see below for examples)  Liquids should not contain sugar, caffeine, or carbonation   Clear Liquids:  Water or Sugar-free flavored water (i.e. Fruit H2O, Propel)  Decaffeinated coffee or tea (sugar-free)  Crystal Lite, Wyler?s Lite, Minute Maid Lite  Sugar-free Jell-O  Bouillon or broth  Sugar-free Popsicle: *Less than 20 calories each; Limit 1 per day   Full Liquids:  Protein Shakes/Drinks + 2 choices per day of other full liquids  Full liquids must be:  No More Than 12 grams of Carbs per serving  No More Than 3 grams of Fat per serving  Strained low-fat cream soup  Non-Fat milk  Fat-free Lactaid Milk  Sugar-free yogurt (Dannon Lite & Fit, Greek yogurt, Oikos Zero)   Valon Glasscock RD, LDN Clinical Nutrition Pager # - [phone]   

## 2018-01-02 NOTE — Progress Notes (Signed)
Patient alert and oriented, Post op day 1.  Provided support and encouragement.  Encouraged pulmonary toilet, ambulation and small sips of liquids.  All questions answered.  Will continue to monitor. 

## 2018-01-03 LAB — CBC WITH DIFFERENTIAL/PLATELET
BASOS PCT: 0 %
Basophils Absolute: 0 10*3/uL (ref 0.0–0.1)
EOS ABS: 0 10*3/uL (ref 0.0–0.7)
EOS PCT: 0 %
HCT: 37.4 % (ref 36.0–46.0)
Hemoglobin: 12 g/dL (ref 12.0–15.0)
Lymphocytes Relative: 23 %
Lymphs Abs: 2.5 10*3/uL (ref 0.7–4.0)
MCH: 28.2 pg (ref 26.0–34.0)
MCHC: 32.1 g/dL (ref 30.0–36.0)
MCV: 88 fL (ref 78.0–100.0)
MONO ABS: 1 10*3/uL (ref 0.1–1.0)
MONOS PCT: 9 %
Neutro Abs: 7.5 10*3/uL (ref 1.7–7.7)
Neutrophils Relative %: 68 %
Platelets: 308 10*3/uL (ref 150–400)
RBC: 4.25 MIL/uL (ref 3.87–5.11)
RDW: 16.5 % — AB (ref 11.5–15.5)
WBC: 11 10*3/uL — ABNORMAL HIGH (ref 4.0–10.5)

## 2018-01-03 MED ORDER — NORGESTIMATE-ETH ESTRADIOL 0.25-35 MG-MCG PO TABS: 1.0000 | ORAL_TABLET | Freq: Every day | ORAL | 11 refills | Status: DC

## 2018-01-03 MED ORDER — OXYCODONE HCL 5 MG/5ML PO SOLN: 5.0000 mg | ORAL | 0 refills | Status: DC | PRN

## 2018-01-03 NOTE — Progress Notes (Signed)
Carrie Corneaawn Williams-James, RN completed bariatric teaching. Assessment unchanged. Pt verbalized understanding of dc instructions through teach back including follow up care and when to call the doctor. No scripts. Discharged via foot per pt request to front entrance accompanied by NT and mother.

## 2018-01-03 NOTE — Progress Notes (Signed)
Patient alert and oriented, pain is controlled. Patient is tolerating fluids, advanced to protein shake today, patient is tolerating well. Reviewed Gastric Bypass discharge instructions with patient and patient is able to articulate understanding. Provided information on BELT program, Support Group and [facility] outpatient pharmacy. All questions answered, will continue to monitor.    

## 2018-01-07 ENCOUNTER — Telehealth (HOSPITAL_COMMUNITY): Payer: Self-pay

## 2018-01-07 NOTE — Telephone Encounter (Signed)
Patient called to discuss post bariatric surgery follow up questions.  See below:   1.  Tell me about your pain and pain management?has not experienced any pain requiring pain medcation  2.  Let's talk about fluid intake.  How much total fluid are you taking in?48-50 ounces of fluid daily  3.  How much protein have you taken in the last 2 days?50 grams of protein   4.  Have you had nausea?  Tell me about when have experienced nausea and what you did to help?no nauseac concerned eating too much food when not hungry.  We discussed head hunger versus hunger.    5.  Has the frequency or color changed with your urine?yes is dark at times, we discussed dehydration symptoms including decreased urine output, headaches, lack of saliva.  Patient aware to call if fluid intake drops  6.  Tell me what your incisions look like?look good steri strips beginning to come off  7.  Have you been passing gas? BM?had bm, concerned about dumping syndrome.  We had further education about early and late dumping  8.  If a problem or question were to arise who would you call?  Do you know contact numbers for [facility], [facility], and [facility]?patient aware how to contact all services  9.  How has the walking going?walking every hour, [place].    10.  How are your vitamins and calcium going?  How are you taking them?taking [facility].  No fond of the taste states it's "nasty".  Calcium 3 times per day without issue

## 2018-01-08 ENCOUNTER — Other Ambulatory Visit: Payer: Self-pay | Admitting: *Deleted

## 2018-01-08 NOTE — Patient Outreach (Addendum)
[facility]) Care Management  01/08/2018  Carrie Shaw Carrie Shaw 07/27/1986 [id number]   Subjective: Telephone call to patient's home /mobile number, spoke with patient, and HIPAA verified.  Discussed [facility] Care Management [employer] Transition of care follow up, patient voiced understanding, and is in agreement to follow up.   Patient states she is doing well, tolerating liquid diet without difficult, and has a follow up appointment with surgeon on 01/25/18.   States she is in the process of change her primary MD to a MD that she has seen in the past and will call to set up follow up appointment.   Patient states she is able to manage self care and has assistance as needed with activities of daily living/ home management.  Patient voices understanding of medical diagnosis, surgery, and treatment plan.  States she is accessing her [employer]ann Auerbach[employer]igna benefits as needed via member services number on back of card or through [email or web address].  Patient states she does not have any education material, transition of care, care coordination, disease management, disease monitoring, transportation, community resource, or pharmacy needs at this time.  States she is very appreciative of the follow up and is in agreement to receive [facility] Care Management information.       Objective: Per KPN (Knowledge Performance Now, point of care tool), [employer] iCollaborate, and chart review, patient hospitalized  01/01/18-  01/03/18 for morbid obesity.  Status post Laparoscopic Roux-en-Y gastric bypass (ante-colic, ante-gastric); and upper endoscopy on 01/01/18.    Patient also has a history of hypertension, chronic bilateral knee pain, and mild OSA.       Assessment: Received [employer] Transition of care referral on 01/07/18.   Transition of care follow up completed, no care management needs, and will proceed with case closure.      Plan: RNCM will send patient successful outreach letter, [facility] pamph[facility], and magnet. RNCM will complete case  closure due to follow up completed / no care management needs.      Hermen Mario H. Gardiner Barefootooper RN, BSN, CCM [facility] Care Management [facility] Telephonic CM Phone: [phone] Fax: [phone]

## 2018-01-08 NOTE — Discharge Summary (Signed)
Physician Discharge Summary  Carrie Shaw ZOX:[id number] DOB: 05-May-1986 DOA: 01/01/2018  PCP: Shirline Frees, NP  Admit date: 01/01/2018 Discharge date: 01/03/2018  Recommendations for Outpatient Follow-up:    Follow-up Information    Gaynelle Adu, MD. Go on 01/25/2018.   Specialty:  General Surgery Why:  at 145.  Please arrive 15 minutes early for appointment. Contact information: [address] [place] [address] [phone]        Gaynelle Adu, MD .   Specialty:  General Surgery Contact information: [address] [place] [address] [phone]          Discharge Diagnoses:  Active Problems:   Morbid obesity (HCC) HTN Bilateral chronic knee pain Mild OSA  Surgical Procedure: Laparoscopic Roux-en-Y gastric bypass, upper endoscopy  Discharge Condition: Good Disposition: Home  Diet recommendation: Postoperative gastric bypass diet  Filed Weights   01/01/18 0613 01/02/18 0500 01/03/18 0500  Weight: (!) 172.9 kg (381 lb 4 oz) (!) 177.1 kg (390 lb 7 oz) (!) 175 kg (385 lb 12.9 oz)     Hospital Course:  The patient was admitted for a planned laparoscopic Roux-en-Y gastric bypass. Please see operative note. Preoperatively the patient was given 5000 units of subcutaneous heparin for DVT prophylaxis. ERAS protocol was used. Postoperative prophylactic Lovenox dosing was started on the evening of postoperative day 0.  The patient was started on ice chips and water on the evening of POD 0 which they tolerated. On postoperative day 1 The patient's diet was advanced to protein shakes which they also tolerated. On POD 2, The patient was ambulating without difficulty. Their vital signs are stable without fever or tachycardia. Their hemoglobin had remained stable.  The patient had received discharge instructions and counseling. They were deemed stable for discharge.  BP (!) 141/99 (BP Location: Right Arm)   Pulse 75   Temp 97.7 F (36.5 C)  (Oral)   Resp 18   Ht 5\' 11"  (1.803 m)   Wt (!) 175 kg (385 lb 12.9 oz)   SpO2 98%   BMI 53.81 kg/m   Gen: alert, NAD, non-toxic appearing Pupils: equal, no scleral icterus Pulm: Lungs clear to auscultation, symmetric chest rise CV: regular rate and rhythm Abd: soft, min tender, nondistended. No cellulitis. No incisional hernia Ext: no edema, no calf tenderness Skin: no rash, no jaundice  Discharge Instructions  Discharge Instructions    Ambulate hourly while awake   Complete by:  As directed    Call MD for:  difficulty breathing, headache or visual disturbances   Complete by:  As directed    Call MD for:  persistant dizziness or light-headedness   Complete by:  As directed    Call MD for:  persistant nausea and vomiting   Complete by:  As directed    Call MD for:  redness, tenderness, or signs of infection (pain, swelling, redness, odor or green/yellow discharge around incision site)   Complete by:  As directed    Call MD for:  severe uncontrolled pain   Complete by:  As directed    Call MD for:  temperature >101 F   Complete by:  As directed    Diet bariatric full liquid   Complete by:  As directed    Discharge instructions   Complete by:  As directed    See bariatric discharge instructions   Incentive spirometry   Complete by:  As directed    Perform hourly while awake  Allergies as of 01/03/2018   No Known Allergies     Medication List    TAKE these medications   amLODipine 5 MG tablet Commonly known as:  NORVASC Take 1 tablet (5 mg total) by mouth daily. Notes to patient:  Monitor Blood Pressure Daily and keep a log for primary care physician.  You may need to make changes to your medications with rapid weight loss.     hydrochlorothiazide 25 MG tablet Commonly known as:  HYDRODIURIL Take 1 tablet (25 mg total) by mouth daily. Notes to patient:  Monitor Blood Pressure Daily and keep a log for primary care physician.  Monitor for symptoms of dehydration.   You may need to make changes to your medications with rapid weight loss.     norgestimate-ethinyl estradiol 0.25-35 MG-MCG tablet Commonly known as:  ORTHO-CYCLEN,SPRINTEC,PREVIFEM Take 1 tablet by mouth daily. Start taking on:  01/30/2018 What changed:  These instructions start on 01/30/2018. If you are unsure what to do until then, ask your doctor or other care provider.   oxyCODONE 5 MG/5ML solution Commonly known as:  ROXICODONE Take 5 mLs (5 mg total) by mouth every 4 (four) hours as needed for moderate pain or severe pain.      Follow-up Information    Gaynelle AduWilson, Jerrold Haskell, MD. Go on 01/25/2018.   Specialty:  General Surgery Why:  at 145.  Please arrive 15 minutes early for appointment. Contact information: [address] [place] [address] [phone]        Gaynelle AduWilson, Louis Gaw, MD .   Specialty:  General Surgery Contact information: [address] [place] [address] [phone]            The results of significant diagnostics from this hospitalization (including imaging, microbiology, ancillary and laboratory) are listed below for reference.    Significant Diagnostic Studies: No results found.  Labs: Basic Metabolic Panel: Recent Labs  Lab 01/02/18 0510  NA 137  K 4.0  CL 105  CO2 24  GLUCOSE 114*  BUN 9  CREATININE 0.64  CALCIUM 8.8*   Liver Function Tests: Recent Labs  Lab 01/02/18 0510  AST 202*  ALT 212*  ALKPHOS 47  BILITOT 0.6  PROT 7.5  ALBUMIN 3.2*    CBC: Recent Labs  Lab 01/02/18 0510 01/03/18 0454  WBC 14.9* 11.0*  NEUTROABS 11.7* 7.5  HGB 12.0 12.0  HCT 36.1 37.4  MCV 86.4 88.0  PLT 340 308    CBG: No results for input(s): GLUCAP in the last 168 hours.  Active Problems:   Morbid obesity (HCC)   Time coordinating discharge: 15 min  Signed:  Atilano InaEric M Katja Blue, MD [facility], [place] [phone] 01/08/2018, 11:33 AM

## 2018-01-09 ENCOUNTER — Other Ambulatory Visit: Payer: Self-pay | Admitting: Obstetrics & Gynecology

## 2018-01-09 MED ORDER — AMLODIPINE BESYLATE 5 MG PO TABS: 5.0000 mg | ORAL_TABLET | Freq: Every day | ORAL | 3 refills | Status: DC

## 2018-01-15 ENCOUNTER — Encounter: Payer: Managed Care, Other (non HMO) | Attending: General Surgery | Admitting: Registered"

## 2018-01-15 DIAGNOSIS — E669 Obesity, unspecified: Secondary | ICD-10-CM

## 2018-01-15 DIAGNOSIS — Z713 Dietary counseling and surveillance: Secondary | ICD-10-CM | POA: Insufficient documentation

## 2018-01-15 NOTE — Progress Notes (Signed)
Bariatric Class:  Appt start time: 1530 end time:  1630.  2 Week Post-Operative Nutrition Class  Patient was seen on 01/15/2018 for Post[facility]ative [facility]   Surgery date: 01/01/2018 Surgery type: RYGB Start weight at [facility]: 379.2 Weight today: 360.4 Weight change: 18.8  Pt states she is averaging about 45-55 ounces of fluid and 45-55 grams of protein a day. Pt states her mood is happier and she sometimes feels dizziness or lightheaded. Pt states she has some constipation.   TANITA  BODY COMP RESULTS  01/15/2018   BMI (kg/m^2) 50.3   Fat Mass (lbs) 208.6   Fat Free Mass (lbs) 151.8   Total Body Water (lbs) N/A   The following the learning objectives were met by the patient during this course:  Identifies Phase 3A (Soft, High Proteins) Dietary Goals and will begin from 2 weeks post-operatively to 2 months post-operatively  Identifies appropriate sources of fluids and proteins   States protein recommendations and appropriate sources post-operatively  Identifies the need for appropriate texture modifications, mastication, and bite sizes when consuming solids  Identifies appropriate multivitamin and calcium sources post-operatively  Describes the need for physical activity post-operatively and will follow MD recommendations  States when to call healthcare provider regarding medication questions or post-operative complications  Handouts given during class include:  Phase 3A: Soft, High Protein Diet Handout  Follow-Up Plan: Patient will follow-up at [facility] in 6 weeks for 2 month post-op nutrition visit for diet advancement per MD.

## 2018-01-23 ENCOUNTER — Telehealth: Payer: Self-pay | Admitting: Registered"

## 2018-01-23 NOTE — Telephone Encounter (Signed)
RD called pt to verify fluid intake once restarting soft, solid proteins 2 week post-bariatric surgery. Pt was unavailable and RD left voicemail.

## 2018-02-06 ENCOUNTER — Ambulatory Visit: Payer: Self-pay | Admitting: Neurology

## 2018-02-26 ENCOUNTER — Encounter: Payer: Managed Care, Other (non HMO) | Attending: General Surgery | Admitting: Skilled Nursing Facility1

## 2018-02-26 ENCOUNTER — Encounter: Payer: Self-pay | Admitting: Skilled Nursing Facility1

## 2018-02-26 DIAGNOSIS — Z713 Dietary counseling and surveillance: Secondary | ICD-10-CM | POA: Diagnosis not present

## 2018-02-26 NOTE — Progress Notes (Signed)
Follow-up visit:  8 Weeks Post-Operative RYGB Surgery  Primary concerns today: Post-operative Bariatric Surgery Nutrition Management.  Pt states meat is too heavy: ground beef is okay but still heavy, [place], chicken, pork, beef. Pt states she has been vomiting when eating too much. Pt states she has been eating fruit. Pt states she also feels salmon was too heavy. Pt states she goes all day without eating. Pt states she wants to start BELT. Pt states she should have called sooner but did not, not giving a reason as to why she did not call.   Surgery date: 01/01/2018 Surgery type: RYGB Start weight at [facility]: 379.2 Weight today: 360.4 Weight change:   TANITA  BODY COMP RESULTS  01/15/2018 02/26/2018   BMI (kg/m^2) 50.3 47.8   Fat Mass (lbs) 208.6 193.4   Fat Free Mass (lbs) 151.8 149.6   Total Body Water (lbs) N/A 113.4   24-hr recall: B (AM): cottage cheese Snk (AM):  L (PM): protein shake Snk (PM):  D (PM): protein shake Snk (PM):   Fluid intake: 64+16 ounces of water, decaff coffee, green tea  Estimated total protein intake: 60+  Medications: See List  Supplementation: procare health and viactive calcium   Using straws: no Drinking while eating: no Having you been chewing well:no Chewing/swallowing difficulties: no Changes in vision: no Changes to mood/headaches: no Hair loss/Cahnges to skin/Changes to nails: no Any difficulty focusing or concentrating: no Sweating: no Dizziness/Lightheaded: no Palpitations: no  Carbonated beverages: no N/V/D/C/GAS: some vomiting, having a bowel movement every 2-3 days Abdominal Pain: no Dumping syndrome: no  Recent physical activity:  Some walking   Progress Towards Goal(s):  In progress.   Nutritional Diagnosis:  [place]-3.3 Overweight/obesity related to past poor dietary habits and physical inactivity as evidenced by patient w/ recent RYGB surgery following dietary guidelines for continued weight loss.  Intervention:  Nutrition  counseling. Dietitian educated the pt on a vegetarian diet.  Goals: -Use fruit as snack or with breakfast sticking with 1/4-1/2 cup or one small piece the size of your palm -Aim for plant based proteins  -Sit up straight while eating  -Call [facility] if you continue to struggle with foods Teaching Method Utilized:  Visual Auditory Hands on  Barriers to learning/adherence to lifestyle change: trouble toelrating animal products  Demonstrated degree of understanding via:  Teach Back   Monitoring/Evaluation:  Dietary intake, exercise, and body weight.

## 2018-03-06 ENCOUNTER — Ambulatory Visit: Payer: Managed Care, Other (non HMO) | Admitting: Obstetrics & Gynecology

## 2018-04-10 ENCOUNTER — Encounter: Payer: Self-pay | Admitting: Skilled Nursing Facility1

## 2018-04-10 ENCOUNTER — Encounter: Payer: Managed Care, Other (non HMO) | Attending: General Surgery | Admitting: Skilled Nursing Facility1

## 2018-04-10 DIAGNOSIS — Z713 Dietary counseling and surveillance: Secondary | ICD-10-CM | POA: Insufficient documentation

## 2018-04-10 NOTE — Patient Instructions (Addendum)
-  Start taking the pro-care capsule   Protein sources:   -Ground [place]key  -Egg  -Cheese  -Beans  -Lentils  -Soy crumbles  -Tofu  -Frozen vegetarian options like burgers and chicken etc.  -Sardines   -Anchovies?  -Oatmeal   -Soy milk (sweetened is fine)  -1% OR fat free cows milk  -Protein shakes (not preferred)  -Each meal: protein, vegetable, energy   -Aim for 64 fluid ounces every day  -Take your multivitamin with food (capsule from pro-care

## 2018-04-10 NOTE — Progress Notes (Signed)
Follow-up visit:  8 Weeks Post-Operative RYGB Surgery  Primary concerns today: Post-operative Bariatric Surgery Nutrition Management.  Pt states [place] and went cliff jumping and parasailing. Pt states she has drank some diet soda but the bubbles fill her up too much. Pt states she has had wine which has made her drunk after half a cup. Pt states she has tried natures twist but did not like it. Pt states she sleeps a lot better now. Pt states she has had more energy. Pt states she has tried the tofu and soy crumbles which she liked. Pt states she likes baked potatoes. Pt states she does not eat beef, pork, and seafood due to uncomfortable and does not taste good. Pt states she wants to start using weights.    Surgery date: 01/01/2018 Surgery type: RYGB [facility] 379.2 Weight today: 327.4 Weight change: 33  TANITA  BODY COMP RESULTS  01/15/2018 02/26/2018 04/10/2018   BMI (kg/m^2) 50.3 47.8 45.7   Fat Mass (lbs) 208.6 193.4 1804   Fat Free Mass (lbs) 151.8 149.6 147   Total Body Water (lbs) N/A 113.4 111   24-hr recall: B (AM): core power Snk (AM):  L (PM): protein shake or chili from wendys Snk (PM):  D (PM): protein shake and scrambled egg Snk (PM): yogurt  Fluid intake: 40-60 ounces   Estimated total protein intake: 60+  Medications: See List  Supplementation: procare health and viactive calcium   Using straws: no Drinking while eating: no Having you been chewing well:no Chewing/swallowing difficulties: no Changes in vision: no Changes to mood/headaches: no Hair loss/Cahnges to skin/Changes to nails: no Any difficulty focusing or concentrating: no Sweating: no Dizziness/Lightheaded: no Palpitations: no  Carbonated beverages: no N/V/D/C/GAS: having a bowel movement daily  Abdominal Pain: no Dumping syndrome: no  Recent physical activity:  Walking 5 days a week 45 minutes-60 minutes   Progress Towards Goal(s):  In  progress.   Nutritional Diagnosis:  [place]-3.3 Overweight/obesity related to past poor dietary habits and physical inactivity as evidenced by patient w/ recent RYGB surgery following dietary guidelines for continued weight loss.  Intervention:  Nutrition counseling. Dietitian educated the pt on a vegetarian diet.  Goals: -Start taking the pro-care capsule   Protein sources:   -Ground [place]key  -Egg  -Cheese  -Beans  -Lentils  -Soy crumbles  -Tofu  -Frozen vegetarian options like burgers and chicken etc.  -Sardines   -Anchovies?  -Oatmeal   -Soy milk (sweetened is fine)  -1% OR fat free cows milk  -Protein shakes (not preferred)  -Each meal: protein, vegetable, energy   -Aim for 64 fluid ounces every day  -Take your multivitamin with food (capsule from pro-care Teaching Method Utilized:  Visual Auditory Hands on  Barriers to learning/adherence to lifestyle change: trouble toelrating animal products  Demonstrated degree of understanding via:  Teach Back   Monitoring/Evaluation:  Dietary intake, exercise, and body weight.

## 2018-05-08 ENCOUNTER — Telehealth: Payer: Self-pay | Admitting: Skilled Nursing Facility1

## 2018-05-08 NOTE — Telephone Encounter (Signed)
Pt called.  Dietitian returned LVM

## 2018-06-04 ENCOUNTER — Other Ambulatory Visit: Payer: Self-pay

## 2018-06-04 ENCOUNTER — Encounter (HOSPITAL_COMMUNITY): Payer: Self-pay

## 2018-06-04 ENCOUNTER — Ambulatory Visit (HOSPITAL_COMMUNITY)
Admission: EM | Admit: 2018-06-04 | Discharge: 2018-06-04 | Disposition: A | Discharge status: Home / Self Care | Payer: Managed Care, Other (non HMO) | Attending: Family Medicine | Admitting: Family Medicine

## 2018-06-04 DIAGNOSIS — Z87891 Personal history of nicotine dependence: Secondary | ICD-10-CM | POA: Insufficient documentation

## 2018-06-04 DIAGNOSIS — Z79899 Other long term (current) drug therapy: Secondary | ICD-10-CM | POA: Diagnosis not present

## 2018-06-04 DIAGNOSIS — N760 Acute vaginitis: Secondary | ICD-10-CM | POA: Diagnosis not present

## 2018-06-04 DIAGNOSIS — Z3202 Encounter for pregnancy test, result negative: Secondary | ICD-10-CM | POA: Diagnosis not present

## 2018-06-04 DIAGNOSIS — Z113 Encounter for screening for infections with a predominantly sexual mode of transmission: Secondary | ICD-10-CM

## 2018-06-04 DIAGNOSIS — N898 Other specified noninflammatory disorders of vagina: Secondary | ICD-10-CM

## 2018-06-04 LAB — POCT URINALYSIS DIP (DEVICE)
GLUCOSE, UA: NEGATIVE mg/dL
Hgb urine dipstick: NEGATIVE
Ketones, ur: NEGATIVE mg/dL
Leukocytes, UA: NEGATIVE
NITRITE: NEGATIVE
PH: 5.5 (ref 5.0–8.0)
PROTEIN: 100 mg/dL — AB
Specific Gravity, Urine: >=1.03 (ref 1.005–1.030)
UROBILINOGEN UA: 1 mg/dL (ref 0.0–1.0)

## 2018-06-04 LAB — POCT PREGNANCY, URINE: PREG TEST UR: NEGATIVE

## 2018-06-04 MED ORDER — FLUCONAZOLE 150 MG PO TABS: 150.0000 mg | ORAL_TABLET | Freq: Once | ORAL | 0 refills | Status: AC

## 2018-06-04 MED ORDER — METRONIDAZOLE 500 MG PO TABS: 500.0000 mg | ORAL_TABLET | Freq: Two times a day (BID) | ORAL | 0 refills | Status: AC

## 2018-06-04 NOTE — ED Provider Notes (Signed)
[facility]    CSN: [id number] Arrival date & time: 06/04/18  0803     History   Chief Complaint Chief Complaint  Patient presents with  . SEXUALLY TRANSMITTED DISEASE    HPI Carrie Shaw is a 32 y.o. female history of hypertension, previous ectopic pregnancy presenting today for evaluation of STD screening.  Patient states that she caught her boyfriend cheating on her and would like to be screened for STDs.  She has noted a vaginal odor.  States that she has a history of BV and yeast.  Denies any vaginal discharge abnormal from her normal.  Last menstrual period was approximately 2 weeks ago, not on any form of birth control.  Denies any fever, nausea, vomiting, abdominal pain.  Denies any urinary symptoms of dysuria, increased frequency or urgency.  Denies back pain.  HPI  Past Medical History:  Diagnosis Date  . HSV infection   . Hypertension   . Sleep apnea    mild no mask  . SVD (spontaneous vaginal delivery)    x 2  . Termination of pregnancy (fetus)    x 2  . UTI (urinary tract infection)     Patient Active Problem List   Diagnosis Date Noted  . Essential hypertension 08/29/2017  . Intraoperative bladder injury 08/17/2017  . Pelvic peritoneal adhesions, female 08/17/2017  . Ectopic pregnancy without intrauterine pregnancy 08/16/2017  . Morbid obesity (HCC) 04/23/2013    Past Surgical History:  Procedure Laterality Date  . APPENDECTOMY    . CYSTOSCOPY N/A 08/16/2017   Procedure: CYSTOSCOPY;  Surgeon: Conan Bowens, MD;  Location: [facility];  Service: Gynecology;  Laterality: N/A;  . GASTRIC ROUX-EN-Y N/A 01/01/2018   Procedure: LAPAROSCOPIC ROUX-EN-Y GASTRIC BYPASS WITH UPPER ENDOSCOPY;  Surgeon: Gaynelle Adu, MD;  Location: [facility];  Service: General;  Laterality: N/A;  . LAPAROSCOPY N/A 08/16/2017   Procedure: LAPAROSCOPY DIAGNOSTIC;  Surgeon: Conan Bowens, MD;  Location: [facility];  Service: Gynecology;  Laterality: N/A;  . LAPAROTOMY N/A  08/16/2017   Procedure: EXPLORATORY LAPAROTOMY WITH REMOVAL OF LEFT ECTOPIC PREGNANCY, LEFT SALPINGECTOMY AND CYSTOTOMY OF BLADDER;  Surgeon: Conan Bowens, MD;  Location: [facility];  Service: Gynecology;  Laterality: N/A;  . LYSIS OF ADHESION N/A 08/16/2017   Procedure: LYSIS OF ADHESION;  Surgeon: Conan Bowens, MD;  Location: [facility];  Service: Gynecology;  Laterality: N/A;  . UNILATERAL SALPINGECTOMY Left 08/16/2017   Procedure: UNILATERAL SALPINGECTOMY;  Surgeon: Conan Bowens, MD;  Location: [facility];  Service: Gynecology;  Laterality: Left;    OB History    Gravida  5   Para  2   Term  2   Preterm      AB  2   Living  2     SAB  0   TAB  2   Ectopic      Multiple      Live Births  2            Home Medications    Prior to Admission medications   Medication Sig Start Date End Date Taking? Authorizing Provider  amLODipine (NORVASC) 5 MG tablet Take 1 tablet (5 mg total) by mouth daily. 01/09/18   Willodean Rosenthal, MD  fluconazole (DIFLUCAN) 150 MG tablet Take 1 tablet (150 mg total) by mouth once for 1 dose. 06/04/18 06/04/18  Lorelie Biermann C, PA-C  hydrochlorothiazide (HYDRODIURIL) 25 MG tablet Take 1 tablet (25 mg total) by mouth daily. 10/31/17  Willodean Rosenthal, MD  metroNIDAZOLE (FLAGYL) 500 MG tablet Take 1 tablet (500 mg total) by mouth 2 (two) times daily for 7 days. 06/04/18 06/11/18  Aubriee Szeto, Junius Creamer, PA-C    Family History Family History  Problem Relation Age of Onset  . Depression Mother   . Hypertension Mother   . Hypertension Father   . Schizophrenia Sister   . Bipolar disorder Sister     Social History Social History   Tobacco Use  . Smoking status: Former Smoker    Packs/day: 0.25    Years: 10.00    Pack years: 2.50    Types: Cigarettes    Last attempt to quit: 11/28/2017    Years since quitting: 0.5  . Smokeless tobacco: Never Used  Substance Use Topics  . Alcohol use: Not Currently    Comment: rare  . Drug use: No      Allergies   Patient has no known allergies.   Review of Systems Review of Systems  Constitutional: Negative for fever.  Respiratory: Negative for shortness of breath.   Cardiovascular: Negative for chest pain.  Gastrointestinal: Negative for abdominal pain, diarrhea, nausea and vomiting.  Genitourinary: Positive for vaginal discharge. Negative for dysuria, flank pain, genital sores, hematuria, menstrual problem, vaginal bleeding and vaginal pain.  Musculoskeletal: Negative for back pain.  Skin: Negative for rash.  Neurological: Negative for dizziness, light-headedness and headaches.     Physical Exam Triage Vital Signs ED Triage Vitals  Enc Vitals Group     BP 06/04/18 0811 (!) 144/82     Pulse Rate 06/04/18 0811 66     Resp 06/04/18 0811 18     Temp 06/04/18 0811 98.4 F (36.9 C)     Temp Source 06/04/18 0811 Oral     SpO2 06/04/18 0811 100 %     Weight 06/04/18 0815 (!) 301 lb (136.5 kg)     Height --      Head Circumference --      Peak Flow --      Pain Score --      Pain Loc --      Pain Edu? --      Excl. in GC? --    No data found.  Updated Vital Signs BP (!) 144/82 (BP Location: Left Arm)   Pulse 66   Temp 98.4 F (36.9 C) (Oral)   Resp 18   Wt (!) 301 lb (136.5 kg)   LMP 05/27/2018   SpO2 100%   BMI 41.98 kg/m   Visual Acuity Right Eye Distance:   Left Eye Distance:   Bilateral Distance:    Right Eye Near:   Left Eye Near:    Bilateral Near:     Physical Exam  Constitutional: She is oriented to person, place, and time. She appears well-developed and well-nourished.  No acute distress  HENT:  Head: Normocephalic and atraumatic.  Nose: Nose normal.  Eyes: Conjunctivae are normal.  Neck: Neck supple.  Cardiovascular: Normal rate.  Pulmonary/Chest: Effort normal. No respiratory distress.  Abdominal: She exhibits no distension.  Genitourinary:  Genitourinary Comments: Deferred-patient denies pain, rashes or lesions  Musculoskeletal:  Normal range of motion.  Neurological: She is alert and oriented to person, place, and time.  Skin: Skin is warm and dry.  Psychiatric: She has a normal mood and affect.  Nursing note and vitals reviewed.    UC Treatments / Results  Labs (all labs ordered are listed, but only abnormal results are displayed) Labs Reviewed  CERVICOVAGINAL ANCILLARY ONLY    EKG None  Radiology No results found.  Procedures Procedures (including critical care time)  Medications Ordered in UC Medications - No data to display  Initial Impression / Assessment and Plan / UC Course  I have reviewed the triage vital signs and the nursing notes.  Pertinent labs & imaging results that were available during my care of the patient were reviewed by me and considered in my medical decision making (see chart for details).     Patient with vaginal odor, will empirically treat for BV, will also provide Diflucan once patient gets yeast infections after taking antibiotic.  Vaginal swab obtained will send off to screen for GC/chlamydia/trichomonas.  Will call patient with results and alter treatment as needed.Discussed strict return precautions. Patient verbalized understanding and is agreeable with plan.  Final Clinical Impressions(s) / UC Diagnoses   Final diagnoses:  Vaginitis and vulvovaginitis  Screen for STD (sexually transmitted disease)     Discharge Instructions     Please begin taking metronidazole twice daily for the next week, please do not drink alcohol until 24 hours after taking the last tablet.  Please also take 1 tablet of Diflucan today, may repeat after completing course of metronidazole.  We are testing you for Gonorrhea, Chlamydia, Trichomonas, Yeast and Bacterial Vaginosis. We will call you if anything is positive and let you know if you require any further treatment. Please inform partners of any positive results.   Please return if symptoms not improving with treatment, development  of fever, nausea, vomiting, abdominal pain.     ED Prescriptions    Medication Sig Dispense Auth. Provider   metroNIDAZOLE (FLAGYL) 500 MG tablet Take 1 tablet (500 mg total) by mouth 2 (two) times daily for 7 days. 14 tablet Bentlee Drier C, PA-C   fluconazole (DIFLUCAN) 150 MG tablet Take 1 tablet (150 mg total) by mouth once for 1 dose. 2 tablet Dinesha Twiggs C, PA-C     Controlled Substance Prescriptions [place] Controlled Substance Registry consulted? Not Applicable   Lew Dawes, [place] 06/04/18 [phone]

## 2018-06-04 NOTE — Discharge Instructions (Signed)
Please begin taking metronidazole twice daily for the next week, please do not drink alcohol until 24 hours after taking the last tablet.  Please also take 1 tablet of Diflucan today, may repeat after completing course of metronidazole.  We are testing you for Gonorrhea, Chlamydia, Trichomonas, Yeast and Bacterial Vaginosis. We will call you if anything is positive and let you know if you require any further treatment. Please inform partners of any positive results.   Please return if symptoms not improving with treatment, development of fever, nausea, vomiting, abdominal pain.

## 2018-06-04 NOTE — ED Triage Notes (Signed)
Pt states she caught her boyfriend cheating. Pt states she wants to be tested for STD's.

## 2018-06-05 LAB — CERVICOVAGINAL ANCILLARY ONLY
Bacterial vaginitis: POSITIVE — AB
CANDIDA VAGINITIS: NEGATIVE
Chlamydia: NEGATIVE
NEISSERIA GONORRHEA: NEGATIVE
Trichomonas: NEGATIVE

## 2018-06-11 ENCOUNTER — Ambulatory Visit: Payer: Managed Care, Other (non HMO) | Admitting: Registered"

## 2018-07-09 ENCOUNTER — Other Ambulatory Visit: Payer: Self-pay | Admitting: General Practice

## 2018-07-09 ENCOUNTER — Encounter: Payer: Managed Care, Other (non HMO) | Attending: General Surgery | Admitting: Skilled Nursing Facility1

## 2018-07-09 DIAGNOSIS — Z713 Dietary counseling and surveillance: Secondary | ICD-10-CM | POA: Insufficient documentation

## 2018-07-09 DIAGNOSIS — T3695XA Adverse effect of unspecified systemic antibiotic, initial encounter: Principal | ICD-10-CM

## 2018-07-09 DIAGNOSIS — B379 Candidiasis, unspecified: Secondary | ICD-10-CM

## 2018-07-09 MED ORDER — TERCONAZOLE 0.4 % VA CREA: 1.0000 | TOPICAL_CREAM | Freq: Every day | VAGINAL | 0 refills | Status: DC

## 2018-07-11 ENCOUNTER — Encounter: Payer: Self-pay | Admitting: Skilled Nursing Facility1

## 2018-07-11 NOTE — Progress Notes (Addendum)
Follow-up visit:  Post-Operative RYGB Surgery  Medical Nutrition Therapy:  Appt start time: 6:00pm end time:  7:00pm  Primary concerns today: Post-operative Bariatric Surgery Nutrition Management 6 Month Post-Op Class  Pt is very concerned with her hunger/appetite increase.   Surgery date: 01/01/2018 Surgery type: RYGB Start weight at [facility]: 379.2 Weight today: 295.4  TANITA  BODY COMP RESULTS  01/15/2018 02/26/2018 04/10/2018 07/11/2018   BMI (kg/m^2) 50.3 47.8 45.7 41.2   Fat Mass (lbs) 208.6 193.4 1804 154.2   Fat Free Mass (lbs) 151.8 149.6 147 141.2   Total Body Water (lbs) N/A 113.4 111 105.6    Information Reviewed/ Discussed During Appointment: -Review of composition scale numbers -Fluid requirements (64-100 ounces) -Protein requirements (60-80g) -Strategies for tolerating diet -Advancement of diet to include Starchy vegetables -Barriers to inclusion of new foods -Inclusion of appropriate multivitamin and calcium supplements  -Exercise recommendations   Fluid intake: goal  Medications: see list Supplementation: taking  Using straws: no Drinking while eating: no Having you been chewing well: yes Chewing/swallowing difficulties: no Changes in vision: no Changes to mood/headaches: no Hair loss/Cahnges to skin/Changes to nails: no Any difficulty focusing or concentrating: no Sweating: no Dizziness/Lightheaded:  Palpitations: n  Carbonated beverages: no N/V/D/C/GAS: no Abdominal Pain: no Dumping syndrome: no  Recent physical activity:  Stair[employer]!  Progress Towards Goal(s):  In progress.  Handouts given during visit include:  Phase V diet Progression   Goals Sheet  The Benefits of Exercise are endless.....  Support Group Topics  Pt Chosen Goals:  I will chew until applesauce consistency every time I eat by 09/01/2018  I will say 2 nice things to and about myself 7 days a week by 09/01/2018  I will take the stairs whenever available by  09/01/2018  Teaching Method Utilized:  Visual Auditory Hands on   Demonstrated degree of understanding via:  Teach Back   Monitoring/Evaluation:  Dietary intake, exercise, and body weight. Follow up in 3 months for 9 month post-op visit.

## 2018-07-16 ENCOUNTER — Telehealth: Payer: Self-pay | Admitting: Skilled Nursing Facility1

## 2018-07-16 NOTE — Telephone Encounter (Signed)
Opened in error

## 2018-08-19 ENCOUNTER — Other Ambulatory Visit (HOSPITAL_COMMUNITY)
Admission: RE | Admit: 2018-08-19 | Discharge: 2018-08-19 | Disposition: A | Discharge status: Home / Self Care | Payer: Managed Care, Other (non HMO) | Source: Ambulatory Visit | Attending: Family Medicine | Admitting: Family Medicine

## 2018-08-19 ENCOUNTER — Other Ambulatory Visit: Payer: Self-pay | Admitting: Family Medicine

## 2018-08-19 DIAGNOSIS — Z0000 Encounter for general adult medical examination without abnormal findings: Secondary | ICD-10-CM | POA: Diagnosis not present

## 2018-08-20 ENCOUNTER — Other Ambulatory Visit: Payer: Self-pay | Admitting: Family Medicine

## 2018-08-20 DIAGNOSIS — N6320 Unspecified lump in the left breast, unspecified quadrant: Secondary | ICD-10-CM

## 2018-08-20 LAB — CYTOLOGY - PAP: Diagnosis: NEGATIVE

## 2018-08-22 ENCOUNTER — Ambulatory Visit
Admission: RE | Admit: 2018-08-22 | Discharge: 2018-08-22 | Disposition: A | Discharge status: Home / Self Care | Payer: Managed Care, Other (non HMO) | Source: Ambulatory Visit | Attending: Family Medicine | Admitting: Family Medicine

## 2018-08-22 DIAGNOSIS — N6320 Unspecified lump in the left breast, unspecified quadrant: Secondary | ICD-10-CM

## 2018-09-09 ENCOUNTER — Other Ambulatory Visit: Payer: Self-pay

## 2018-09-09 NOTE — Telephone Encounter (Signed)
[facility] pharmacy requested refill for Hydrochlorothiazide 25mg  tablets, sent pt a MyChart message that she needs to have her primary care physician to monitor that medication.

## 2018-10-10 ENCOUNTER — Encounter: Payer: Self-pay | Admitting: Skilled Nursing Facility1

## 2018-10-10 ENCOUNTER — Encounter: Payer: Managed Care, Other (non HMO) | Attending: General Surgery | Admitting: Skilled Nursing Facility1

## 2018-10-10 DIAGNOSIS — Z713 Dietary counseling and surveillance: Secondary | ICD-10-CM | POA: Diagnosis not present

## 2018-10-10 NOTE — Progress Notes (Signed)
Follow-up visit:  Post-Operative RYGB Surgery  Medical Nutrition Therapy:  Appt start time: 6:00pm end time:  7:00pm  Primary concerns today: Post-operative Bariatric Surgery Nutrition Management 6 Month Post-Op Class  Pt is very concerned with her hunger/appetite increase.   Pt states she is feeling better now that her stall in weight loss has stopped. Pt state she is now taking a B12 shot every month and vitamin D supplement. Pt states she did start a mental health medication. Pt states her doctor told her she was very low in B12 stating she has been taking [employer] multivitamin daily since surgery. Pt states she has been feeling very tired and confused. Pt states she has no appetite. Pt states she has been low in B12 for about 2 months.  Pt states he does not see any issues with attempting to eat mor throughout the day other than fear of gaining weight.   Vitamin D 15.7  Surgery date: 01/01/2018 Surgery type: RYGB Start weight at [facility]: 379.2 Weight today: 284.8 Weight change: 11  TANITA  BODY COMP RESULTS  04/10/2018 07/11/2018 10/10/2018   BMI (kg/m^2) 45.7 41.2 39.7   Fat Mass (lbs) 1804 154.2 144.2   Fat Free Mass (lbs) 147 141.2 140.6   Total Body Water (lbs) 111 105.6 105   24 hr recall: 3 times a week 1 snack  2 scrambled eggs with salsa (half of it) Salad with ground [place] or steamed cabbage or peppers and onion Black bean ground meat lettuce tomato and onion   Physical activity: Going to get back into   Fluid intake: goal  Medications: see list Supplementation: taking  Using straws: no Drinking while eating: no Having you been chewing well: yes Chewing/swallowing difficulties: no Changes in vision: no Changes to mood/headaches: no Hair loss/Cahnges to skin/Changes to nails: no Any difficulty focusing or concentrating: no Sweating: no Dizziness/Lightheaded:  Palpitations: n  Carbonated beverages: no N/V/D/C/GAS: no Abdominal Pain: no Dumping  syndrome: no  Progress Towards Goal(s):  In progress.  Goals: -Eat every 3 hours: use your meal and snack idea sheet -Try dried edamame in yogurt or soup -For breakfast dried low sugar cereal (9g grams or less per serving) with greek yogurt with 2% milk or oatmeal made with milk and nuts  -Have ground [place] or beef at least 4 days a week  Teaching Method Utilized:  Visual Auditory Hands on  Demonstrated degree of understanding via:  Teach Back   Monitoring/Evaluation:  Dietary intake, exercise, and body weight. Follow up in 1 month

## 2018-10-10 NOTE — Patient Instructions (Addendum)
-  Eat every 3 hours   -Try dried edamame in yogurt or soup  -For breakfast dried low sugar cereal (9g grams or less per serving) with greek yogurt with 2% milk or oatmeal made with milk and nuts   -Have ground [place] or beef at least 4 days a week

## 2019-10-21 IMAGING — US US OB COMP LESS 14 WK
1 series · 14 of 28 positions shown · non-contrast
Comparison: Ultrasound abdomen 09/03/2010

CLINICAL DATA: Pregnant patient with abdominal pain.

EXAM:
OBSTETRIC <14 WK US AND TRANSVAGINAL OB US
TECHNIQUE: Both transabdominal and transvaginal ultrasound examinations were
performed for complete evaluation of the gestation as well as the
maternal uterus, adnexal regions, and pelvic cul-de-sac.
Transvaginal technique was performed to assess early pregnancy.

[Series 1: us ob comp less 14 wk · 14 of 48 slices shown]
[im 2/48]
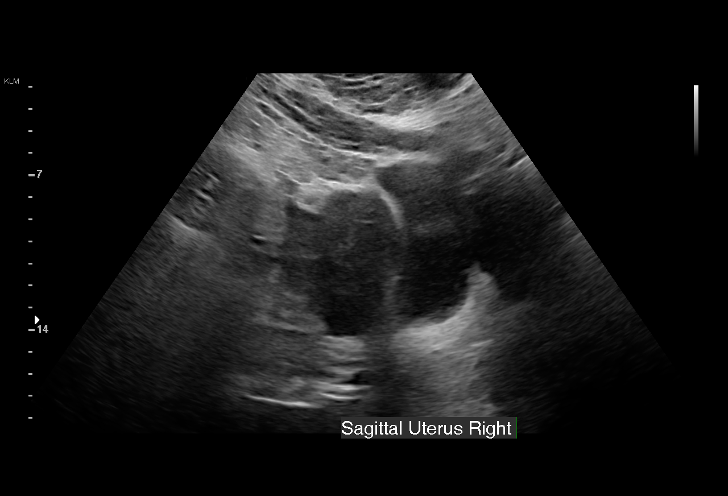
[im 6/48]
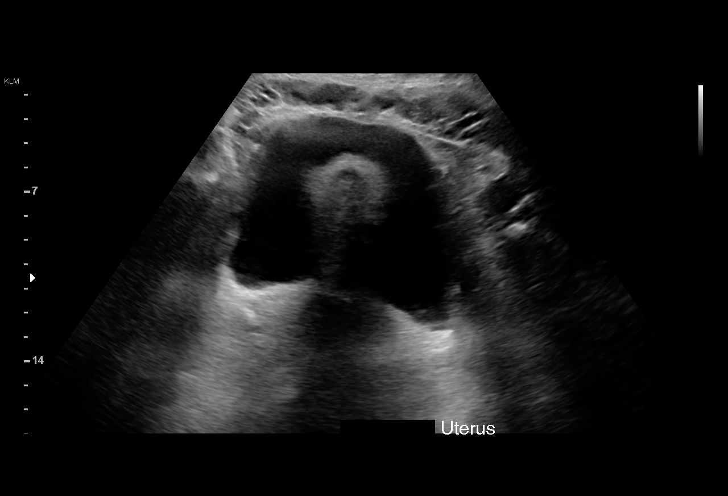
[im 9/48]
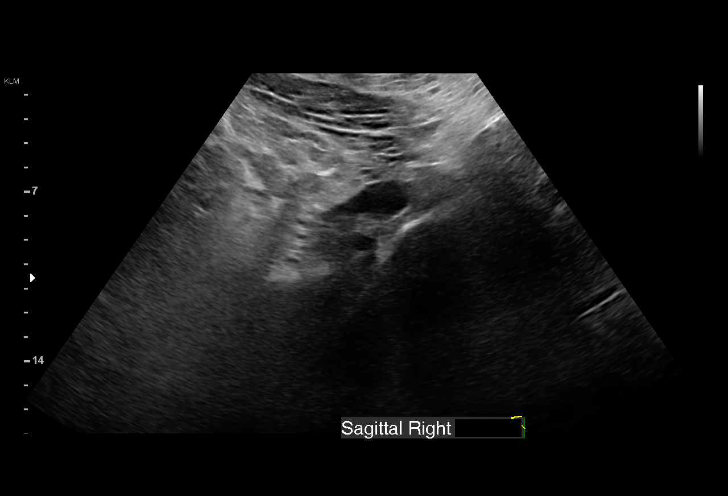
[im 13/48]
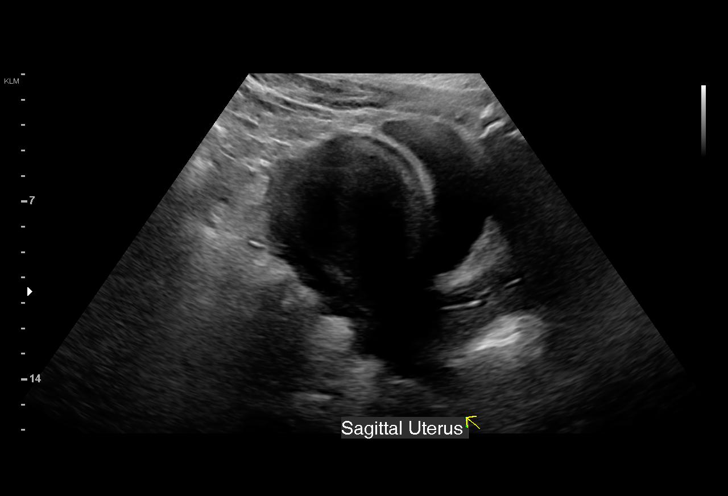
[im 16/48]
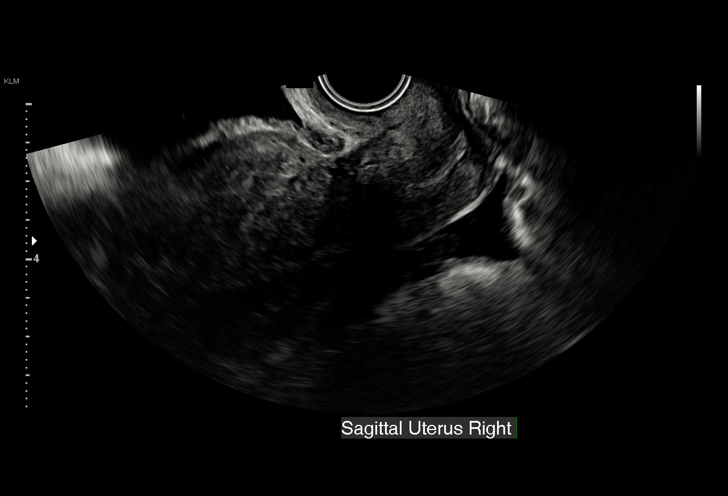
[im 20/48]
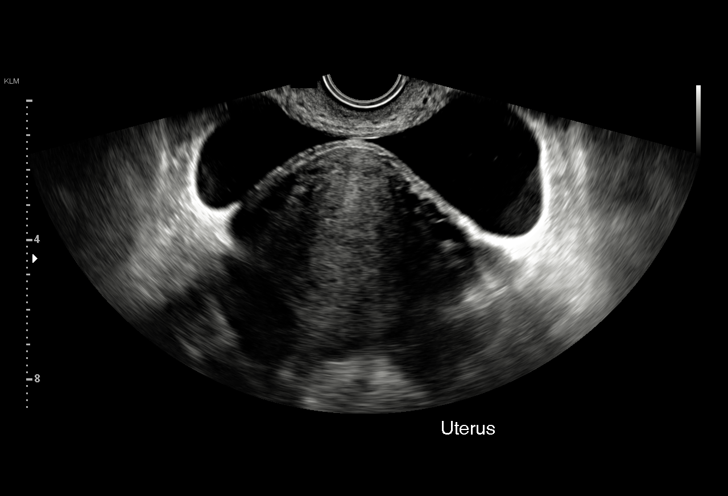
[im 23/48]
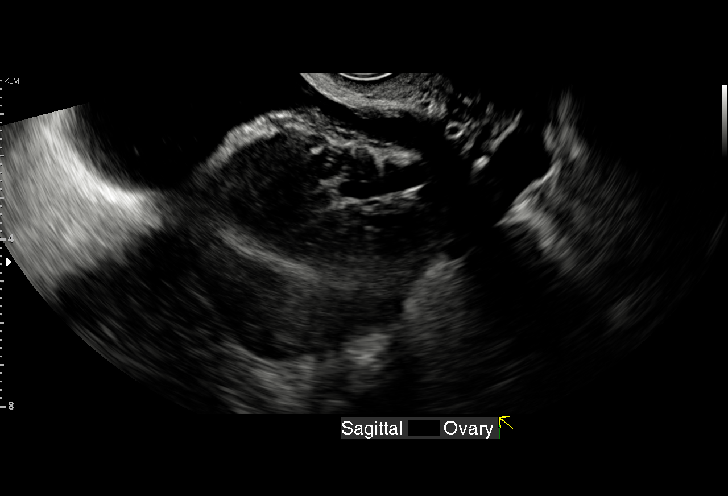
[im 27/48]
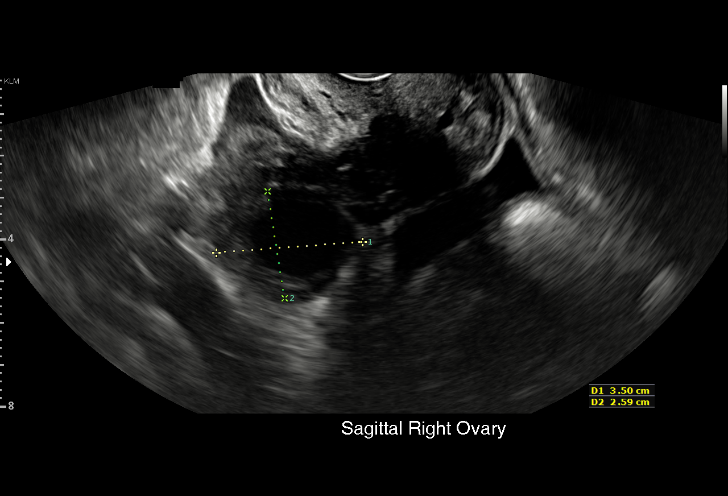
[im 30/48]
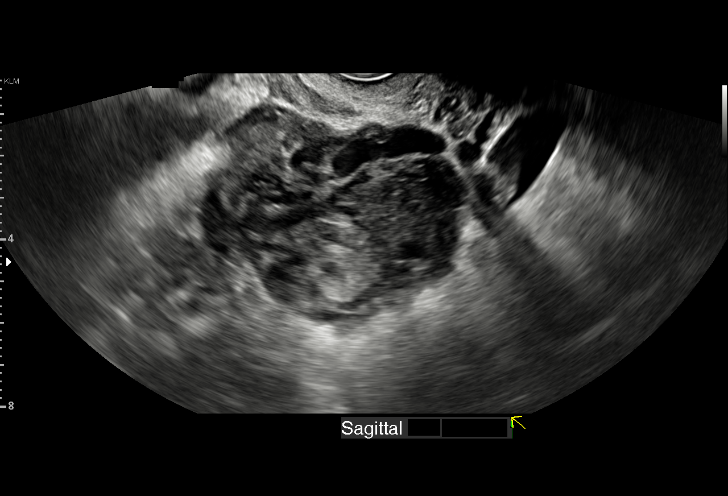
[im 34/48]
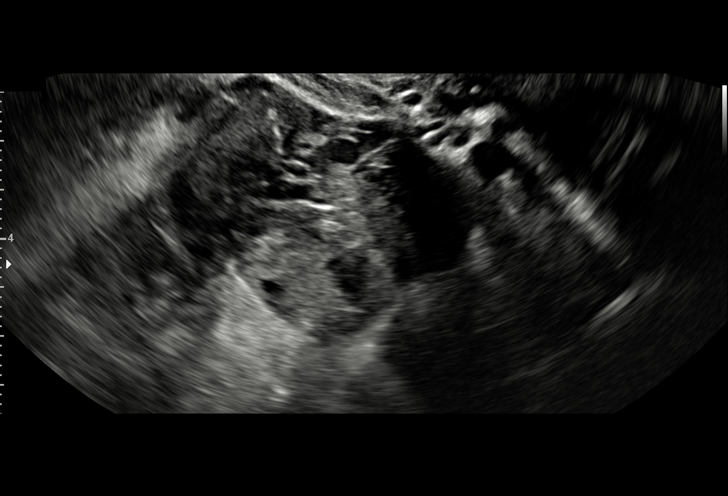
[im 37/48]
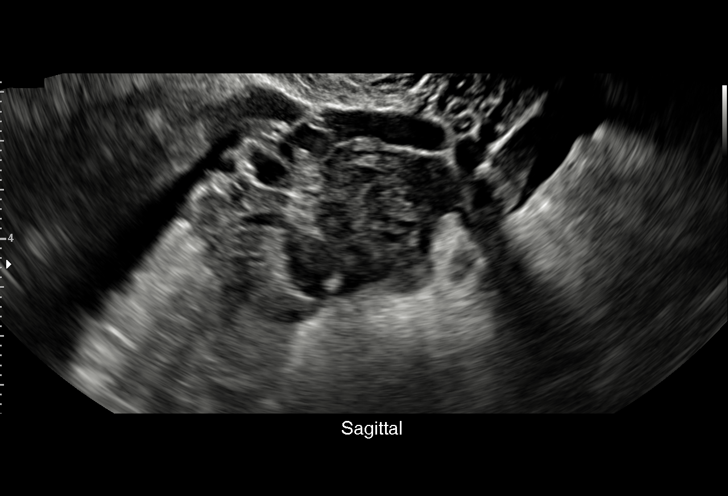
[im 41/48]
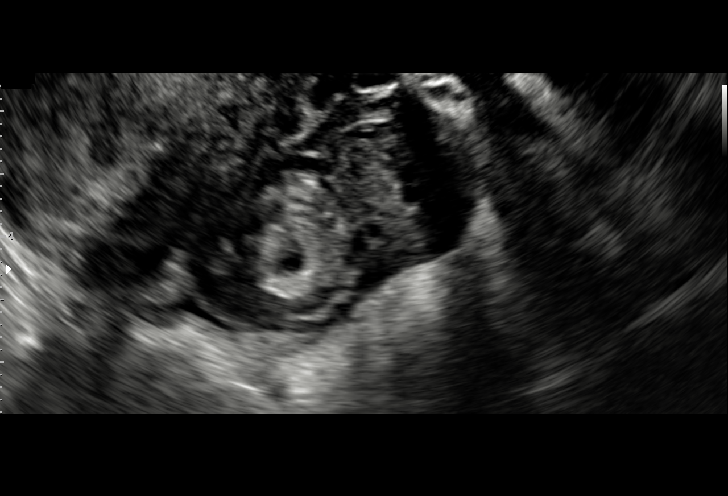
[im 44/48]
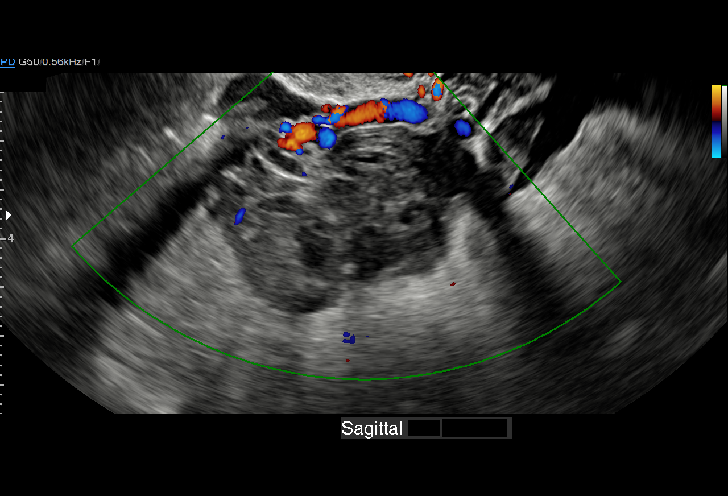
[im 48/48]
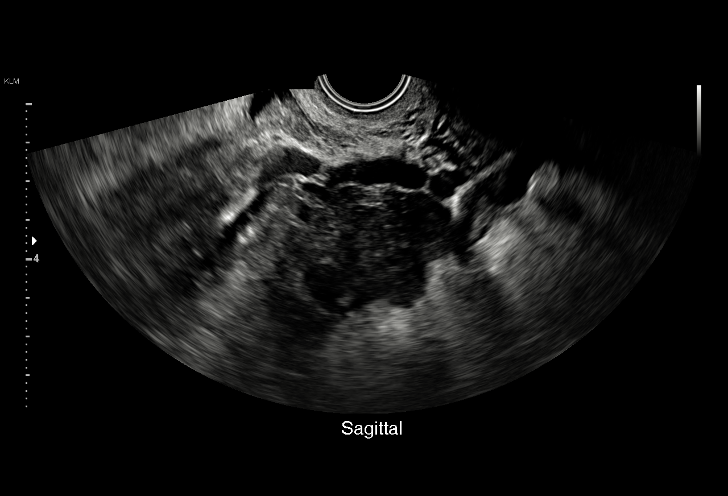

[14 of 28 positions shown; findings below may reference images not displayed]

FINDINGS: Intrauterine gestational sac: None

Yolk sac:  Not Visualized.

Embryo:  Not Visualized.

Cardiac Activity: Not Visualized.

Maternal uterus/adnexae: No intrauterine gestation identified. The
right ovary is normal in appearance measuring 3.5 x 2.6 x 2.0 cm.

There is a left adnexal soft tissue mass which measures 5.5 x 3.5 x
4.7 cm. This is nonspecific in etiology however is favored to
represent the left ovary and an adjacent mass between the ovary and
the uterus concerning for ectopic pregnancy. The portion of the mass
which may represent the suspected ectopic pregnancy is not able to
be separated with manual palpation from the suspected ovary and
therefore the possibility of an enlarged ovary with multiple
follicles is not entirely excluded however not favored. This adnexal
mass is favored to represent the left ovary and adjacent soft tissue
mass with central fluid which may represent ectopic pregnancy and
gestational sac surrounded by a small amount of blood products.
IMPRESSION: Heterogeneous mass within the left adnexa. A portion of this mass is
favored to represent the normal appearing left ovary. The more
central portion is favored to represent an ectopic pregnancy,
potentially within the distal aspect of the tube, immediately
adjacent to the left ovary however this is not completely confirmed
as these two suspected masses do not move separately with manual
palpation. While findings are felt to represent an ectopic pregnancy
the differential consideration of an enlarged ovary with multiple
follicles/corpus luteum is not entirely excluded.

Critical Value/emergent results were called by telephone at the time
who verbally acknowledged these results.

## 2019-12-22 ENCOUNTER — Ambulatory Visit (INDEPENDENT_AMBULATORY_CARE_PROVIDER_SITE_OTHER): Payer: Managed Care, Other (non HMO) | Admitting: Bariatrics

## 2019-12-31 ENCOUNTER — Ambulatory Visit (INDEPENDENT_AMBULATORY_CARE_PROVIDER_SITE_OTHER): Payer: Managed Care, Other (non HMO) | Admitting: Family Medicine

## 2020-01-05 ENCOUNTER — Ambulatory Visit (INDEPENDENT_AMBULATORY_CARE_PROVIDER_SITE_OTHER): Payer: Managed Care, Other (non HMO) | Admitting: Bariatrics

## 2020-01-06 ENCOUNTER — Ambulatory Visit (INDEPENDENT_AMBULATORY_CARE_PROVIDER_SITE_OTHER): Payer: Managed Care, Other (non HMO) | Admitting: Family Medicine

## 2020-01-06 ENCOUNTER — Other Ambulatory Visit: Payer: Self-pay

## 2020-01-06 ENCOUNTER — Encounter (INDEPENDENT_AMBULATORY_CARE_PROVIDER_SITE_OTHER): Payer: Self-pay | Admitting: Family Medicine

## 2020-01-06 VITALS — BP 125/84 | HR 64 | Temp 98.0°F | Ht 71.0 in | Wt 285.0 lb

## 2020-01-06 DIAGNOSIS — R5383 Other fatigue: Secondary | ICD-10-CM

## 2020-01-06 DIAGNOSIS — E559 Vitamin D deficiency, unspecified: Secondary | ICD-10-CM | POA: Diagnosis not present

## 2020-01-06 DIAGNOSIS — G4733 Obstructive sleep apnea (adult) (pediatric): Secondary | ICD-10-CM

## 2020-01-06 DIAGNOSIS — Z1331 Encounter for screening for depression: Secondary | ICD-10-CM

## 2020-01-06 DIAGNOSIS — E538 Deficiency of other specified B group vitamins: Secondary | ICD-10-CM | POA: Diagnosis not present

## 2020-01-06 DIAGNOSIS — F418 Other specified anxiety disorders: Secondary | ICD-10-CM

## 2020-01-06 DIAGNOSIS — Z0289 Encounter for other administrative examinations: Secondary | ICD-10-CM

## 2020-01-06 DIAGNOSIS — Z9189 Other specified personal risk factors, not elsewhere classified: Secondary | ICD-10-CM

## 2020-01-06 DIAGNOSIS — D649 Anemia, unspecified: Secondary | ICD-10-CM

## 2020-01-06 DIAGNOSIS — Z6839 Body mass index (BMI) 39.0-39.9, adult: Secondary | ICD-10-CM

## 2020-01-06 NOTE — Progress Notes (Signed)
Chief Complaint:   OBESITY [name] (MR# [id number]) is a 34 y.o. female who presents for evaluation and treatment of obesity and related comorbidities. Current BMI is Body mass index is 39.75 kg/m. Carrie Shaw has been struggling with her weight for many years and has been unsuccessful in either losing weight, maintaining weight loss, or reaching her healthy weight goal.  Carrie Shaw is status post gastric bypass in 2019. She went from 410 lbs to 275 lbs in 12 months. She started regaining after 18 months, but she has kept most of the weight she lost off.  Carrie Shaw is currently in the action stage of change and ready to dedicate time achieving and maintaining a healthier weight. Carrie Shaw is interested in becoming our patient and working on intensive lifestyle modifications including (but not limited to) diet and exercise for weight loss.  Carrie Shaw's habits were reviewed today and are as follows: Her family eats meals together, she thinks her family will eat healthier with her, her desired weight loss is 95-110 lbs, she has been heavy most of her life, she started gaining weight in 6th grade at 34 years old, her heaviest weight ever was 410 pounds, she has significant food cravings issues, she snacks frequently in the evenings, she frequently makes poor food choices and she struggles with emotional eating.  Depression Screen Carrie Shaw's Food and Mood (modified PHQ-9) score was 6.  Depression screen [facility] 2/9 01/06/2020  Decreased Interest 0  Down, Depressed, Hopeless 1  PHQ - 2 Score 1  Altered sleeping 2  Tired, decreased energy 1  Change in appetite 2  Feeling bad or failure about yourself  0  Trouble concentrating 0  Moving slowly or fidgety/restless 0  Suicidal thoughts 0  PHQ-9 Score 6  Difficult doing work/chores Not difficult at all   Subjective:   1. Other fatigue Carrie Shaw admits to daytime somnolence and denies waking up still tired. Patent has a history of  symptoms of daytime fatigue and morning headache. Carrie Shaw generally gets 7 hours of sleep per night, and states that she has difficulty falling asleep. Snoring is not present. Apneic episodes are not present. Epworth Sleepiness Score is 2.  2. B12 deficiency Carrie Shaw is currently on OTC multivitamins.    3. Vitamin D deficiency Carrie Shaw is currently on OTC multivitamins.  4. Anxiety with depression Carrie Shaw is not currently on SSRI/SNRI.  5. Anemia, unspecified type Carrie Shaw is currently on OTC multivitamins.  6. Obstructive sleep apnea Carrie Shaw has a history of sleep apnea. She is wanting to loss weight to reduce her symptoms.   7. At risk for malnutrition [name] is at increased risk for malnutrition due to dumping syndrome status post gastric bypass.  Assessment/Plan:   1. Other fatigue [name] does feel that her weight is causing her energy to be lower than it should be. Fatigue may be related to obesity, depression or many other causes. Labs will be ordered, and in the meanwhile, Lissy will focus on self care including making healthy food choices, increasing physical activity and focusing on stress reduction.  - EKG 12-Lead - CBC with Differential/Platelet - Comprehensive metabolic panel - Hemoglobin A1c - Insulin, random - Lipid Panel With LDL/HDL Ratio - T3 - T4, free - TSH  2. B12 deficiency The diagnosis was reviewed with the patient. We will continue to monitor. Carrie Shaw will start her journaling meal plan. We will check labs today. Orders and follow up as documented in patient record.  - Vitamin B12  3. Vitamin  D deficiency Low Vitamin D level contributes to fatigue and are associated with obesity, breast, and colon cancer. Carrie Shaw will follow-up for routine testing of Vitamin D, at least 2-3 times per year to avoid over-replacement. We will check labs today.  - VITAMIN D 25 Hydroxy (Vit-D Deficiency, Fractures)  4. Anxiety with depression Behavior  modification techniques were discussed today to help Carrie Shaw deal with her anxiety. We will refer to Dr. Dewaine Conger, our Bariatric Psychologist for evaluation. Orders and follow up as documented in patient record.   5. Anemia, unspecified type [name] will start her journaling meal plan. We will check labs today. Orders and follow up as documented in patient record.  - Folate  6. Obstructive sleep apnea IC was done today. Intensive lifestyle modifications are the first line treatment for this issue. We discussed several lifestyle modifications today. Chrissy will start her journaling meal plan, and she will continue to work on exercise and weight loss efforts. We will continue to monitor. Orders and follow up as documented in patient record.   7. Depression screening Carrie Shaw had a positive depression screening. Depression is commonly associated with obesity and often results in emotional eating behaviors. We will monitor this closely and work on CBT to help improve the non-hunger eating patterns. Referral to Psychology may be required if no improvement is seen as she continues in our clinic.  8. At risk for malnutrition Carrie Shaw was given approximately 30 minutes of counseling today regarding prevention of malnutrition and ways to meet macronutrient goals.  9. Class 2 severe obesity with serious comorbidity and body mass index (BMI) of 39.0 to 39.9 in adult, unspecified obesity type (HCC) Carrie Shaw is currently in the action stage of change and her goal is to continue with weight loss efforts. I recommend Carrie Shaw begin the structured treatment plan as follows:  She has agreed to keeping a food journal and adhering to recommended goals of 1300-1600 calories and 85+ grams of protein daily.  Exercise goals: No exercise has been prescribed for now, while we concentrate on nutritional changes.   Behavioral modification strategies: increasing lean protein intake, no skipping meals and meal planning  and cooking strategies.  She was informed of the importance of frequent follow-up visits to maximize her success with intensive lifestyle modifications for her multiple health conditions. She was informed we would discuss her lab results at her next visit unless there is a critical issue that needs to be addressed sooner. Karisha agreed to keep her next visit at the agreed upon time to discuss these results.  Objective:   Blood pressure 125/84, pulse 64, temperature 98 F (36.7 C), temperature source Oral, height 5\' 11"  (1.803 m), weight 285 lb (129.3 kg), last menstrual period 01/05/2020, SpO2 99 %. Body mass index is 39.75 kg/m.  EKG: Normal sinus rhythm, rate 58 BPM.  Indirect Calorimeter completed today shows a VO2 of 340 and a REE of 2363.  Her calculated basal metabolic rate is 2364 thus her basal metabolic rate is better than expected.  General: Cooperative, alert, well developed, in no acute distress. HEENT: Conjunctivae and lids unremarkable. Cardiovascular: Regular rhythm.  Lungs: Normal work of breathing. Neurologic: No focal deficits.   Lab Results  Component Value Date   CREATININE 0.64 01/02/2018   BUN 9 01/02/2018   NA 137 01/02/2018   K 4.0 01/02/2018   CL 105 01/02/2018   CO2 24 01/02/2018   Lab Results  Component Value Date   ALT 212 (H) 01/02/2018   AST 202 (  H) 01/02/2018   ALKPHOS 47 01/02/2018   BILITOT 0.6 01/02/2018   No results found for: HGBA1C No results found for: INSULIN Lab Results  Component Value Date   TSH 1.86 05/17/2016   Lab Results  Component Value Date   CHOL 147 12/25/2014   HDL 43 (L) 12/25/2014   LDLCALC 93 12/25/2014   TRIG 57 12/25/2014   CHOLHDL 3.4 12/25/2014   Lab Results  Component Value Date   WBC 11.0 (H) 01/03/2018   HGB 12.0 01/03/2018   HCT 37.4 01/03/2018   MCV 88.0 01/03/2018   PLT 308 01/03/2018   No results found for: IRON, TIBC, FERRITIN  Attestation Statements:   Reviewed by clinician on day of  visit: allergies, medications, problem list, medical history, surgical history, family history, social history, and previous encounter notes.   I, Trixie Dredge, am acting as transcriptionist for Dennard Nip, MD.  I have reviewed the above documentation for accuracy and completeness, and I agree with the above. - Dennard Nip, MD

## 2020-01-06 NOTE — Progress Notes (Signed)
Office: [phone]  /  Fax: [phone]    Date: January 12, 2020   Appointment Start Time: 9:01am Duration: 30 minutes Provider: Glennie Isle, Psy.D. Type of Session: Intake for Individual Therapy  Location of Patient: Home Location of Provider: Provider's Home Type of Contact: Telepsychological Visit via [employer] Web[employer]  Informed Consent: Prior to proceeding with today's appointment, two pieces of identifying information were obtained. In addition, Carrie Shaw's physical location at the time of this appointment was obtained as well a phone number she could be reached at in the event of technical difficulties. Carrie Shaw and this provider participated in today's telepsychological service.   The provider's role was explained to Carrie Shaw. The provider reviewed and discussed issues of confidentiality, privacy, and limits therein (e.g., reporting obligations). In addition to verbal informed consent, written informed consent for psychological services was obtained prior to the initial appointment. Since the clinic is not a 24/7 crisis center, mental health emergency resources were shared and this  provider explained MyChart, e-mail, voicemail, and/or other messaging systems should be utilized only for non-emergency reasons. This provider also explained that information obtained during appointments will be placed in Carrie Shaw's medical record and relevant information will be shared with other providers at [facility] for coordination of care. Moreover, Carrie Shaw agreed information may be shared with other [facility] providers as needed for coordination of care. By signing the service agreement document, Carrie Shaw provided written consent for coordination of care. Prior to initiating telepsychological services, Carrie Shaw completed an informed consent document, which included the development of a safety plan (i.e., an emergency contact, nearest emergency room, and  emergency resources) in the event of an emergency/crisis. Carrie Shaw expressed understanding of the rationale of the safety plan. Carrie Shaw verbally acknowledged understanding she is ultimately responsible for understanding her insurance benefits for telepsychological and in-person services. This provider also reviewed confidentiality, as it relates to telepsychological services, as well as the rationale for telepsychological services (i.e., to reduce exposure risk to COVID-19). Carrie Shaw  acknowledged understanding that appointments cannot be recorded without both party consent and she is aware she is responsible for securing confidentiality on her end of the session. Carrie Shaw verbally consented to proceed.  Chief Complaint/HPI: Carrie Shaw was referred by Carrie Shaw. Dennard Nip due to anxiety with depression. Per the note for the initial visit with Carrie Shaw. Dennard Nip on January 06, 2020, "Denece is not currently on SSRI/SNRI." The note for the initial appointment with Carrie Shaw. Dennard Nip indicated the following: "Greenley's habits were reviewed today and are as follows: Her family eats meals together, she thinks her family will eat healthier with her, her desired weight loss is 95-110 lbs, she has been heavy most of her life, she started gaining weight in 6th grade at 34 years old, her heaviest weight ever was 410 pounds, she has significant food cravings issues, she snacks frequently in the evenings, she frequently makes poor food choices and she struggles with emotional eating." Lalia's Food and Mood (modified PHQ-9) score on January 06, 2020 was 6.  During today's appointment, Carrie Shaw was verbally administered a questionnaire assessing various behaviors related to emotional eating. [place] endorsed the following: experience food cravings on a regular basis, find food is comforting to you, overeat frequently when you are bored or lonely and not worry about what you eat when you are in a good mood. She shared she craves  salty and sweet foods (e.g., cake, brownies). Carrie Shaw believes the onset of emotional eating was likely during childhood and described  the current frequency of emotional eating as "probably daily." In addition, Carrie Shaw denied engagement in binge eating since gastric bypass on January 01, 2018. She believes she engaged in binge eating prior to bariatric surgery. She recalled in her early 43s she would restrict food intake as well as use laxatives for weight loss. She denied a history of purging. Carrie Shaw denied ever being diagnosed with an eating disorder or treated for emotional eating. Moreover, Carrie Shaw indicated boredom triggers emotional eating, whereas going out makes emotional eating better. Furthermore, Carrie Shaw denied other problems of concern.    Mental Status Examination:  Appearance: well groomed and appropriate hygiene  Behavior: appropriate to circumstances Mood: euthymic Affect: mood congruent Speech: normal in rate, volume, and tone Eye Contact: appropriate Psychomotor Activity: appropriate Gait: unable to assess Thought Process: linear, logical, and goal directed  Thought Content/Perception: denies suicidal and homicidal ideation, plan, and intent and no hallucinations, delusions, bizarre thinking or behavior reported or observed Orientation: time, person, place and purpose of appointment Memory/Concentration: memory, attention, language, and fund of knowledge intact  Insight/Judgment: good  Family & Psychosocial History: Carrie Shaw reported she is married and she has two children (ages 22 and 55). She indicated she is currently employed with [employer] as a Holiday representative. Additionally, Carrie Shaw shared her highest level of education obtained is "some college." Currently, Carrie Shaw's social support system consists of her husband and sister. Moreover, Carrie Shaw stated she resides with her husband and children. She added her husband "is on the road" for work; therefore, she sees him every  4-6 weeks.   Medical History:  Past Medical History:  Diagnosis Date  . ADD (attention deficit disorder)   . Anemia   . Anxiety   . Back pain   . Depression   . Gallbladder problem   . HSV infection   . Hypertension   . Joint pain   . Lower extremity edema   . Obesity   . Sleep apnea    mild no mask  . SVD (spontaneous vaginal delivery)    x 2  . Termination of pregnancy (fetus)    x 2  . UTI (urinary tract infection)   . Vitamin B12 deficiency   . Vitamin D deficiency    Past Surgical History:  Procedure Laterality Date  . APPENDECTOMY    . CYSTOSCOPY N/A 08/16/2017   Procedure: CYSTOSCOPY;  Surgeon: Conan Bowens, MD;  Location: [facility];  Service: Gynecology;  Laterality: N/A;  . GASTRIC ROUX-EN-Y N/A 01/01/2018   Procedure: LAPAROSCOPIC ROUX-EN-Y GASTRIC BYPASS WITH UPPER ENDOSCOPY;  Surgeon: Gaynelle Adu, MD;  Location: [facility];  Service: General;  Laterality: N/A;  . LAPAROSCOPY N/A 08/16/2017   Procedure: LAPAROSCOPY DIAGNOSTIC;  Surgeon: Conan Bowens, MD;  Location: [facility];  Service: Gynecology;  Laterality: N/A;  . LAPAROTOMY N/A 08/16/2017   Procedure: EXPLORATORY LAPAROTOMY WITH REMOVAL OF LEFT ECTOPIC PREGNANCY, LEFT SALPINGECTOMY AND CYSTOTOMY OF BLADDER;  Surgeon: Conan Bowens, MD;  Location: [facility];  Service: Gynecology;  Laterality: N/A;  . LYSIS OF ADHESION N/A 08/16/2017   Procedure: LYSIS OF ADHESION;  Surgeon: Conan Bowens, MD;  Location: [facility];  Service: Gynecology;  Laterality: N/A;  . Tummy tuck     10/2019  . UNILATERAL SALPINGECTOMY Left 08/16/2017   Procedure: UNILATERAL SALPINGECTOMY;  Surgeon: Conan Bowens, MD;  Location: [facility];  Service: Gynecology;  Laterality: Left;   Current Outpatient Medications on File Prior to Visit  Medication Sig Dispense Refill  . Multiple Vitamins-Minerals (MULTIVITAMIN  WITH MINERALS) tablet Take 1 tablet by mouth daily.     No current facility-administered medications on file prior to visit.  Carrie Shaw  denied a history of head injuries and loss of consciousness.    Mental Health History: Carrie Shaw reported a history of mental health services for bariatric surgery. She reported meeting with a psychiatrist (Carrie Shaw. Maggie Schwalbe with [facility]) starting in January of this year, noting their last appointment was in March. She stated her psychiatrist prescribes Lamictal for anxiety, noting she has not taken it in about a week due to side effects. It was recommended she reach out to Carrie Shaw. Maggie Schwalbe; she agree. Dare reported there is no history of hospitalizations for psychiatric concerns. Ahja endorsed a family history of mental health related concerns. She stated her sister is diagnosed with bipolar disorder and schizophrenia. She added anxiety and depression runs on both sides of her family. Dea reported there is no history of trauma including psychological, physical  and sexual abuse, as well as neglect.   Soundra described her typical mood lately as "not that bad," adding she becomes irritated when individuals do not follow through with plans. Notably, she reported a reduction in irritability. Leslie also reported experiencing anger, noting she may yell and cry. She denied physical angry outbursts. She also denied the angry outbursts being toward her children. Rether denied current alcohol use. She endorsed she vapes (nicotine) daily. She denied illicit/recreational substance use. Regarding caffeine intake, Chaunceereported consuming two cups of coffee daily. Furthermore, Loriene indicated she is not experiencing the following: hallucinations and delusions, paranoia, symptoms of mania , social withdrawal, panic attacks and decreased motivation. She also denied history of and current suicidal ideation, plan, and intent; history of and current homicidal ideation, plan, and intent; and history of and current engagement in self-harm.  The following strengths were reported by Yezenia: motivated, good follow  through, likes to stay busy, and good with coming up with ideas. The following strengths were observed by this provider: ability to express thoughts and feelings during the therapeutic session, ability to establish and benefit from a therapeutic relationship, willingness to work toward established goal(s) with the clinic and ability to engage in reciprocal conversation.  Legal History: Mardy reported there is no history of legal involvement.   Structured Assessments Results: The Patient Health Questionnaire-9 (PHQ-9) is a self-report measure that assesses symptoms and severity of depression over the course of the last two weeks. Shaddai obtained a score of 3 suggesting minimal depression. Jauna finds the endorsed symptoms to be somewhat difficult. [0= Not at all; 1= Several days; 2= More than half the days; 3= Nearly every day] Little interest or pleasure in doing things 0  Feeling down, depressed, or hopeless 0  Trouble falling or staying asleep, or sleeping too much 1  Feeling tired or having little energy 1  Poor appetite or overeating 1  Feeling bad about yourself --- or that you are a failure or have let yourself or your family down 0  Trouble concentrating on things, such as reading the newspaper or watching television 0  Moving or speaking so slowly that other people could have noticed? Or the opposite --- being so fidgety or restless that you have been moving around a lot more than usual 0  Thoughts that you would be better off dead or hurting yourself in some way 0  PHQ-9 Score 3    The Generalized Anxiety Disorder-7 (GAD-7) is a brief self-report measure that assesses symptoms of anxiety over the  course of the last two weeks. Nasiyah obtained a score of 1 suggesting minimal anxiety. Shawnya finds the endorsed symptoms to be not difficult at all. [0= Not at all; 1= Several days; 2= Over half the days; 3= Nearly every day] Feeling nervous, anxious, on edge 0  Not being able to  stop or control worrying 0  Worrying too much about different things 0  Trouble relaxing 0  Being so restless that it's hard to sit still 0  Becoming easily annoyed or irritable 1  Feeling afraid as if something awful might happen 0  GAD-7 Score 1   Interventions:  Conducted a chart review Focused on rapport building Verbally administered PHQ-9 and GAD-7 for symptom monitoring Verbally administered Food & Mood questionnaire to assess various behaviors related to emotional eating. Provided emphatic reflections and validation Collaborated with patient on a treatment goal  Psychoeducation provided regarding physical versus emotional hunger  Provisional DSM-5 Diagnosis(es): 307.59 (F50.8) Other Specified Feeding or Eating Disorder, Emotional Eating Behaviors  Plan: Roland appears able and willing to participate as evidenced by collaboration on a treatment goal, engagement in reciprocal conversation, and asking questions as needed for clarification. Based on appointment availability due to this provider being out of the office in the coming weeks, the next appointment will be scheduled in approximately three weeks, which will be via MyChart Video Visit. The following treatment goal was established: increase coping skills. This provider will regularly review the treatment plan and medical chart to keep informed of status changes. Raphaella expressed understanding and agreement with the initial treatment plan of care. Crosby will be sent a handout via e-mail to utilize between now and the next appointment to increase awareness of hunger patterns and subsequent eating. Neytiri provided verbal consent during today's appointment for this provider to send the handout via e-mail.

## 2020-01-07 LAB — CBC WITH DIFFERENTIAL/PLATELET
Basophils Absolute: 0 10*3/uL (ref 0.0–0.2)
Basos: 0 %
EOS (ABSOLUTE): 0.1 10*3/uL (ref 0.0–0.4)
Eos: 1 %
Hematocrit: 33.5 % — ABNORMAL LOW (ref 34.0–46.6)
Hemoglobin: 10.3 g/dL — ABNORMAL LOW (ref 11.1–15.9)
Immature Grans (Abs): 0 10*3/uL (ref 0.0–0.1)
Immature Granulocytes: 0 %
Lymphocytes Absolute: 2 10*3/uL (ref 0.7–3.1)
Lymphs: 33 %
MCH: 27.1 pg (ref 26.6–33.0)
MCHC: 30.7 g/dL — ABNORMAL LOW (ref 31.5–35.7)
MCV: 88 fL (ref 79–97)
Monocytes Absolute: 0.4 10*3/uL (ref 0.1–0.9)
Monocytes: 6 %
Neutrophils Absolute: 3.5 10*3/uL (ref 1.4–7.0)
Neutrophils: 60 %
Platelets: 366 10*3/uL (ref 150–450)
RBC: 3.8 x10E6/uL (ref 3.77–5.28)
RDW: 15.2 % (ref 11.7–15.4)
WBC: 5.9 10*3/uL (ref 3.4–10.8)

## 2020-01-07 LAB — VITAMIN D 25 HYDROXY (VIT D DEFICIENCY, FRACTURES): Vit D, 25-Hydroxy: 17.8 ng/mL — ABNORMAL LOW (ref 30.0–100.0)

## 2020-01-07 LAB — COMPREHENSIVE METABOLIC PANEL
ALT: 31 IU/L (ref 0–32)
AST: 23 IU/L (ref 0–40)
Albumin/Globulin Ratio: 1.2 (ref 1.2–2.2)
Albumin: 3.8 g/dL (ref 3.8–4.8)
Alkaline Phosphatase: 67 IU/L (ref 39–117)
BUN/Creatinine Ratio: 15 (ref 9–23)
BUN: 9 mg/dL (ref 6–20)
Bilirubin Total: 0.3 mg/dL (ref 0.0–1.2)
CO2: 20 mmol/L (ref 20–29)
Calcium: 8.6 mg/dL — ABNORMAL LOW (ref 8.7–10.2)
Chloride: 109 mmol/L — ABNORMAL HIGH (ref 96–106)
Creatinine, Ser: 0.62 mg/dL (ref 0.57–1.00)
GFR calc Af Amer: 138 mL/min/{1.73_m2} (ref >59)
GFR calc non Af Amer: 120 mL/min/{1.73_m2} (ref >59)
Globulin, Total: 3.3 g/dL (ref 1.5–4.5)
Glucose: 72 mg/dL (ref 65–99)
Potassium: 4.5 mmol/L (ref 3.5–5.2)
Sodium: 141 mmol/L (ref 134–144)
Total Protein: 7.1 g/dL (ref 6.0–8.5)

## 2020-01-07 LAB — VITAMIN B12: Vitamin B-12: 255 pg/mL (ref 232–1245)

## 2020-01-07 LAB — T3: T3, Total: 91 ng/dL (ref 71–180)

## 2020-01-07 LAB — LIPID PANEL WITH LDL/HDL RATIO
Cholesterol, Total: 123 mg/dL (ref 100–199)
HDL: 44 mg/dL (ref >39)
LDL Chol Calc (NIH): 70 mg/dL (ref 0–99)
LDL/HDL Ratio: 1.6 ratio (ref 0.0–3.2)
Triglycerides: 34 mg/dL (ref 0–149)
VLDL Cholesterol Cal: 9 mg/dL (ref 5–40)

## 2020-01-07 LAB — FOLATE: Folate: 6.1 ng/mL (ref >3.0)

## 2020-01-07 LAB — HEMOGLOBIN A1C
Est. average glucose Bld gHb Est-mCnc: 94 mg/dL
Hgb A1c MFr Bld: 4.9 % (ref 4.8–5.6)

## 2020-01-07 LAB — INSULIN, RANDOM: INSULIN: 7.8 u[IU]/mL (ref 2.6–24.9)

## 2020-01-07 LAB — TSH: TSH: 1.44 u[IU]/mL (ref 0.450–4.500)

## 2020-01-07 LAB — T4, FREE: Free T4: 1.16 ng/dL (ref 0.82–1.77)

## 2020-01-12 ENCOUNTER — Ambulatory Visit (INDEPENDENT_AMBULATORY_CARE_PROVIDER_SITE_OTHER): Payer: Managed Care, Other (non HMO) | Admitting: Psychology

## 2020-01-12 ENCOUNTER — Other Ambulatory Visit: Payer: Self-pay

## 2020-01-12 DIAGNOSIS — F5089 Other specified eating disorder: Secondary | ICD-10-CM

## 2020-01-20 ENCOUNTER — Ambulatory Visit (INDEPENDENT_AMBULATORY_CARE_PROVIDER_SITE_OTHER): Payer: Managed Care, Other (non HMO) | Admitting: Family Medicine

## 2020-01-20 NOTE — Progress Notes (Signed)
Office: [phone]  /  Fax: [phone]    Date: Feb 03, 2020   Appointment Start Time: 10:30am Duration: 27 minutes Provider: Glennie Isle, Psy.D. Type of Session: Individual Therapy  Location of Patient: Home Location of Provider: Provider's Home Type of Contact: Telepsychological Visit via MyChart Video Visit  Session Content: Carrie Shaw is a 34 y.o. female presenting via MyChart Video Visit for a follow-up appointment to address the previously established treatment goal of increasing coping skills. Today's appointment was a telepsychological visit due to COVID-19. Carrie Shaw provided verbal consent for today's telepsychological appointment and she is aware she is responsible for securing confidentiality on her end of the session. Prior to proceeding with today's appointment, Carrie Shaw's physical location at the time of this appointment was obtained as well a phone number she could be reached at in the event of technical difficulties. Carrie Shaw and this provider participated in today's telepsychological service.   Carrie Shaw acknowledged understanding that for today's appointment and future telepsychological appointments, MyChart Video Visit will be utilized. She also verbally acknowledged understanding that the information outlined in the telepsychological informed consent form signed at the onset of treatment would still be applicable despite the change in the videoconferencing platform.   This provider conducted a brief check-in. Carrie Shaw reported, "I'm not really feeling the appointment today," noting she is experiencing decreased motivation. This was explored and processed. She believes it is secondary to job dissatisfaction due to having to work from home. Psychoeducation regarding the importance of self-care utilizing the oxygen mask metaphor was provided. Psychoeducation regarding pleasurable activities, including its impact on emotional eating and overall well-being was also provided.  Carrie Shaw was provided with a handout with various options of pleasurable activities, and was encouraged to engage in one activity a day and additional activities as needed when triggered to emotionally eat. Carrie Shaw agreed. Carrie Shaw provided verbal consent during today's appointment for this provider to send a handout with pleasurable activities via e-mail. She was receptive to the aforementioned as evidenced by her stating, "I feel good about it." Of note, Carrie Shaw reported she has not reached out to her psychiatrist regarding discontinuing Lamictal. She noted she would call Dr. Altamese [place] today. She was encouraged to call and schedule an appointment with Dr. Leafy Ro as well; she agreed. Overall, Carrie Shaw was receptive to today's appointment as evidenced by openness to sharing, responsiveness to feedback, and willingness to engage in pleasurable activities to assist with coping.  Mental Status Examination:  Appearance: well groomed and appropriate hygiene  Behavior: appropriate to circumstances Mood: euthymic Affect: mood congruent Speech: normal in rate, volume, and tone Eye Contact: appropriate Psychomotor Activity: appropriate Gait: unable to assess Thought Process: linear, logical, and goal directed  Thought Content/Perception: no hallucinations, delusions, bizarre thinking or behavior reported or observed and denies suicidal and homicidal ideation, plan, and intent Orientation: time, person, place and purpose of appointment Memory/Concentration: memory, attention, language, and fund of knowledge intact  Insight/Judgment: good  Interventions:  Conducted a brief chart review Provided empathic reflections and validation Employed supportive psychotherapy interventions to facilitate reduced distress and to improve coping skills with identified stressors Employed motivational interviewing skills to assess patient's willingness/desire to adhere to recommended medical treatments and  assignments Psychoeducation provided regarding pleasurable activities Psychoeducation provided regarding self-care  DSM-5 Diagnosis(es): 307.59 (F50.8) Other Specified Feeding or Eating Disorder, Emotional Eating Behaviors  Treatment Goal & Progress: During the initial appointment with this provider, the following treatment goal was established: increase coping skills. Progress is limited, as Carrie Shaw has just begun treatment  with this provider; however, she is receptive to the interaction and interventions and rapport is being established.   Plan: The next appointment will be scheduled in two weeks, which will be via MyChart Video Visit. The next session will focus on working towards the established treatment goal.

## 2020-02-03 ENCOUNTER — Telehealth (INDEPENDENT_AMBULATORY_CARE_PROVIDER_SITE_OTHER): Payer: 59 | Admitting: Psychology

## 2020-02-03 DIAGNOSIS — F5089 Other specified eating disorder: Secondary | ICD-10-CM | POA: Diagnosis not present

## 2020-02-03 NOTE — Progress Notes (Signed)
  Office: [phone]  /  Fax: [phone]    Date: Feb 16, 2020   Appointment Start Time: 8:00am Duration: 21 minutes Provider: Lawerance Cruel, Psy.D. Type of Session: Individual Therapy  Location of Patient: Home Location of Provider: Provider's Home Type of Contact: Telepsychological Visit via MyChart Video Visit  Session Content: Carrie Shaw is a 34 y.o. female presenting via MyChart Video Visit for a follow-up appointment to address the previously established treatment goal of increasing coping skills. Today's appointment was a telepsychological visit due to COVID-19. Carrie Shaw provided verbal consent for today's telepsychological appointment and she is aware she is responsible for securing confidentiality on her end of the session. Prior to proceeding with today's appointment, Carrie Shaw's physical location at the time of this appointment was obtained as well a phone number she could be reached at in the event of technical difficulties. Carrie Shaw and this provider participated in today's telepsychological service.   This provider conducted a brief check-in. Carrie Shaw discussed engaging in self-care, noting "It was fine. It was cool. I like it." Positive reinforcement was provided. She also discussed feeling better after the last appointment with this provider. Regarding eating, Carrie Shaw noted she has "not been overeating" and is focusing on protein intake. Chart review revealed she does not have an appointment with Dr. Dalbert Garnet; therefore, it was recommended she call to schedule an appointment. She agreed. Psychoeducation regarding triggers for emotional eating was provided. Carrie Shaw was provided a handout, and encouraged to utilize the handout between now and the next appointment to increase awareness of triggers and frequency. Carrie Shaw agreed. This provider also discussed behavioral strategies for specific triggers, such as placing the utensil down when conversing to avoid mindless eating. Carrie Shaw  provided verbal consent during today's appointment for this provider to send a handout about triggers via e-mail. Carrie Shaw was receptive to today's appointment as evidenced by openness to sharing, responsiveness to feedback, and willingness to explore triggers for emotional eating.  Mental Status Examination:  Appearance: well groomed and appropriate hygiene  Behavior: appropriate to circumstances Mood: euthymic Affect: mood congruent Speech: normal in rate, volume, and tone Eye Contact: appropriate Psychomotor Activity: appropriate Gait: unable to assess Thought Process: linear, logical, and goal directed  Thought Content/Perception: no hallucinations, delusions, bizarre thinking or behavior reported or observed and no evidence of suicidal and homicidal ideation, plan, and intent Orientation: time, person, place and purpose of appointment Memory/Concentration: memory, attention, language, and fund of knowledge intact  Insight/Judgment: good  Interventions:  Conducted a brief chart review Provided empathic reflections and validation Reviewed content from the previous session Provided positive reinforcement Employed supportive psychotherapy interventions to facilitate reduced distress and to improve coping skills with identified stressors Employed motivational interviewing skills to assess patient's willingness/desire to adhere to recommended medical treatments and assignments Psychoeducation provided regarding triggers for emotional eating  DSM-5 Diagnosis(es): 307.59 (F50.8) Other Specified Feeding or Eating Disorder, Emotional Eating Behaviors  Treatment Goal & Progress: During the initial appointment with this provider, the following treatment goal was established: increase coping skills. Carrie Shaw has demonstrated progress in her goal as evidenced by increased awareness of hunger patterns and engagement in pleasurable activities.   Plan: The next appointment will be scheduled in  approximately two weeks, which will be via MyChart Video Visit. The next session will focus on working towards the established treatment goal.

## 2020-02-16 ENCOUNTER — Telehealth (INDEPENDENT_AMBULATORY_CARE_PROVIDER_SITE_OTHER): Payer: 59 | Admitting: Psychology

## 2020-02-16 ENCOUNTER — Other Ambulatory Visit: Payer: Self-pay

## 2020-02-16 DIAGNOSIS — F5089 Other specified eating disorder: Secondary | ICD-10-CM | POA: Diagnosis not present

## 2020-02-17 NOTE — Progress Notes (Unsigned)
Office: [phone]  /  Fax: [phone]    Date: March 02, 2020   Appointment Start Time: *** Duration: *** minutes Provider: Lawerance Cruel, Psy.D. Type of Session: Individual Therapy  Location of Patient: {gbptloc:23249} Location of Provider: Provider's Home Type of Contact: Telepsychological Visit via MyChart Video Visit  Session Content: Carrie Shaw is a 33 y.o. female presenting via MyChart Video Visit for a follow-up appointment to address the previously established treatment goal of increasing coping skills. Today's appointment was a telepsychological visit due to COVID-19. Carrie Shaw provided verbal consent for today's telepsychological appointment and she is aware she is responsible for securing confidentiality on her end of the session. Prior to proceeding with today's appointment, Carrie Shaw physical location at the time of this appointment was obtained as well a phone number she could be reached at in the event of technical difficulties. Carrie Shaw and this provider participated in today's telepsychological service.   This provider conducted a brief check-in and verbally administered the PHQ-9 and GAD-7. *** Carrie Shaw was receptive to today's appointment as evidenced by openness to sharing, responsiveness to feedback, and {gbreceptiveness:23401}.  Mental Status Examination:  Appearance: {Appearance:22431} Behavior: {Behavior:22445} Mood: {gbmood:21757} Affect: {Affect:22436} Speech: {Speech:22432} Eye Contact: {Eye Contact:22433} Psychomotor Activity: {Motor Activity:22434} Gait: {gbgait:23404} Thought Process: {thought process:22448}  Thought Content/Perception: {disturbances:22451} Orientation: {Orientation:22437} Memory/Concentration: {gbcognition:22449} Insight/Judgment: {Insight:22446}  Structured Assessments Results: The Patient Health Questionnaire-9 (PHQ-9) is a self-report measure that assesses symptoms and severity of depression over the course of the last two weeks.  Carrie Shaw obtained a score of *** suggesting {GBPHQ9SEVERITY:21752}. Carrie Shaw finds the endorsed symptoms to be {gbphq9difficulty:21754}. [0= Not at all; 1= Several days; 2= More than half the days; 3= Nearly every day] Little interest or pleasure in doing things ***  Feeling down, depressed, or hopeless ***  Trouble falling or staying asleep, or sleeping too much ***  Feeling tired or having little energy ***  Poor appetite or overeating ***  Feeling bad about yourself --- or that you are a failure or have let yourself or your family down ***  Trouble concentrating on things, such as reading the newspaper or watching television ***  Moving or speaking so slowly that other people could have noticed? Or the opposite --- being so fidgety or restless that you have been moving around a lot more than usual ***  Thoughts that you would be better off dead or hurting yourself in some way ***  PHQ-9 Score ***    The Generalized Anxiety Disorder-7 (GAD-7) is a brief self-report measure that assesses symptoms of anxiety over the course of the last two weeks. Carrie Shaw obtained a score of *** suggesting {gbgad7severity:21753}. Carrie Shaw finds the endorsed symptoms to be {gbphq9difficulty:21754}. [0= Not at all; 1= Several days; 2= Over half the days; 3= Nearly every day] Feeling nervous, anxious, on edge ***  Not being able to stop or control worrying ***  Worrying too much about different things ***  Trouble relaxing ***  Being so restless that it's hard to sit still ***  Becoming easily annoyed or irritable ***  Feeling afraid as if something awful might happen ***  GAD-7 Score ***   Interventions:  {Interventions for Progress Notes:23405}  DSM-5 Diagnosis(es): 307.59 (F50.8) Other Specified Feeding or Eating Disorder, Emotional Eating Behaviors  Treatment Goal & Progress: During the initial appointment with this provider, the following treatment goal was established: increase coping skills. Carrie Shaw  has demonstrated progress in her goal as evidenced by {gbtxprogress:22839}. Carrie Shaw also {gbtxprogress2:22951}.  Plan: The next appointment will be  scheduled in {gbweeks:21758}, which will be {gbtxmodality:23402}. The next session will focus on {Plan for Next Appointment:23400}.

## 2020-03-02 ENCOUNTER — Telehealth (INDEPENDENT_AMBULATORY_CARE_PROVIDER_SITE_OTHER): Payer: Self-pay | Admitting: Psychology

## 2020-10-26 IMAGING — MG DIGITAL DIAGNOSTIC BILATERAL MAMMOGRAM WITH TOMO AND CAD
6 of 12 series · 6 of 36 positions shown · non-contrast
Comparison: None

CLINICAL DATA: LEFT breast lump identified by the patient's
position, inferior LEFT breast per patient. Bilateral diffuse breast
pain for 2 days and tenderness to palpation within the lower LEFT
breast. This is patient's baseline mammogram.

EXAM:
DIGITAL DIAGNOSTIC BILATERAL MAMMOGRAM WITH CAD AND TOMO
ULTRASOUND LEFT BREAST

[R CC synth-2D (1 of 2)]
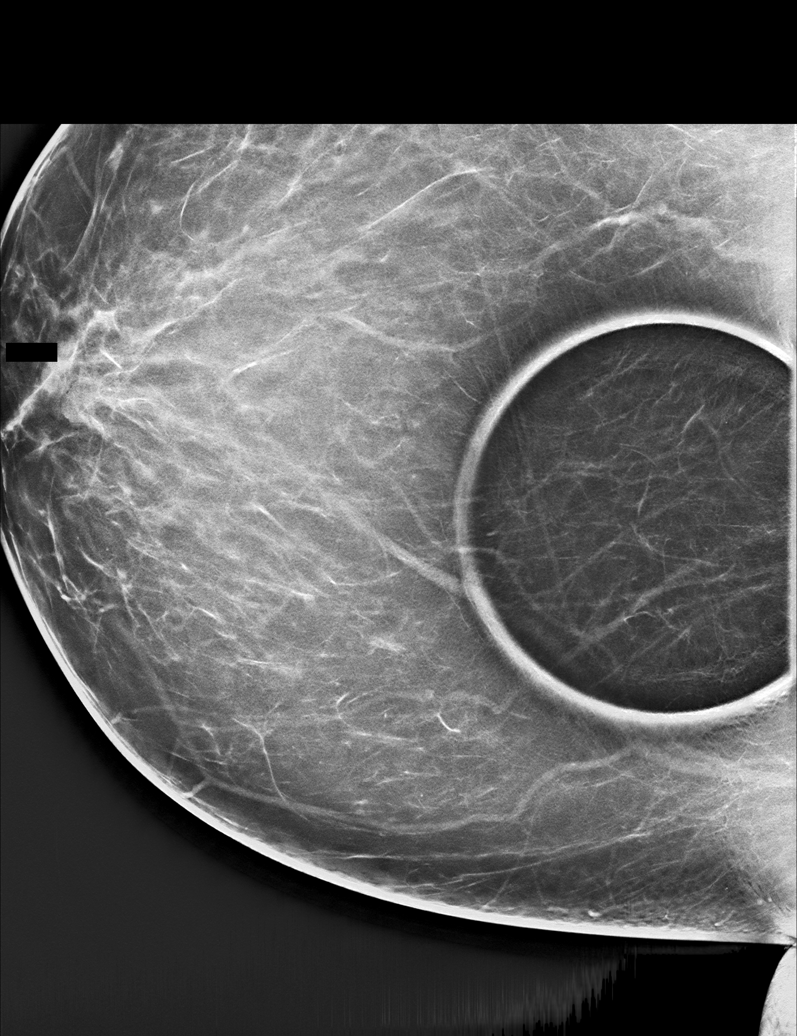

[L MLO synth-2D]
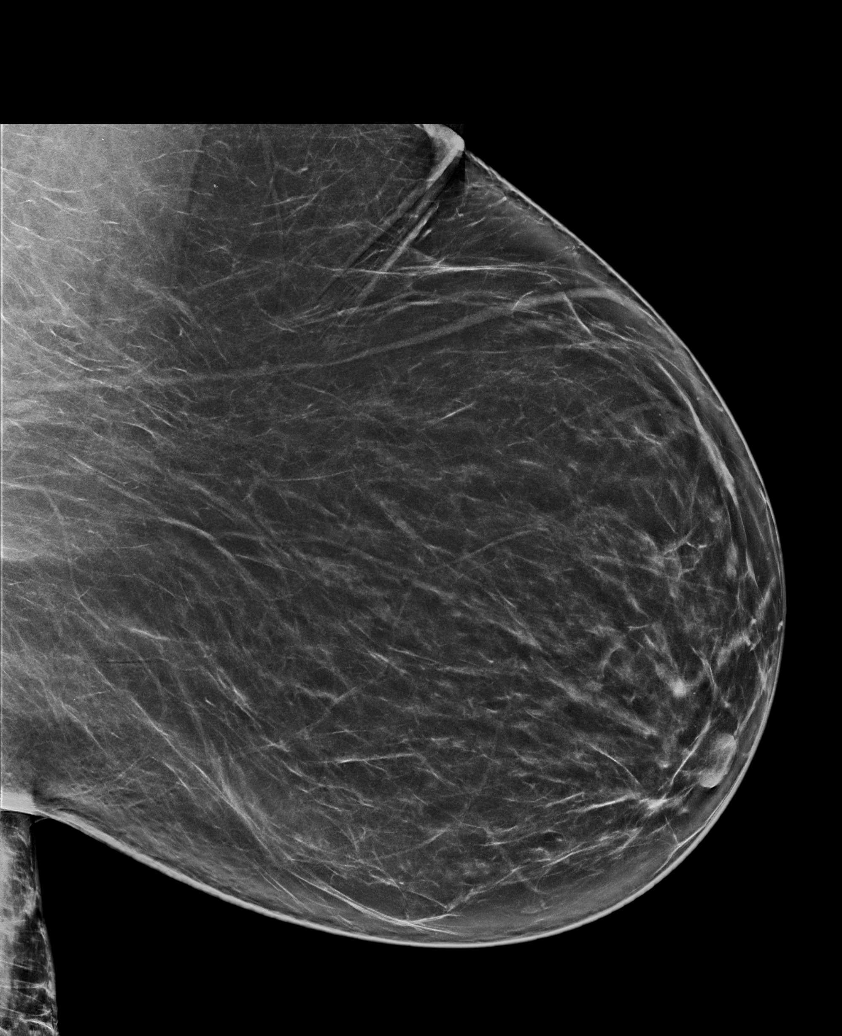

[R ML synth-2D]
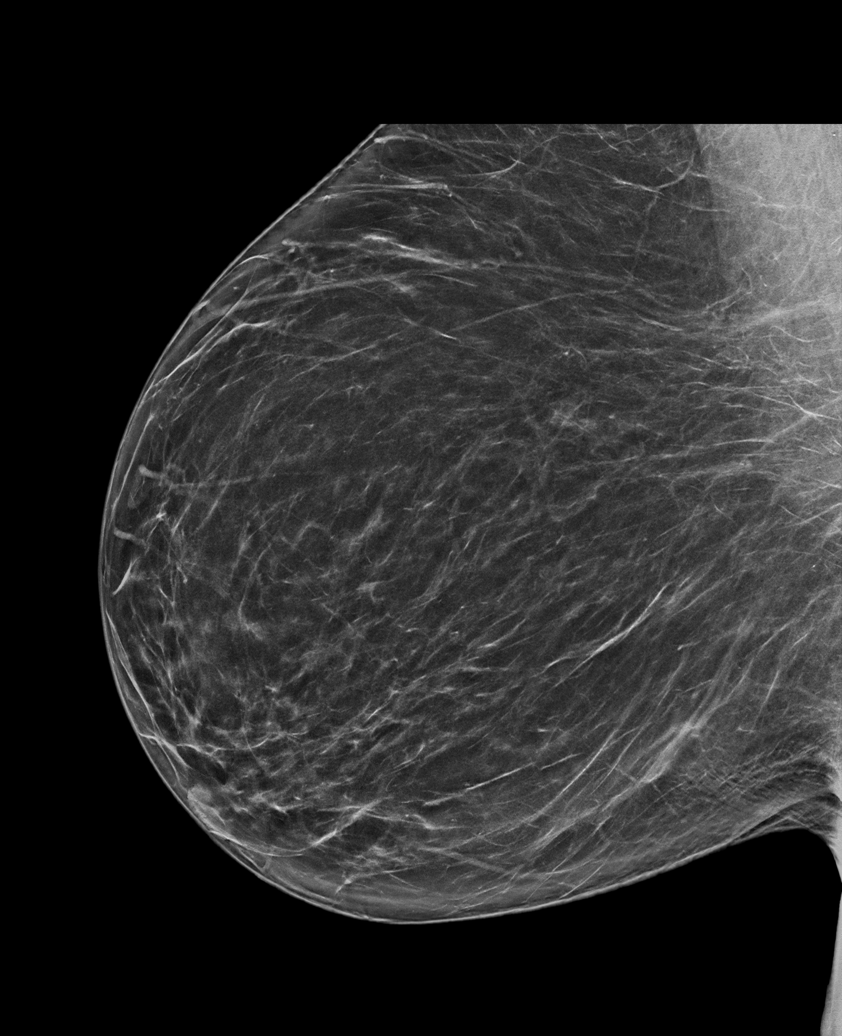

[L CC synth-2D]
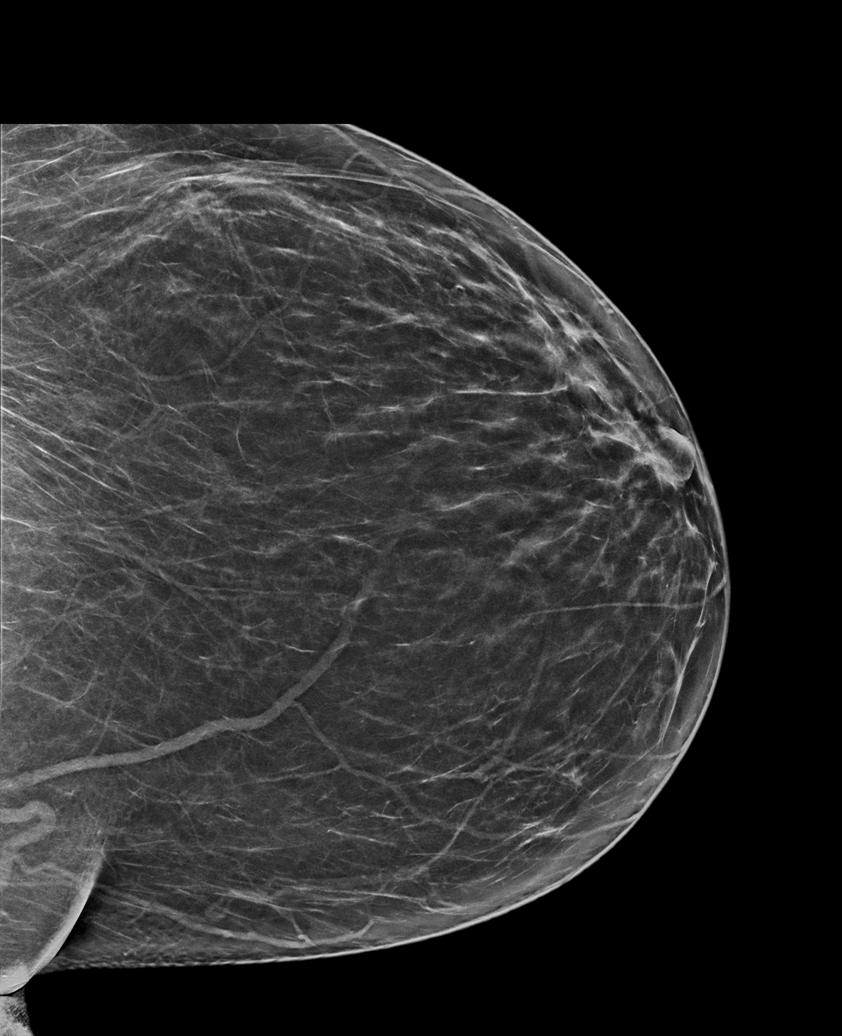

[R MLO synth-2D]
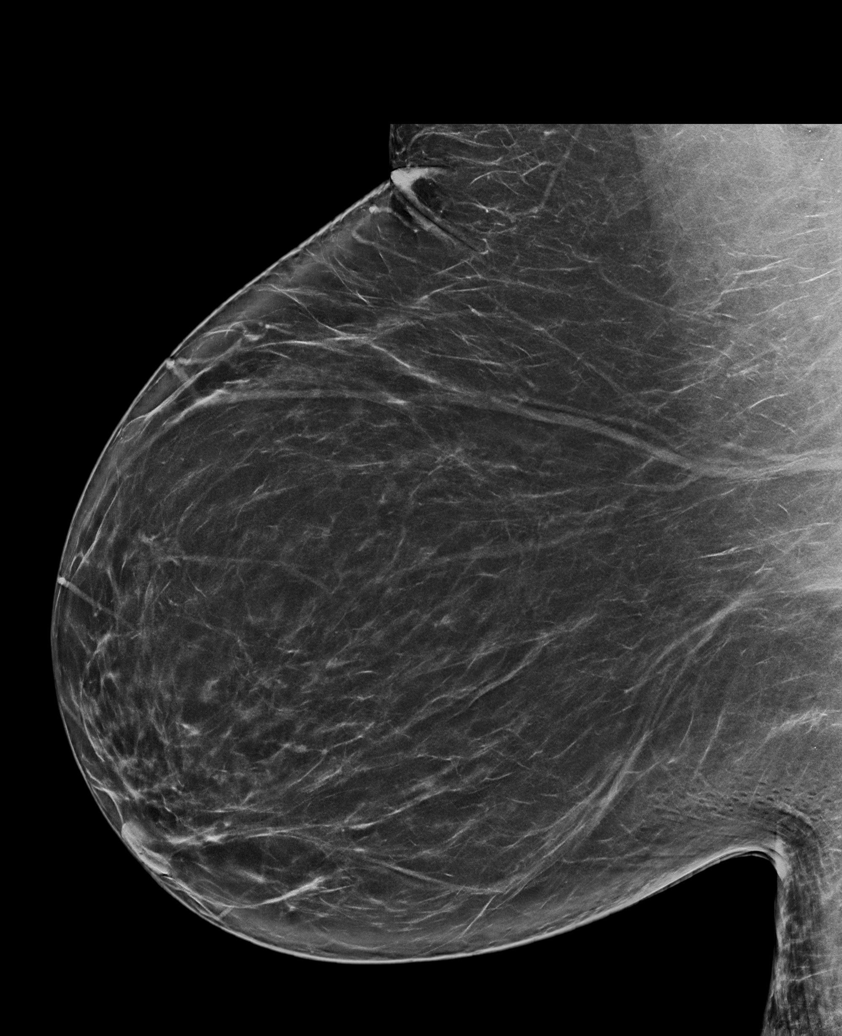

[R CC synth-2D (2 of 2)]
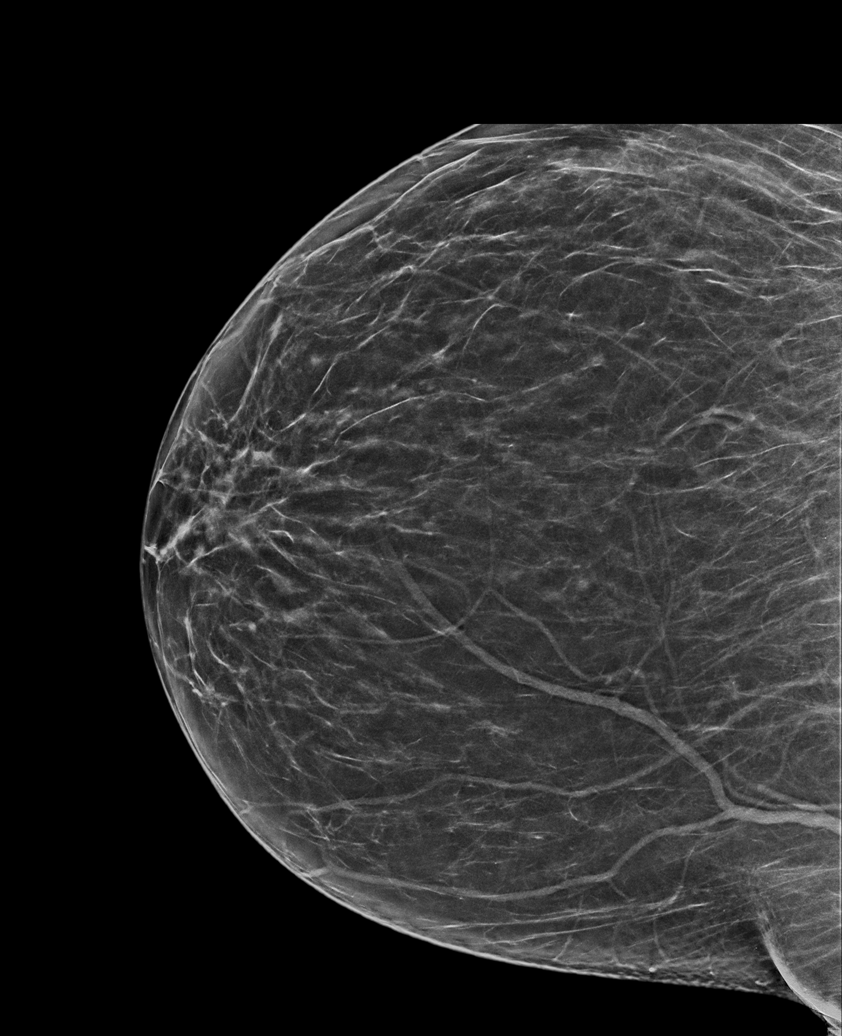

[6 of 36 positions shown; findings below may reference images not displayed]

ACR Breast Density Category b: There are scattered areas of
fibroglandular density.
FINDINGS: There are no dominant masses, suspicious calcifications or secondary
signs of malignancy within either breast. Specifically, there is no
mammographic abnormality within the inferior LEFT breast
corresponding to the area of clinical concern.

Mammographic images were processed with CAD.

On physical exam, there is no circumscribed mass identified within
the lower LEFT breast today.

Targeted ultrasound is performed, evaluating the entire lower LEFT
breast with particular attention to the 6-8 o'clock axis as directed
by the patient, showing only normal fibroglandular tissues and fat
lobules. No solid or cystic mass.
IMPRESSION: No evidence of malignancy within either breast. Specifically, no
mammographic or sonographic evidence of malignancy within the lower
LEFT breast.

RECOMMENDATION:
1. Screening mammogram at age 41 unless there are persistent or
intervening clinical concerns. (Code:VQ-B-VR2)
2. Clinical follow-up recommended for the palpable/painful area of
concern in the LEFT breast. Any further workup should be based on
clinical grounds. The patient was instructed to return sooner if the
area that she feels becomes larger and/or firmer to palpation, or if
a new palpable abnormality is identified in either breast.
3. Benign causes of breast pain, and possible remedies, were
discussed with the patient.

I have discussed the findings and recommendations with the patient.
Results were also provided in writing at the conclusion of the
visit. If applicable, a reminder letter will be sent to the patient
regarding the next appointment.

BI-RADS CATEGORY  1: Negative.

## 2021-05-13 ENCOUNTER — Telehealth: Payer: BC Managed Care – PPO | Admitting: Physician Assistant

## 2021-05-13 DIAGNOSIS — B373 Candidiasis of vulva and vagina: Secondary | ICD-10-CM

## 2021-05-13 MED ORDER — FLUCONAZOLE 150 MG PO TABS: 150.0000 mg | ORAL_TABLET | Freq: Once | ORAL | 0 refills | Status: AC

## 2021-05-13 MED ORDER — FLUCONAZOLE 150 MG PO TABS: 150.0000 mg | ORAL_TABLET | Freq: Once | ORAL | 0 refills | Status: DC

## 2021-05-13 NOTE — Progress Notes (Signed)
Virtual Visit Consent   Carrie Shaw, you are scheduled for a virtual visit with a [facility] provider today.     Just as with appointments in the office, your consent must be obtained to participate.  Your consent will be active for this visit and any virtual visit you may have with one of our providers in the next 365 days.     If you have a MyChart account, a copy of this consent can be sent to you electronically.  All virtual visits are billed to your insurance company just like a traditional visit in the office.    As this is a virtual visit, video technology does not allow for your provider to perform a traditional examination.  This may limit your provider's ability to fully assess your condition.  If your provider identifies any concerns that need to be evaluated in person or the need to arrange testing (such as labs, EKG, etc.), we will make arrangements to do so.     Although advances in technology are sophisticated, we cannot ensure that it will always work on either your end or our end.  If the connection with a video visit is poor, the visit may have to be switched to a telephone visit.  With either a video or telephone visit, we are not always able to ensure that we have a secure connection.     I need to obtain your verbal consent now.   Are you willing to proceed with your visit today?    Lauralye Tyjai Charbonnet has provided verbal consent on 05/13/2021 for a virtual visit (video or telephone).   Carrie Shaw, [place]   Date: 05/13/2021 2:30 PM   Virtual Visit via Video Note   I, Carrie Shaw, connected with  Uchenna Seufert  ([id number], [birth year]) on 05/13/21 at  2:15 PM EDT by a video-enabled telemedicine application and verified that I am speaking with the correct person using two identifiers.  Location: Patient: Virtual Visit Location Patient: Home Provider: Virtual Visit Location Provider: Home Office   I discussed the limitations of  evaluation and management by telemedicine and the availability of in person appointments. The patient expressed understanding and agreed to proceed.    History of Present Illness: Carrie Shaw is a 35 y.o. who identifies as a female who was assigned female at birth, and is being seen today for possible yeast infection. Patient endorses 1.5 days of vaginal itching and white discharge after being on an antibiotic for dental infection. Denies vaginal pain, dysuria, hematuria. LMP ended 1 week ago. No concerns of pregnancy or STI.   HPI: HPI  Problems:  Patient Active Problem List   Diagnosis Date Noted   Essential hypertension 08/29/2017   Intraoperative bladder injury 08/17/2017   Pelvic peritoneal adhesions, female 08/17/2017   Ectopic pregnancy without intrauterine pregnancy 08/16/2017   Morbid obesity (HCC) 04/23/2013    Allergies: No Known Allergies Medications:  Current Outpatient Medications:    Multiple Vitamins-Minerals (MULTIVITAMIN WITH MINERALS) tablet, Take 1 tablet by mouth daily., Disp: , Rfl:   Observations/Objective: Patient is well-developed, well-nourished in no acute distress.  Resting comfortably at home.  Head is normocephalic, atraumatic.  No labored breathing. Speech is clear and coherent with logical content.  Patient is alert and oriented at baseline.   Assessment and Plan: 1. Yeast vaginitis Antibiotic induced. Start Diflucan 150 mg once. May repeat in 3 days. Supportive measures reviewed.   Follow Up Instructions: I discussed the assessment  and treatment plan with the patient. The patient was provided an opportunity to ask questions and all were answered. The patient agreed with the plan and demonstrated an understanding of the instructions.  A copy of instructions were sent to the patient via MyChart.  The patient was advised to call back or seek an in-person evaluation if the symptoms worsen or if the condition fails to improve as  anticipated.  Time:  I spent 12 minutes with the patient via telehealth technology discussing the above problems/concerns.    Carrie Climes, PA-C

## 2021-05-13 NOTE — Patient Instructions (Signed)
  Shariyah Patsy [place], thank you for joining [name], PA-C for today's virtual visit.  While this provider is not your primary care provider (PCP), if your PCP is located in our provider database this encounter information will be shared with them immediately following your visit.  Consent: (Patient) Carrie Shaw provided verbal consent for this virtual visit at the beginning of the encounter.  Current Medications:  Current Outpatient Medications:    fluconazole (DIFLUCAN) 150 MG tablet, Take 1 tablet (150 mg total) by mouth once for 1 dose., Disp: 1 tablet, Rfl: 0   Multiple Vitamins-Minerals (MULTIVITAMIN WITH MINERALS) tablet, Take 1 tablet by mouth daily., Disp: , Rfl:    Medications ordered in this encounter:  Meds ordered this encounter  Medications   fluconazole (DIFLUCAN) 150 MG tablet    Sig: Take 1 tablet (150 mg total) by mouth once for 1 dose.    Dispense:  1 tablet    Refill:  0    Order Specific Question:   Supervising Provider    Answer:   Hyacinth Meeker, BRIAN [[id number]]     *If you need refills on other medications prior to your next appointment, please contact your pharmacy*  Follow-Up: Call back or seek an in-person evaluation if the symptoms worsen or if the condition fails to improve as anticipated.  Other Instructions Take the diflucan as directed. Start a daily probiotic.  Keep hydrated.  If symptoms are not resolving, you will need an in-person assessment.    If you have been instructed to have an in-person evaluation today at a local Urgent Care facility, please use the link below. It will take you to a list of all of our available [facility], including address, phone number and hours of operation. Please do not delay care.  [facility]  If you or a family member do not have a primary care provider, use the link below to schedule a visit and establish care. When you choose a [facility] primary care physician or  advanced practice provider, you gain a long-term partner in health. Find a Primary Care Provider  Learn more about [facility] in-office and virtual care options: [facility] - Get Care Now

## 2021-07-28 ENCOUNTER — Encounter (HOSPITAL_COMMUNITY): Payer: Self-pay | Admitting: *Deleted

## 2021-08-09 DIAGNOSIS — Z9884 Bariatric surgery status: Secondary | ICD-10-CM | POA: Diagnosis not present

## 2021-08-09 DIAGNOSIS — E669 Obesity, unspecified: Secondary | ICD-10-CM | POA: Diagnosis not present

## 2021-08-16 DIAGNOSIS — Z23 Encounter for immunization: Secondary | ICD-10-CM | POA: Diagnosis not present

## 2021-08-16 DIAGNOSIS — E538 Deficiency of other specified B group vitamins: Secondary | ICD-10-CM | POA: Diagnosis not present

## 2021-08-16 DIAGNOSIS — Z9884 Bariatric surgery status: Secondary | ICD-10-CM | POA: Diagnosis not present

## 2021-08-16 DIAGNOSIS — E559 Vitamin D deficiency, unspecified: Secondary | ICD-10-CM | POA: Diagnosis not present

## 2021-08-16 DIAGNOSIS — F332 Major depressive disorder, recurrent severe without psychotic features: Secondary | ICD-10-CM | POA: Diagnosis not present

## 2021-08-16 DIAGNOSIS — I10 Essential (primary) hypertension: Secondary | ICD-10-CM | POA: Diagnosis not present

## 2021-10-02 NOTE — L&D Delivery Note (Signed)
Delivery Note At 1:25 AM a viable and healthy female was delivered via Vaginal, Spontaneous (Presentation:  vtx. Right Occiput Anterior).  APGAR: 9, 9; weight pending .   Placenta status: Spontaneous, Intact.  Cord: 3 vessels with the following complications: Short.  Cord pH: n/a  Anesthesia: Epidural Episiotomy: None Lacerations: None Suture Repair:  n/a Est. Blood Loss (mL): 106  Mom to postpartum.  Baby to Couplet care / Skin to Skin.  Nicolae Vasek A Jeryl Wilbourn 09/18/2022, 1:44 AM

## 2021-12-26 DIAGNOSIS — N898 Other specified noninflammatory disorders of vagina: Secondary | ICD-10-CM | POA: Diagnosis not present

## 2021-12-27 DIAGNOSIS — E538 Deficiency of other specified B group vitamins: Secondary | ICD-10-CM | POA: Diagnosis not present

## 2022-01-23 DIAGNOSIS — N912 Amenorrhea, unspecified: Secondary | ICD-10-CM | POA: Diagnosis not present

## 2022-01-25 DIAGNOSIS — N912 Amenorrhea, unspecified: Secondary | ICD-10-CM | POA: Diagnosis not present

## 2022-01-27 DIAGNOSIS — E538 Deficiency of other specified B group vitamins: Secondary | ICD-10-CM | POA: Diagnosis not present

## 2022-02-08 DIAGNOSIS — Z9889 Other specified postprocedural states: Secondary | ICD-10-CM | POA: Diagnosis not present

## 2022-02-08 DIAGNOSIS — Z3200 Encounter for pregnancy test, result unknown: Secondary | ICD-10-CM | POA: Diagnosis not present

## 2022-02-24 DIAGNOSIS — E559 Vitamin D deficiency, unspecified: Secondary | ICD-10-CM | POA: Diagnosis not present

## 2022-02-28 DIAGNOSIS — O09521 Supervision of elderly multigravida, first trimester: Secondary | ICD-10-CM | POA: Diagnosis not present

## 2022-03-08 DIAGNOSIS — Z8759 Personal history of other complications of pregnancy, childbirth and the puerperium: Secondary | ICD-10-CM | POA: Diagnosis not present

## 2022-03-08 DIAGNOSIS — Z9889 Other specified postprocedural states: Secondary | ICD-10-CM | POA: Diagnosis not present

## 2022-03-08 DIAGNOSIS — O09521 Supervision of elderly multigravida, first trimester: Secondary | ICD-10-CM | POA: Diagnosis not present

## 2022-03-08 DIAGNOSIS — O99841 Bariatric surgery status complicating pregnancy, first trimester: Secondary | ICD-10-CM | POA: Diagnosis not present

## 2022-03-08 DIAGNOSIS — O09529 Supervision of elderly multigravida, unspecified trimester: Secondary | ICD-10-CM | POA: Diagnosis not present

## 2022-03-08 DIAGNOSIS — O99011 Anemia complicating pregnancy, first trimester: Secondary | ICD-10-CM | POA: Diagnosis not present

## 2022-03-08 LAB — OB RESULTS CONSOLE HIV ANTIBODY (ROUTINE TESTING): HIV: NONREACTIVE

## 2022-03-08 LAB — HEPATITIS C ANTIBODY: HCV Ab: NEGATIVE

## 2022-03-08 LAB — OB RESULTS CONSOLE HEPATITIS B SURFACE ANTIGEN: Hepatitis B Surface Ag: NEGATIVE

## 2022-03-08 LAB — OB RESULTS CONSOLE RUBELLA ANTIBODY, IGM: Rubella: IMMUNE

## 2022-03-08 LAB — OB RESULTS CONSOLE RPR: RPR: NONREACTIVE

## 2022-03-13 ENCOUNTER — Telehealth: Payer: Self-pay | Admitting: Hematology and Oncology

## 2022-03-13 LAB — OB RESULTS CONSOLE GC/CHLAMYDIA
Chlamydia: NEGATIVE
Neisseria Gonorrhea: NEGATIVE

## 2022-03-13 NOTE — Telephone Encounter (Signed)
Scheduled appt per 6/12 referral. Pt is aware of appt date and time. Pt is aware to arrive 15 mins prior to appt time and to bring and updated insurance card. Pt is aware of appt location.   

## 2022-03-23 ENCOUNTER — Other Ambulatory Visit: Payer: Self-pay | Admitting: Obstetrics and Gynecology

## 2022-03-23 DIAGNOSIS — Z363 Encounter for antenatal screening for malformations: Secondary | ICD-10-CM

## 2022-03-24 DIAGNOSIS — E538 Deficiency of other specified B group vitamins: Secondary | ICD-10-CM | POA: Diagnosis not present

## 2022-03-29 ENCOUNTER — Encounter: Payer: Self-pay | Admitting: Hematology and Oncology

## 2022-03-29 ENCOUNTER — Inpatient Hospital Stay: Payer: BC Managed Care – PPO

## 2022-03-29 ENCOUNTER — Inpatient Hospital Stay: Payer: BC Managed Care – PPO | Attending: Hematology and Oncology | Admitting: Hematology and Oncology

## 2022-03-29 ENCOUNTER — Other Ambulatory Visit: Payer: Self-pay

## 2022-03-29 VITALS — BP 144/98 | HR 96 | Temp 98.1°F | Resp 16 | Ht 71.0 in | Wt 291.3 lb

## 2022-03-29 DIAGNOSIS — Z9884 Bariatric surgery status: Secondary | ICD-10-CM

## 2022-03-29 DIAGNOSIS — D509 Iron deficiency anemia, unspecified: Secondary | ICD-10-CM

## 2022-03-29 DIAGNOSIS — R5383 Other fatigue: Secondary | ICD-10-CM | POA: Insufficient documentation

## 2022-03-29 DIAGNOSIS — Z87891 Personal history of nicotine dependence: Secondary | ICD-10-CM | POA: Diagnosis not present

## 2022-03-29 DIAGNOSIS — D508 Other iron deficiency anemias: Secondary | ICD-10-CM | POA: Insufficient documentation

## 2022-03-29 DIAGNOSIS — Z8759 Personal history of other complications of pregnancy, childbirth and the puerperium: Secondary | ICD-10-CM | POA: Insufficient documentation

## 2022-03-29 DIAGNOSIS — O99011 Anemia complicating pregnancy, first trimester: Secondary | ICD-10-CM | POA: Diagnosis not present

## 2022-03-29 DIAGNOSIS — Z3A13 13 weeks gestation of pregnancy: Secondary | ICD-10-CM | POA: Insufficient documentation

## 2022-03-29 LAB — IRON AND IRON BINDING CAPACITY (CC-WL,HP ONLY)
Iron: 19 ug/dL — ABNORMAL LOW (ref 28–170)
Saturation Ratios: 4 % — ABNORMAL LOW (ref 10.4–31.8)
TIBC: 521 ug/dL — ABNORMAL HIGH (ref 250–450)
UIBC: 502 ug/dL

## 2022-03-29 LAB — CBC WITH DIFFERENTIAL/PLATELET
Abs Immature Granulocytes: 0.03 10*3/uL (ref 0.00–0.07)
Basophils Absolute: 0 10*3/uL (ref 0.0–0.1)
Basophils Relative: 0 %
Eosinophils Absolute: 0.2 10*3/uL (ref 0.0–0.5)
Eosinophils Relative: 1 %
HCT: 27.7 % — ABNORMAL LOW (ref 36.0–46.0)
Hemoglobin: 8.5 g/dL — ABNORMAL LOW (ref 12.0–15.0)
Immature Granulocytes: 0 %
Lymphocytes Relative: 21 %
Lymphs Abs: 2.3 10*3/uL (ref 0.7–4.0)
MCH: 23.4 pg — ABNORMAL LOW (ref 26.0–34.0)
MCHC: 30.7 g/dL (ref 30.0–36.0)
MCV: 76.3 fL — ABNORMAL LOW (ref 80.0–100.0)
Monocytes Absolute: 0.6 10*3/uL (ref 0.1–1.0)
Monocytes Relative: 6 %
Neutro Abs: 7.9 10*3/uL — ABNORMAL HIGH (ref 1.7–7.7)
Neutrophils Relative %: 72 %
Platelets: 376 10*3/uL (ref 150–400)
RBC: 3.63 MIL/uL — ABNORMAL LOW (ref 3.87–5.11)
RDW: 18.4 % — ABNORMAL HIGH (ref 11.5–15.5)
WBC: 11 10*3/uL — ABNORMAL HIGH (ref 4.0–10.5)
nRBC: 0 % (ref 0.0–0.2)

## 2022-03-29 NOTE — Assessment & Plan Note (Signed)
This is a very pleasant 36 year old female patient, currently at 13 weeks of gestation referred to hematology for evaluation and recommendations regarding iron deficiency anemia. She had 2 prior pregnancies, her children are currently 21 and 11.  After her 2 pregnancies she underwent gastric bypass surgery about 5 to 6 years ago and had been on iron supplementation up until a year ago.  She complains of fatigue, otherwise doing well.  She did not need any IV iron or blood transfusions in the past.  Given that she is still in the first trimester, I recommended that she start on oral iron supplementation ferrous sulfate 65 mg elemental iron twice a day and repeat labs in 4 to 6 weeks.  If there is no improvement in iron deficiency anemia by that time, we can consider intravenous iron.  We have discussed about intravenous iron today.  We have discussed that there are no large control studies of IV iron in pregnancy but retrospective data demonstrates that the iron is relatively safe in pregnancy.  We will consider Venofer if she needs to proceed with intravenous iron. We have discussed about the small chance of anaphylactic reactions with intravenous iron however it generally is very well-tolerated.  She appears to be in agreement with plan.  She will start taking oral iron sulfate twice a day and return to clinic in 6 weeks for repeat labs.  CBC, ferritin and iron panel today ordered. We have noticed that she appears to be mildly hypertensive today.  Given her past history of post eclampsia, have encouraged her to keep an eye on this and report to her OB/GYN.  Thank you for consulting [place] the care of this patient.  Please not hesitate to contact us with any additional questions or concerns.

## 2022-03-29 NOTE — Progress Notes (Signed)
[facility] CONSULT NOTE  Patient Care Team: Aliene Beams, MD as PCP - General (Family Medicine) Maxie Better, MD as PCP - OBGYN (Obstetrics and Gynecology) Gaynelle Adu, MD as Consulting Physician (General Surgery)  CHIEF COMPLAINTS/PURPOSE OF CONSULTATION:  IDA  ASSESSMENT & PLAN:  IDA (iron deficiency anemia) This is a very pleasant 36 year old female patient, currently at 13 weeks of gestation referred to hematology for evaluation and recommendations regarding iron deficiency anemia. She had 2 prior pregnancies, her children are currently 44 and 11.  After her 2 pregnancies she underwent gastric bypass surgery about 5 to 6 years ago and had been on iron supplementation up until a year ago.  She complains of fatigue, otherwise doing well.  She did not need any IV iron or blood transfusions in the past.  Given that she is still in the first trimester, I recommended that she start on oral iron supplementation ferrous sulfate 65 mg elemental iron twice a day and repeat labs in 4 to 6 weeks.  If there is no improvement in iron deficiency anemia by that time, we can consider intravenous iron.  We have discussed about intravenous iron today.  We have discussed that there are no large control studies of IV iron in pregnancy but retrospective data demonstrates that the iron is relatively safe in pregnancy.  We will consider Venofer if she needs to proceed with intravenous iron. We have discussed about the small chance of anaphylactic reactions with intravenous iron however it generally is very well-tolerated.  She appears to be in agreement with plan.  She will start taking oral iron sulfate twice a day and return to clinic in 6 weeks for repeat labs.  CBC, ferritin and iron panel today ordered. We have noticed that she appears to be mildly hypertensive today.  Given her past history of post eclampsia, have encouraged her to keep an eye on this and report to her OB/GYN.  Thank  you for consulting [place] the care of this patient.  Please not hesitate to contact us with any additional questions or concerns.  Orders Placed This Encounter  Procedures   CBC with Differential/Platelet    Standing Status:   Standing    Number of Occurrences:   22    Standing Expiration Date:   03/30/2023   Iron and Iron Binding Capacity ([facility] only)    Standing Status:   Future    Number of Occurrences:   1    Standing Expiration Date:   03/30/2023   Ferritin    Standing Status:   Future    Number of Occurrences:   1    Standing Expiration Date:   03/29/2023     HISTORY OF PRESENTING ILLNESS:  Carrie Shaw 36 y.o. female is here because of IDA  This is a very pleasant 36 year old female patient currently at 13 weeks 4 days of gestation referred to hematology for evaluation and recommendations regarding iron deficiency anemia.  Patient arrived to the appointment today with her Sister [place].  She had 2 prior pregnancies and her kids are 38 and 11.  She had gastric bypass surgery about 6 years ago and had taken iron supplementation until the past 1 year.  She states that iron did not quite make a difference with her fatigue and she is stopped it.  She denies any other issues besides recent diagnosis of anemia and fatigue.  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   She did not have  any history of iron infusions or blood transfusions. She currently takes prenatal vitamins daily, does not take any additional iron supplementation.  She had post eclampsia with her previous pregnancy.   Rest of the pertinent 10 point ROS reviewed and negative  MEDICAL HISTORY:  Past Medical History:  Diagnosis Date   ADD (attention deficit disorder)    Anemia    Anxiety    Back pain    Depression    Gallbladder problem    HSV infection    Hypertension    Joint pain    Lower extremity edema    Obesity    Sleep apnea    mild no mask   SVD (spontaneous vaginal delivery)    x  2   Termination of pregnancy (fetus)    x 2   UTI (urinary tract infection)    Vitamin B12 deficiency    Vitamin D deficiency     SURGICAL HISTORY: Past Surgical History:  Procedure Laterality Date   APPENDECTOMY     CYSTOSCOPY N/A 08/16/2017   Procedure: CYSTOSCOPY;  Surgeon: Conan Bowens, MD;  Location: [facility];  Service: Gynecology;  Laterality: N/A;   GASTRIC ROUX-EN-Y N/A 01/01/2018   Procedure: LAPAROSCOPIC ROUX-EN-Y GASTRIC BYPASS WITH UPPER ENDOSCOPY;  Surgeon: Gaynelle Adu, MD;  Location: [facility];  Service: General;  Laterality: N/A;   LAPAROSCOPY N/A 08/16/2017   Procedure: LAPAROSCOPY DIAGNOSTIC;  Surgeon: Conan Bowens, MD;  Location: [facility];  Service: Gynecology;  Laterality: N/A;   LAPAROTOMY N/A 08/16/2017   Procedure: EXPLORATORY LAPAROTOMY WITH REMOVAL OF LEFT ECTOPIC PREGNANCY, LEFT SALPINGECTOMY AND CYSTOTOMY OF BLADDER;  Surgeon: Conan Bowens, MD;  Location: [facility];  Service: Gynecology;  Laterality: N/A;   LYSIS OF ADHESION N/A 08/16/2017   Procedure: LYSIS OF ADHESION;  Surgeon: Conan Bowens, MD;  Location: [facility];  Service: Gynecology;  Laterality: N/A;   Tummy tuck     10/2019   UNILATERAL SALPINGECTOMY Left 08/16/2017   Procedure: UNILATERAL SALPINGECTOMY;  Surgeon: Conan Bowens, MD;  Location: [facility];  Service: Gynecology;  Laterality: Left;    SOCIAL HISTORY: Social History   Socioeconomic History   Marital status: Single    Spouse name: Fraser Din   Number of children: 2   Years of education: Not on file   Highest education level: Not on file  Occupational History   Not on file  Tobacco Use   Smoking status: Former    Packs/day: 0.25    Years: 10.00    Total pack years: 2.50    Types: Cigarettes    Quit date: 11/28/2017    Years since quitting: 4.3   Smokeless tobacco: Never  Vaping Use   Vaping Use: Never used  Substance and Sexual Activity   Alcohol use: Not Currently    Comment: rare   Drug use: No   Sexual activity: Yes     Partners: Male    Birth control/protection: None  Other Topics Concern   Not on file  Social History Narrative   She works at [employer]    Two children   Not married       She likes to shop and travel    Social Determinants of Corporate investment banker Strain: Not on file  Food Insecurity: Not on file  Transportation Needs: Not on file  Physical Activity: Not on file  Stress: Not on file  Social Connections: Not on file  Intimate Partner Violence: Not on file  FAMILY HISTORY: Family History  Problem Relation Age of Onset   Depression Mother    Hypertension Mother    Anxiety disorder Mother    Sleep apnea Mother    Obesity Mother    Hypertension Father    Depression Father    Anxiety disorder Father    Sleep apnea Father    Alcoholism Father    Drug abuse Father    Obesity Father    Schizophrenia Sister    Bipolar disorder Sister     ALLERGIES:  has No Known Allergies.  MEDICATIONS:  Current Outpatient Medications  Medication Sig Dispense Refill   Multiple Vitamins-Minerals (MULTIVITAMIN WITH MINERALS) tablet Take 1 tablet by mouth daily.     No current facility-administered medications for this visit.     PHYSICAL EXAMINATION: ECOG PERFORMANCE STATUS: 0 - Asymptomatic  Vitals:   03/29/22 1413  BP: (!) 144/98  Pulse: 96  Resp: 16  Temp: 98.1 F (36.7 C)  SpO2: 99%   Filed Weights   03/29/22 1413  Weight: 291 lb 4.8 oz (132.1 kg)    Physical Exam Constitutional:      Appearance: Normal appearance.  Cardiovascular:     Rate and Rhythm: Normal rate and regular rhythm.     Pulses: Normal pulses.     Heart sounds: Normal heart sounds.  Pulmonary:     Effort: Pulmonary effort is normal.     Breath sounds: Normal breath sounds.  Abdominal:     General: Abdomen is flat. Bowel sounds are normal. There is no distension.     Palpations: Abdomen is soft. There is no mass.  Musculoskeletal:        General: No swelling.     Cervical back: Normal  range of motion and neck supple. No rigidity.  Lymphadenopathy:     Cervical: No cervical adenopathy.  Skin:    General: Skin is warm and dry.  Neurological:     General: No focal deficit present.     Mental Status: She is alert.  Psychiatric:        Mood and Affect: Mood normal.      LABORATORY DATA:  I have reviewed the data as listed Lab Results  Component Value Date   WBC 5.9 01/06/2020   HGB 10.3 (L) 01/06/2020   HCT 33.5 (L) 01/06/2020   MCV 88 01/06/2020   PLT 366 01/06/2020     Chemistry      Component Value Date/Time   NA 141 01/06/2020 1103   K 4.5 01/06/2020 1103   CL 109 (H) 01/06/2020 1103   CO2 20 01/06/2020 1103   BUN 9 01/06/2020 1103   CREATININE 0.62 01/06/2020 1103   CREATININE 0.72 05/17/2016 1155      Component Value Date/Time   CALCIUM 8.6 (L) 01/06/2020 1103   ALKPHOS 67 01/06/2020 1103   AST 23 01/06/2020 1103   ALT 31 01/06/2020 1103   BILITOT 0.3 01/06/2020 1103       RADIOGRAPHIC STUDIES: I have personally reviewed the radiological images as listed and agreed with the findings in the report. No results found.  All questions were answered. The patient knows to call the clinic with any problems, questions or concerns. I spent 45 minutes in the care of this patient including H and P, review of records, counseling and coordination of care.     Rachel Moulds, MD 03/29/2022 2:54 PM

## 2022-03-30 LAB — FERRITIN: Ferritin: 6 ng/mL — ABNORMAL LOW (ref 11–307)

## 2022-04-06 DIAGNOSIS — O09512 Supervision of elderly primigravida, second trimester: Secondary | ICD-10-CM | POA: Diagnosis not present

## 2022-04-06 DIAGNOSIS — O99842 Bariatric surgery status complicating pregnancy, second trimester: Secondary | ICD-10-CM | POA: Diagnosis not present

## 2022-04-06 DIAGNOSIS — O09299 Supervision of pregnancy with other poor reproductive or obstetric history, unspecified trimester: Secondary | ICD-10-CM | POA: Diagnosis not present

## 2022-04-06 DIAGNOSIS — O99212 Obesity complicating pregnancy, second trimester: Secondary | ICD-10-CM | POA: Diagnosis not present

## 2022-04-21 DIAGNOSIS — O09529 Supervision of elderly multigravida, unspecified trimester: Secondary | ICD-10-CM | POA: Diagnosis not present

## 2022-04-21 DIAGNOSIS — Z361 Encounter for antenatal screening for raised alphafetoprotein level: Secondary | ICD-10-CM | POA: Diagnosis not present

## 2022-04-21 DIAGNOSIS — E538 Deficiency of other specified B group vitamins: Secondary | ICD-10-CM | POA: Diagnosis not present

## 2022-05-03 DIAGNOSIS — Z3689 Encounter for other specified antenatal screening: Secondary | ICD-10-CM | POA: Diagnosis not present

## 2022-05-03 DIAGNOSIS — O09529 Supervision of elderly multigravida, unspecified trimester: Secondary | ICD-10-CM | POA: Diagnosis not present

## 2022-05-03 DIAGNOSIS — O09522 Supervision of elderly multigravida, second trimester: Secondary | ICD-10-CM | POA: Diagnosis not present

## 2022-05-03 DIAGNOSIS — O09299 Supervision of pregnancy with other poor reproductive or obstetric history, unspecified trimester: Secondary | ICD-10-CM | POA: Diagnosis not present

## 2022-05-03 DIAGNOSIS — O99012 Anemia complicating pregnancy, second trimester: Secondary | ICD-10-CM | POA: Diagnosis not present

## 2022-05-04 ENCOUNTER — Encounter: Payer: Self-pay | Admitting: *Deleted

## 2022-05-08 ENCOUNTER — Encounter: Payer: Self-pay | Admitting: *Deleted

## 2022-05-08 ENCOUNTER — Ambulatory Visit: Payer: BC Managed Care – PPO | Admitting: *Deleted

## 2022-05-08 ENCOUNTER — Ambulatory Visit: Payer: BC Managed Care – PPO | Attending: Obstetrics and Gynecology

## 2022-05-08 ENCOUNTER — Other Ambulatory Visit: Payer: Self-pay | Admitting: *Deleted

## 2022-05-08 VITALS — BP 125/69 | HR 89

## 2022-05-08 DIAGNOSIS — O09522 Supervision of elderly multigravida, second trimester: Secondary | ICD-10-CM | POA: Insufficient documentation

## 2022-05-08 DIAGNOSIS — Z363 Encounter for antenatal screening for malformations: Secondary | ICD-10-CM | POA: Diagnosis not present

## 2022-05-08 DIAGNOSIS — R638 Other symptoms and signs concerning food and fluid intake: Secondary | ICD-10-CM

## 2022-05-09 ENCOUNTER — Other Ambulatory Visit: Payer: Self-pay

## 2022-05-09 ENCOUNTER — Inpatient Hospital Stay: Payer: BC Managed Care – PPO | Attending: Hematology and Oncology

## 2022-05-09 ENCOUNTER — Encounter: Payer: Self-pay | Admitting: Hematology and Oncology

## 2022-05-09 ENCOUNTER — Inpatient Hospital Stay (HOSPITAL_BASED_OUTPATIENT_CLINIC_OR_DEPARTMENT_OTHER): Payer: BC Managed Care – PPO | Admitting: Hematology and Oncology

## 2022-05-09 DIAGNOSIS — Z3A13 13 weeks gestation of pregnancy: Secondary | ICD-10-CM | POA: Diagnosis not present

## 2022-05-09 DIAGNOSIS — O99012 Anemia complicating pregnancy, second trimester: Secondary | ICD-10-CM | POA: Insufficient documentation

## 2022-05-09 DIAGNOSIS — Z8759 Personal history of other complications of pregnancy, childbirth and the puerperium: Secondary | ICD-10-CM | POA: Insufficient documentation

## 2022-05-09 DIAGNOSIS — Z9884 Bariatric surgery status: Secondary | ICD-10-CM | POA: Diagnosis not present

## 2022-05-09 DIAGNOSIS — D509 Iron deficiency anemia, unspecified: Secondary | ICD-10-CM | POA: Diagnosis not present

## 2022-05-09 DIAGNOSIS — D508 Other iron deficiency anemias: Secondary | ICD-10-CM | POA: Diagnosis not present

## 2022-05-09 DIAGNOSIS — O99011 Anemia complicating pregnancy, first trimester: Secondary | ICD-10-CM | POA: Diagnosis not present

## 2022-05-09 LAB — CBC WITH DIFFERENTIAL/PLATELET
Abs Immature Granulocytes: 0.06 10*3/uL (ref 0.00–0.07)
Basophils Absolute: 0 10*3/uL (ref 0.0–0.1)
Basophils Relative: 0 %
Eosinophils Absolute: 0.2 10*3/uL (ref 0.0–0.5)
Eosinophils Relative: 2 %
HCT: 25.5 % — ABNORMAL LOW (ref 36.0–46.0)
Hemoglobin: 7.9 g/dL — ABNORMAL LOW (ref 12.0–15.0)
Immature Granulocytes: 1 %
Lymphocytes Relative: 19 %
Lymphs Abs: 2.3 10*3/uL (ref 0.7–4.0)
MCH: 23.3 pg — ABNORMAL LOW (ref 26.0–34.0)
MCHC: 31 g/dL (ref 30.0–36.0)
MCV: 75.2 fL — ABNORMAL LOW (ref 80.0–100.0)
Monocytes Absolute: 0.9 10*3/uL (ref 0.1–1.0)
Monocytes Relative: 8 %
Neutro Abs: 8.3 10*3/uL — ABNORMAL HIGH (ref 1.7–7.7)
Neutrophils Relative %: 70 %
Platelets: 348 10*3/uL (ref 150–400)
RBC: 3.39 MIL/uL — ABNORMAL LOW (ref 3.87–5.11)
RDW: 18.9 % — ABNORMAL HIGH (ref 11.5–15.5)
WBC: 11.8 10*3/uL — ABNORMAL HIGH (ref 4.0–10.5)
nRBC: 0 % (ref 0.0–0.2)

## 2022-05-09 NOTE — Assessment & Plan Note (Signed)
This is a very pleasant 36 year old female patient, currently at 13 weeks of gestation referred to hematology for evaluation and recommendations regarding iron deficiency anemia. She had 2 prior pregnancies, her children are currently 80 and 11.  After her 2 pregnancies she underwent gastric bypass surgery about 5 to 6 years ago and had been on iron supplementation up until a year ago. She was seen in her first trimester with iron deficiency anemia and recommendation was to try oral iron since she was reluctant to take IV iron.  She is here for follow-up.  She has been taking oral iron as recommended, twice a day.  She has not noticed any worsening fatigue.  CBC however today shows worsening anemia hemoglobin is 7.9.  We once again discussed about IV iron and she is agreeable to it.  We have discussed that there are no large prospective studies demonstrating the safety of IV iron however there is retrospective literature demonstrating safety of IV iron in pregnancy.  We will proceed with IV iron twice a week for 2 weeks.  She understands that if she has a life-threatening anaphylactic reaction, we may have to give her medication that may not be necessarily safe for the baby.  She understands that this is very rare.  Most common side effects are headaches and body aches.  She will return to clinic in 3 months.

## 2022-05-09 NOTE — Progress Notes (Signed)
[facility] CONSULT NOTE  Patient Care Team: Aliene Beams, MD as PCP - General (Family Medicine) Maxie Better, MD as PCP - OBGYN (Obstetrics and Gynecology) Gaynelle Adu, MD as Consulting Physician (General Surgery)  CHIEF COMPLAINTS/PURPOSE OF CONSULTATION:  IDA  ASSESSMENT & PLAN:   IDA (iron deficiency anemia) This is a very pleasant 36 year old female patient, currently at 13 weeks of gestation referred to hematology for evaluation and recommendations regarding iron deficiency anemia. She had 2 prior pregnancies, her children are currently 27 and 11.  After her 2 pregnancies she underwent gastric bypass surgery about 5 to 6 years ago and had been on iron supplementation up until a year ago. She was seen in her first trimester with iron deficiency anemia and recommendation was to try oral iron since she was reluctant to take IV iron.  She is here for follow-up.  She has been taking oral iron as recommended, twice a day.  She has not noticed any worsening fatigue.  CBC however today shows worsening anemia hemoglobin is 7.9.  We once again discussed about IV iron and she is agreeable to it.  We have discussed that there are no large prospective studies demonstrating the safety of IV iron however there is retrospective literature demonstrating safety of IV iron in pregnancy.  We will proceed with IV iron twice a week for 2 weeks.  She understands that if she has a life-threatening anaphylactic reaction, we may have to give her medication that may not be necessarily safe for the baby.  She understands that this is very rare.  Most common side effects are headaches and body aches.  She will return to clinic in 3 months.  No orders of the defined types were placed in this encounter.    HISTORY OF PRESENTING ILLNESS:   Carrie Shaw 36 y.o. female is here because of IDA  This is a very pleasant 36 year old female patient currently referred to [place] for  evaluation of anemia. During her last visit, we have discussed about trial of oral iron and to return for follow-up. She has been taking oral iron twice a day for the past few weeks, no complications reported.  She is currently 19-1/[redacted] weeks pregnant and is doing well from pregnancy standpoint.  No bleeding reported. Rest of the pertinent 10 point ROS reviewed and negative  MEDICAL HISTORY:  Past Medical History:  Diagnosis Date   ADD (attention deficit disorder)    Anemia    Anxiety    Back pain    Depression    Gallbladder problem    HSV infection    Hypertension    Joint pain    Lower extremity edema    Obesity    Sleep apnea    mild no mask   SVD (spontaneous vaginal delivery)    x 2   Termination of pregnancy (fetus)    x 2   UTI (urinary tract infection)    Vitamin B12 deficiency    Vitamin D deficiency     SURGICAL HISTORY: Past Surgical History:  Procedure Laterality Date   APPENDECTOMY     CYSTOSCOPY N/A 08/16/2017   Procedure: CYSTOSCOPY;  Surgeon: Conan Bowens, MD;  Location: [facility];  Service: Gynecology;  Laterality: N/A;   GASTRIC ROUX-EN-Y N/A 01/01/2018   Procedure: LAPAROSCOPIC ROUX-EN-Y GASTRIC BYPASS WITH UPPER ENDOSCOPY;  Surgeon: Gaynelle Adu, MD;  Location: [facility];  Service: General;  Laterality: N/A;   LAPAROSCOPY N/A 08/16/2017   Procedure: LAPAROSCOPY DIAGNOSTIC;  Surgeon: Sloan Leiter, MD;  Location: [facility];  Service: Gynecology;  Laterality: N/A;   LAPAROTOMY N/A 08/16/2017   Procedure: EXPLORATORY LAPAROTOMY WITH REMOVAL OF LEFT ECTOPIC PREGNANCY, LEFT SALPINGECTOMY AND CYSTOTOMY OF BLADDER;  Surgeon: Sloan Leiter, MD;  Location: [facility];  Service: Gynecology;  Laterality: N/A;   LYSIS OF ADHESION N/A 08/16/2017   Procedure: LYSIS OF ADHESION;  Surgeon: Sloan Leiter, MD;  Location: [facility];  Service: Gynecology;  Laterality: N/A;   Tummy tuck     10/2019   UNILATERAL SALPINGECTOMY Left 08/16/2017   Procedure: UNILATERAL SALPINGECTOMY;   Surgeon: Sloan Leiter, MD;  Location: [facility];  Service: Gynecology;  Laterality: Left;    SOCIAL HISTORY: Social History   Socioeconomic History   Marital status: Single    Spouse name: Jaci Standard   Number of children: 2   Years of education: Not on file   Highest education level: Not on file  Occupational History   Not on file  Tobacco Use   Smoking status: Former    Packs/day: 0.25    Years: 10.00    Total pack years: 2.50    Types: Cigarettes    Quit date: 11/28/2017    Years since quitting: 4.4   Smokeless tobacco: Never  Vaping Use   Vaping Use: Never used  Substance and Sexual Activity   Alcohol use: Not Currently    Comment: rare   Drug use: No   Sexual activity: Yes    Partners: Male    Birth control/protection: None  Other Topics Concern   Not on file  Social History Narrative   She works at [employer]    Two children   Not married       She likes to shop and travel    Social Determinants of Radio broadcast assistant Strain: Not on file  Food Insecurity: Not on file  Transportation Needs: Not on file  Physical Activity: Not on file  Stress: Not on file  Social Connections: Not on file  Intimate Partner Violence: Not on file    FAMILY HISTORY: Family History  Problem Relation Age of Onset   Depression Mother    Hypertension Mother    Anxiety disorder Mother    Sleep apnea Mother    Obesity Mother    Hypertension Father    Depression Father    Anxiety disorder Father    Sleep apnea Father    Alcoholism Father    Drug abuse Father    Obesity Father    Schizophrenia Sister    Bipolar disorder Sister     ALLERGIES:  has No Known Allergies.  MEDICATIONS:  Current Outpatient Medications  Medication Sig Dispense Refill   Multiple Vitamins-Minerals (MULTIVITAMIN WITH MINERALS) tablet Take 1 tablet by mouth daily.     No current facility-administered medications for this visit.     PHYSICAL EXAMINATION: ECOG PERFORMANCE STATUS: 0 -  Asymptomatic  Vitals:   05/09/22 1557  BP: (!) 137/91  Pulse: 75  Resp: 18  Temp: 97.9 F (36.6 C)  SpO2: 100%   Filed Weights   05/09/22 1557  Weight: (!) 302 lb 8 oz (137.2 kg)    Physical Exam Constitutional:      Appearance: Normal appearance.  Cardiovascular:     Rate and Rhythm: Normal rate and regular rhythm.     Pulses: Normal pulses.     Heart sounds: Normal heart sounds.  Pulmonary:     Effort: Pulmonary effort is  normal.     Breath sounds: Normal breath sounds.  Abdominal:     General: Abdomen is flat. Bowel sounds are normal. There is no distension.     Palpations: Abdomen is soft. There is no mass.  Musculoskeletal:        General: No swelling.     Cervical back: Normal range of motion and neck supple. No rigidity.  Lymphadenopathy:     Cervical: No cervical adenopathy.  Skin:    General: Skin is warm and dry.  Neurological:     General: No focal deficit present.     Mental Status: She is alert.  Psychiatric:        Mood and Affect: Mood normal.      LABORATORY DATA:  I have reviewed the data as listed Lab Results  Component Value Date   WBC 11.8 (H) 05/09/2022   HGB 7.9 (L) 05/09/2022   HCT 25.5 (L) 05/09/2022   MCV 75.2 (L) 05/09/2022   PLT 348 05/09/2022     Chemistry      Component Value Date/Time   NA 141 01/06/2020 1103   K 4.5 01/06/2020 1103   CL 109 (H) 01/06/2020 1103   CO2 20 01/06/2020 1103   BUN 9 01/06/2020 1103   CREATININE 0.62 01/06/2020 1103   CREATININE 0.72 05/17/2016 1155      Component Value Date/Time   CALCIUM 8.6 (L) 01/06/2020 1103   ALKPHOS 67 01/06/2020 1103   AST 23 01/06/2020 1103   ALT 31 01/06/2020 1103   BILITOT 0.3 01/06/2020 1103       RADIOGRAPHIC STUDIES: I have personally reviewed the radiological images as listed and agreed with the findings in the report. [place] MFM OB DETAIL +14 WK  Result Date: 05/08/2022 ----------------------------------------------------------------------  OBSTETRICS  REPORT                       (Signed Final 05/08/2022 12:34 pm) ---------------------------------------------------------------------- Patient Info  ID #:       [id number]                          D.O.B.:  1986-01-01 (36 yrs)  Name:       Carrie Shaw                Visit Date: 05/08/2022 10:19 am              [place] ---------------------------------------------------------------------- Performed By  Attending:        Johnell Comings MD         Ref. Address:     [facility]                                                             OB/GYN                                                             [address]                                                             [place]                                                             [place]  Performed By:     Hubert Azure          Location:         [facility]                                     [facility]                                                             [facility] [place] [facility]                                                             [facility]  Referred By:      Alanda Slim                    COUSINS MD ---------------------------------------------------------------------- Orders  #  Description                           Code        Ordered By  1  [place] MFM OB DETAIL +14 [place]               76811.01    SHERONETTE                                                       COUSINS ----------------------------------------------------------------------  #  Order #                     Accession #                Episode #  1  [id number]                   [id number]                 [id number] ---------------------------------------------------------------------- Indications  Advanced maternal age multigravida 40+,        O70.522  second trimester  Obesity complicating pregnancy,  second         O99.212  trimester  Encounter for antenatal screening for          Z36.3   malformations  [redacted] weeks gestation of pregnancy                Z3A.19  Poor obstetric history: Previous               O09.299  preeclampsia / eclampsia/gestational HTN ---------------------------------------------------------------------- Fetal Evaluation  Num Of Fetuses:         1  Fetal Heart Rate(bpm):  158  Cardiac Activity:       Observed  Presentation:           Cephalic  Placenta:               Anterior  P. Cord Insertion:      Visualized, central  Amniotic Fluid  AFI FV:      Within normal limits                              Largest Pocket(cm)                              6.13 ---------------------------------------------------------------------- Biometry  BPD:     45.23  mm     G. Age:  19w 5d         67  %    CI:        73.47   %    70 - 86                                                          FL/HC:      19.3   %    16.1 - 18.3  HC:    167.66   mm     G. Age:  19w 3d         50  %    HC/AC:      1.03        1.09 - 1.39  AC:    162.93   mm     G. Age:  21w 3d         95  %    FL/BPD:     71.4   %  FL:      32.28  mm     G. Age:  20w 0d         71  %    FL/AC:      19.8   %    20 - 24  HUM:      29.2  mm     G. Age:  19w 4d         57  %  Est. FW:     364  gm    0 lb 13 oz      98  % ---------------------------------------------------------------------- OB History  Gravidity:    6         Term:   2  TOP:          2 ---------------------------------------------------------------------- Gestational Age  LMP:           19w 2d  Date:  12/24/21                  EDD:   09/30/22  U/S Today:     20w 1d                                        EDD:   09/24/22  Best:          19w 2d     Det. By:  LMP  (12/24/21)          EDD:   09/30/22 ---------------------------------------------------------------------- Anatomy  Cranium:               Appears normal         LVOT:                   Appears normal  Cavum:                 Appears normal         Aortic Arch:            Appears normal  Ventricles:            Not  well visualized    Ductal Arch:            Not well visualized  Choroid Plexus:        Appears normal         Diaphragm:              Appears normal  Cerebellum:            Appears normal         Stomach:                Appears normal, left                                                                        sided  Posterior Fossa:       Appears normal         Abdomen:                Appears normal  Nuchal Fold:           Appears normal         Abdominal Wall:         Appears nml (cord                                                                        insert, abd wall)  Face:                  Not well visualized    Cord Vessels:           Appears normal (3  vessel cord)  Lips:                  Not well visualized    Kidneys:                Appear normal  Palate:                Not well visualized    Bladder:                Appears normal  Thoracic:              Appears normal         Spine:                  Limited views                                                                        appear normal  Heart:                 Appears normal         Upper Extremities:      Appears normal                         (4CH, axis, and                         situs)  RVOT:                  Not well visualized    Lower Extremities:      Appears normal  Other:  Fetus appears to be female. Nasal bone visualized. Technically          difficult due to maternal habitus. ---------------------------------------------------------------------- Cervix Uterus Adnexa  Cervix  Length:            4.2  cm.  Normal appearance by transabdominal scan.  Uterus  No abnormality visualized.  Right Ovary  Not visualized.  Left Ovary  Not visualized.  Adnexa  No abnormality visualized. ---------------------------------------------------------------------- Comments  This patient was seen for a detailed fetal anatomy scan due  to maternal obesity.  She has a history a prior  gastric bypass  surgery and abdominoplasty.  She denies any problems in her current pregnancy.  She had a cell free DNA test earlier in her pregnancy which  indicated a low risk for trisomy 56, 9, and 13. A female fetus  is predicted.  She was informed that the fetal growth and amniotic fluid  level were appropriate for her gestational age.  The views of the fetal anatomy were limited today due to her  prior abdominoplasty scar.  The patient was advised that the  fetus is right at the level of her prior abdominoplasty scar and  the ultrasound waves are unable to pass through the scar  fully to obtain clear images.  The patient was informed that anomalies may be missed due  to technical limitations. If the fetus is in a suboptimal position  or maternal habitus is increased, visualization of the fetus in  the maternal uterus may be impaired.  A follow-up exam was scheduled in 6 weeks when  the fetus is  hopefully beyond her prior abdominoplasty scar to complete  the views of the fetal anatomy. ----------------------------------------------------------------------                  Johnell Comings, MD Electronically Signed Final Report   05/08/2022 12:34 pm ----------------------------------------------------------------------   All questions were answered. The patient knows to call the clinic with any problems, questions or concerns. I spent 30 minutes in the care of this patient including H and P, review of records, counseling and coordination of care.     Benay Pike, MD 05/09/2022 4:26 PM

## 2022-05-10 ENCOUNTER — Encounter (INDEPENDENT_AMBULATORY_CARE_PROVIDER_SITE_OTHER): Payer: Self-pay

## 2022-05-10 ENCOUNTER — Other Ambulatory Visit (HOSPITAL_COMMUNITY): Payer: Self-pay | Admitting: *Deleted

## 2022-05-15 ENCOUNTER — Encounter (HOSPITAL_COMMUNITY)
Admission: RE | Admit: 2022-05-15 | Discharge: 2022-05-15 | Disposition: A | Discharge status: Home / Self Care | Payer: BC Managed Care – PPO | Source: Ambulatory Visit | Attending: Hematology and Oncology | Admitting: Hematology and Oncology

## 2022-05-15 DIAGNOSIS — D509 Iron deficiency anemia, unspecified: Secondary | ICD-10-CM | POA: Diagnosis not present

## 2022-05-15 MED ORDER — SODIUM CHLORIDE 0.9 % IV SOLN
200.0000 mg | Freq: Once | INTRAVENOUS | Status: AC
  Administered 2022-05-15: 200 mg via INTRAVENOUS
  Filled 2022-05-15: qty 200

## 2022-05-15 MED ORDER — SODIUM CHLORIDE 0.9 % IV SOLN
200.0000 mg | INTRAVENOUS | Status: DC
  Filled 2022-05-15: qty 10

## 2022-05-17 ENCOUNTER — Ambulatory Visit (HOSPITAL_COMMUNITY)
Admission: RE | Admit: 2022-05-17 | Discharge: 2022-05-17 | Disposition: A | Discharge status: Home / Self Care | Payer: BC Managed Care – PPO | Source: Ambulatory Visit | Attending: Hematology and Oncology | Admitting: Hematology and Oncology

## 2022-05-17 DIAGNOSIS — D509 Iron deficiency anemia, unspecified: Secondary | ICD-10-CM | POA: Diagnosis not present

## 2022-05-17 MED ORDER — SODIUM CHLORIDE 0.9 % IV SOLN
200.0000 mg | INTRAVENOUS | Status: DC
  Administered 2022-05-17: 200 mg via INTRAVENOUS
  Filled 2022-05-17: qty 200

## 2022-05-19 ENCOUNTER — Encounter (HOSPITAL_COMMUNITY)
Admission: RE | Admit: 2022-05-19 | Discharge: 2022-05-19 | Disposition: A | Discharge status: Home / Self Care | Payer: BC Managed Care – PPO | Source: Ambulatory Visit | Attending: Hematology and Oncology | Admitting: Hematology and Oncology

## 2022-05-19 DIAGNOSIS — D509 Iron deficiency anemia, unspecified: Secondary | ICD-10-CM | POA: Diagnosis not present

## 2022-05-19 MED ORDER — SODIUM CHLORIDE 0.9 % IV SOLN
200.0000 mg | INTRAVENOUS | Status: DC
  Administered 2022-05-19: 200 mg via INTRAVENOUS
  Filled 2022-05-19: qty 200

## 2022-05-22 ENCOUNTER — Encounter (HOSPITAL_COMMUNITY)
Admission: RE | Admit: 2022-05-22 | Discharge: 2022-05-22 | Disposition: A | Discharge status: Home / Self Care | Payer: BC Managed Care – PPO | Source: Ambulatory Visit | Attending: Hematology and Oncology | Admitting: Hematology and Oncology

## 2022-05-22 DIAGNOSIS — D509 Iron deficiency anemia, unspecified: Secondary | ICD-10-CM | POA: Diagnosis not present

## 2022-05-22 MED ORDER — METHYLPREDNISOLONE SODIUM SUCC 125 MG IJ SOLR: 125.0000 mg | Freq: Once | INTRAMUSCULAR | Status: DC | PRN

## 2022-05-22 MED ORDER — EPINEPHRINE PF 1 MG/ML IJ SOLN: 0.3000 mg | Freq: Once | INTRAMUSCULAR | Status: DC | PRN

## 2022-05-22 MED ORDER — DIPHENHYDRAMINE HCL 50 MG/ML IJ SOLN: 25.0000 mg | Freq: Once | INTRAMUSCULAR | Status: DC | PRN

## 2022-05-22 MED ORDER — SODIUM CHLORIDE 0.9 % IV SOLN
200.0000 mg | INTRAVENOUS | Status: DC
  Administered 2022-05-22: 200 mg via INTRAVENOUS
  Filled 2022-05-22: qty 200

## 2022-05-22 MED ORDER — SODIUM CHLORIDE 0.9 % IV SOLN: INTRAVENOUS | Status: DC | PRN

## 2022-05-22 MED ORDER — ALBUTEROL SULFATE (2.5 MG/3ML) 0.083% IN NEBU: 2.5000 mg | INHALATION_SOLUTION | Freq: Once | RESPIRATORY_TRACT | Status: DC | PRN

## 2022-05-22 MED ORDER — SODIUM CHLORIDE 0.9 % IV BOLUS: 500.0000 mL | Freq: Once | INTRAVENOUS | Status: DC | PRN

## 2022-05-23 ENCOUNTER — Encounter (HOSPITAL_COMMUNITY): Payer: BC Managed Care – PPO

## 2022-06-06 DIAGNOSIS — O09299 Supervision of pregnancy with other poor reproductive or obstetric history, unspecified trimester: Secondary | ICD-10-CM | POA: Diagnosis not present

## 2022-06-06 DIAGNOSIS — O99012 Anemia complicating pregnancy, second trimester: Secondary | ICD-10-CM | POA: Diagnosis not present

## 2022-06-06 DIAGNOSIS — O99212 Obesity complicating pregnancy, second trimester: Secondary | ICD-10-CM | POA: Diagnosis not present

## 2022-06-06 DIAGNOSIS — O09529 Supervision of elderly multigravida, unspecified trimester: Secondary | ICD-10-CM | POA: Diagnosis not present

## 2022-06-06 DIAGNOSIS — O09522 Supervision of elderly multigravida, second trimester: Secondary | ICD-10-CM | POA: Diagnosis not present

## 2022-06-07 DIAGNOSIS — Z0289 Encounter for other administrative examinations: Secondary | ICD-10-CM

## 2022-06-22 ENCOUNTER — Ambulatory Visit: Payer: BC Managed Care – PPO | Admitting: *Deleted

## 2022-06-22 ENCOUNTER — Ambulatory Visit: Payer: BC Managed Care – PPO | Attending: Obstetrics

## 2022-06-22 VITALS — BP 122/63 | HR 80

## 2022-06-22 DIAGNOSIS — O09292 Supervision of pregnancy with other poor reproductive or obstetric history, second trimester: Secondary | ICD-10-CM | POA: Diagnosis not present

## 2022-06-22 DIAGNOSIS — Z3A25 25 weeks gestation of pregnancy: Secondary | ICD-10-CM

## 2022-06-22 DIAGNOSIS — R638 Other symptoms and signs concerning food and fluid intake: Secondary | ICD-10-CM | POA: Insufficient documentation

## 2022-06-22 DIAGNOSIS — E669 Obesity, unspecified: Secondary | ICD-10-CM

## 2022-06-22 DIAGNOSIS — O99842 Bariatric surgery status complicating pregnancy, second trimester: Secondary | ICD-10-CM

## 2022-06-22 DIAGNOSIS — O99212 Obesity complicating pregnancy, second trimester: Secondary | ICD-10-CM | POA: Diagnosis not present

## 2022-06-22 DIAGNOSIS — O09522 Supervision of elderly multigravida, second trimester: Secondary | ICD-10-CM | POA: Insufficient documentation

## 2022-06-23 ENCOUNTER — Other Ambulatory Visit: Payer: Self-pay | Admitting: *Deleted

## 2022-06-23 DIAGNOSIS — O09522 Supervision of elderly multigravida, second trimester: Secondary | ICD-10-CM

## 2022-06-23 DIAGNOSIS — O99212 Obesity complicating pregnancy, second trimester: Secondary | ICD-10-CM

## 2022-06-23 DIAGNOSIS — O09299 Supervision of pregnancy with other poor reproductive or obstetric history, unspecified trimester: Secondary | ICD-10-CM

## 2022-06-23 DIAGNOSIS — Z9884 Bariatric surgery status: Secondary | ICD-10-CM

## 2022-06-28 ENCOUNTER — Ambulatory Visit (INDEPENDENT_AMBULATORY_CARE_PROVIDER_SITE_OTHER): Payer: Managed Care, Other (non HMO) | Admitting: Family Medicine

## 2022-07-04 ENCOUNTER — Encounter (INDEPENDENT_AMBULATORY_CARE_PROVIDER_SITE_OTHER): Payer: Self-pay | Admitting: Family Medicine

## 2022-07-04 ENCOUNTER — Ambulatory Visit (INDEPENDENT_AMBULATORY_CARE_PROVIDER_SITE_OTHER): Payer: BC Managed Care – PPO | Admitting: Family Medicine

## 2022-07-04 VITALS — BP 122/69 | HR 82 | Temp 98.4°F | Ht 71.0 in | Wt 312.0 lb

## 2022-07-04 DIAGNOSIS — Z9884 Bariatric surgery status: Secondary | ICD-10-CM

## 2022-07-04 DIAGNOSIS — Z3A26 26 weeks gestation of pregnancy: Secondary | ICD-10-CM | POA: Insufficient documentation

## 2022-07-04 DIAGNOSIS — Z6841 Body Mass Index (BMI) 40.0 and over, adult: Secondary | ICD-10-CM

## 2022-07-04 DIAGNOSIS — K912 Postsurgical malabsorption, not elsewhere classified: Secondary | ICD-10-CM | POA: Diagnosis not present

## 2022-07-04 DIAGNOSIS — F3289 Other specified depressive episodes: Secondary | ICD-10-CM

## 2022-07-04 DIAGNOSIS — R635 Abnormal weight gain: Secondary | ICD-10-CM | POA: Diagnosis not present

## 2022-07-04 DIAGNOSIS — E669 Obesity, unspecified: Secondary | ICD-10-CM | POA: Diagnosis not present

## 2022-07-04 DIAGNOSIS — R5383 Other fatigue: Secondary | ICD-10-CM | POA: Diagnosis not present

## 2022-07-04 DIAGNOSIS — R0602 Shortness of breath: Secondary | ICD-10-CM | POA: Diagnosis not present

## 2022-07-04 DIAGNOSIS — Z903 Acquired absence of stomach [part of]: Secondary | ICD-10-CM

## 2022-07-04 DIAGNOSIS — F32A Depression, unspecified: Secondary | ICD-10-CM | POA: Insufficient documentation

## 2022-07-05 ENCOUNTER — Telehealth (INDEPENDENT_AMBULATORY_CARE_PROVIDER_SITE_OTHER): Payer: Self-pay | Admitting: Family Medicine

## 2022-07-05 DIAGNOSIS — O09529 Supervision of elderly multigravida, unspecified trimester: Secondary | ICD-10-CM | POA: Diagnosis not present

## 2022-07-05 DIAGNOSIS — O99013 Anemia complicating pregnancy, third trimester: Secondary | ICD-10-CM | POA: Diagnosis not present

## 2022-07-05 DIAGNOSIS — O09523 Supervision of elderly multigravida, third trimester: Secondary | ICD-10-CM | POA: Diagnosis not present

## 2022-07-05 DIAGNOSIS — O09299 Supervision of pregnancy with other poor reproductive or obstetric history, unspecified trimester: Secondary | ICD-10-CM | POA: Diagnosis not present

## 2022-07-05 NOTE — Telephone Encounter (Signed)
Pt called 10/04 need labs from 10/03 fax over to Dr Garwin Brothers office [place] fax # [phone]

## 2022-07-06 LAB — COMPREHENSIVE METABOLIC PANEL
ALT: 15 IU/L (ref 0–32)
AST: 15 IU/L (ref 0–40)
Albumin/Globulin Ratio: 1.1 — ABNORMAL LOW (ref 1.2–2.2)
Albumin: 3.4 g/dL — ABNORMAL LOW (ref 3.9–4.9)
Alkaline Phosphatase: 66 IU/L (ref 44–121)
BUN/Creatinine Ratio: 16 (ref 9–23)
BUN: 7 mg/dL (ref 6–20)
Bilirubin Total: 0.2 mg/dL (ref 0.0–1.2)
CO2: 20 mmol/L (ref 20–29)
Calcium: 8.7 mg/dL (ref 8.7–10.2)
Chloride: 104 mmol/L (ref 96–106)
Creatinine, Ser: 0.43 mg/dL — ABNORMAL LOW (ref 0.57–1.00)
Globulin, Total: 3.1 g/dL (ref 1.5–4.5)
Glucose: 61 mg/dL — ABNORMAL LOW (ref 70–99)
Potassium: 4.2 mmol/L (ref 3.5–5.2)
Sodium: 136 mmol/L (ref 134–144)
Total Protein: 6.5 g/dL (ref 6.0–8.5)
eGFR: 130 mL/min/{1.73_m2} (ref >59)

## 2022-07-06 LAB — LIPID PANEL WITH LDL/HDL RATIO
Cholesterol, Total: 195 mg/dL (ref 100–199)
HDL: 59 mg/dL (ref >39)
LDL Chol Calc (NIH): 119 mg/dL — ABNORMAL HIGH (ref 0–99)
LDL/HDL Ratio: 2 ratio (ref 0.0–3.2)
Triglycerides: 96 mg/dL (ref 0–149)
VLDL Cholesterol Cal: 17 mg/dL (ref 5–40)

## 2022-07-06 LAB — TSH: TSH: 1.49 u[IU]/mL (ref 0.450–4.500)

## 2022-07-06 LAB — HEMOGLOBIN A1C
Est. average glucose Bld gHb Est-mCnc: 97 mg/dL
Hgb A1c MFr Bld: 5 % (ref 4.8–5.6)

## 2022-07-06 LAB — INSULIN, RANDOM: INSULIN: 8.1 u[IU]/mL (ref 2.6–24.9)

## 2022-07-06 LAB — VITAMIN B12: Vitamin B-12: 163 pg/mL — ABNORMAL LOW (ref 232–1245)

## 2022-07-06 LAB — FOLATE: Folate: 9.3 ng/mL (ref >3.0)

## 2022-07-06 LAB — PREALBUMIN: PREALBUMIN: 14 mg/dL (ref 14–35)

## 2022-07-06 LAB — T4, FREE: Free T4: 0.94 ng/dL (ref 0.82–1.77)

## 2022-07-06 LAB — VITAMIN D 25 HYDROXY (VIT D DEFICIENCY, FRACTURES): Vit D, 25-Hydroxy: 13.2 ng/mL — ABNORMAL LOW (ref 30.0–100.0)

## 2022-07-06 NOTE — Telephone Encounter (Signed)
Labs sent 07/06/22 @ 7:02-CS

## 2022-07-12 ENCOUNTER — Ambulatory Visit (INDEPENDENT_AMBULATORY_CARE_PROVIDER_SITE_OTHER): Payer: Managed Care, Other (non HMO) | Admitting: Family Medicine

## 2022-07-13 NOTE — Progress Notes (Signed)
Chief Complaint:   OBESITY Carrie Shaw (MR# [id number]) is a 36 y.o. female who presents for evaluation and treatment of obesity and related comorbidities. Current BMI is Body mass index is 43.52 kg/m. Carrie Shaw has been struggling with her weight for many years and has been unsuccessful in either losing weight, maintaining weight loss, or reaching her healthy weight goal.  Carrie Shaw has a sedentary job working from home for [employer], not very active.  She has gained 25 lbs since the start of the pregnancy. Had RYGB surgery in 2019. Nadir weight 262 lbs.  She would like to see 200-220 lbs.    Carrie Shaw is currently in the action stage of change and ready to dedicate time achieving and maintaining a healthier weight. Carrie Shaw is interested in becoming our patient and working on intensive lifestyle modifications including (but not limited to) diet and exercise for weight loss.  Carrie Shaw's habits were reviewed today and are as follows: she thinks her family will eat healthier with her, she struggles with family and or coworkers weight loss sabotage, her desired weight loss is 97 lbs, she has been heavy most of her life, she started gaining weight in the 6th grade, her heaviest weight ever was 410 pounds, she has significant food cravings issues, she snacks frequently in the evenings, she wakes up frequently in the middle of the night to eat, she is frequently drinking liquids with calories, she frequently makes poor food choices, she frequently eats larger portions than normal, and she struggles with emotional eating.  Depression Screen Carrie Shaw's Food and Mood (modified PHQ-9) score was 12.     07/04/2022    9:08 AM  Depression screen PHQ 2/9  Decreased Interest 1  Down, Depressed, Hopeless 1  PHQ - 2 Score 2  Altered sleeping 3  Tired, decreased energy 3  Change in appetite 3  Feeling bad or failure about yourself  1  Trouble concentrating 0  Moving slowly or fidgety/restless 0   Suicidal thoughts 0  PHQ-9 Score 12  Difficult doing work/chores Somewhat difficult   Subjective:   1. Other fatigue Pandora admits to daytime somnolence and admits to waking up still tired. Patient has a history of symptoms of daytime fatigue and morning fatigue. Carrie Shaw generally gets 6 hours of sleep per night, and states that she has nightime awakenings. Snoring is present. Apneic episodes are not present. Epworth Sleepiness Score is 6.   EKG  and IC reviewed today.   2. SOBOE (shortness of breath on exertion) Koral notes increasing shortness of breath with exercising and seems to be worsening over time with weight gain. She notes getting out of breath sooner with activity than she used to. This has not gotten worse recently. Carrie Shaw denies shortness of breath at rest or orthopnea.   3. [redacted] weeks gestation of pregnancy She is pregnant at [redacted] weeks gestation.  She is taking a prenatal vitamin daily and being managed by Dr Cherly Hensen.   4. Weight gain following gastric bypass surgery 2019, Dr Andrey Campanile, denies GERD.  Less restriction, dumping syndrome from excess sweets.  410 lbs pre-op-262 lbs Nadir.  Started school 2021, has less free time to walk and meal prep.   5. Intestinal malabsorption following gastrectomy History of iron, B-12, Vitamin D deficiency.  Complains of fatigue.  She had an IV iron infusion in August.  She is off Vitamin D and B-12 now.   6.  Other depression, with emotional eating PHQ9-12.  She is not on medications.  Assessment/Plan:   1. Other fatigue Camil does feel that her weight is causing her energy to be lower than it should be. Fatigue may be related to obesity, depression or many other causes. Labs will be ordered, and in the meanwhile, Carrie Shaw will focus on self care including making healthy food choices, increasing physical activity and focusing on stress reduction. Update labs today.  She is at risk for vitamin malnutrition due to RYGB surgery.  -  EKG 12-Lead - Comprehensive metabolic panel - Hemoglobin A1c - Insulin, random - Lipid Panel With LDL/HDL Ratio - VITAMIN D 25 Hydroxy (Vit-D Deficiency, Fractures) - TSH - T4, free  2. SOBOE (shortness of breath on exertion) Carrie Shaw does feel that she gets out of breath more easily that she used to when she exercises. Carrie Shaw's shortness of breath appears to be obesity related and exercise induced. She has agreed to work on weight loss and gradually increase exercise to treat her exercise induced shortness of breath. Will continue to monitor closely.   3. [redacted] weeks gestation of pregnancy Avoid use of AOM's.  Focus on improved nutrition, maintaining weight, hydration and walking.   - Prenatal Vit-Fe Fumarate-FA (PRENATAL VITAMIN PO); Take by mouth.  4. Weight gain following gastric bypass surgery Reviewed post bariatric surgery recommendations.  5. Intestinal malabsorption following gastrectomy Update labs today, continue prenatal vitamin daily.   - Vitamin B12 - Folate - VITAMIN D 25 Hydroxy (Vit-D Deficiency, Fractures) - Prealbumin  6. Other depression, with emotional eating Carrie Shaw had a positive depression screening. Depression is commonly associated with obesity and often results in emotional eating behaviors. We will monitor this closely and work on CBT to help improve the non-hunger eating patterns. Referral to Psychology may be required if no improvement is seen as she continues in our clinic.  Consider CBT with Dr Dewaine Conger.  Avoid medications during pregnancy if possible.   7. Obesity, current BMI 43.5 Carrie Shaw is currently in the action stage of change and her goal is to continue with weight loss efforts. I recommend Carrie Shaw begin the structured treatment plan as follows:  Reviewed weight expectations during current pregnancy.  Currently 26 weeks.  Goal=maintain weight during pregnancy.   She has agreed to the Category 4 Plan.  Exercise goals:  Plans to walk during  lunch break.      Behavioral modification strategies: increasing lean protein intake, increasing vegetables, increasing water intake, decreasing liquid calories, decreasing eating out, no skipping meals, meal planning and cooking strategies, better snacking choices, and decreasing junk food.  She was informed of the importance of frequent follow-up visits to maximize her success with intensive lifestyle modifications for her multiple health conditions. She was informed we would discuss her lab results at her next visit unless there is a critical issue that needs to be addressed sooner. Amariss agreed to keep her next visit at the agreed upon time to discuss these results.  Objective:   Blood pressure 122/69, pulse 82, temperature 98.4 F (36.9 C), height 5\' 11"  (1.803 m), weight (!) 312 lb (141.5 kg), last menstrual period 12/24/2021, SpO2 100 %. Body mass index is 43.52 kg/m.  EKG: Normal sinus rhythm, rate 77 BPM.  Indirect Calorimeter completed today shows a VO2 of 338 and a REE of 2333.  Her calculated basal metabolic rate is 12/26/2021 thus her basal metabolic rate is better than expected.  General: Cooperative, alert, well developed, in no acute distress. HEENT: Conjunctivae and lids unremarkable. Cardiovascular: Regular rhythm.  Lungs: Normal work of breathing.  Neurologic: No focal deficits.   Lab Results  Component Value Date   CREATININE 0.43 (L) 07/04/2022   BUN 7 07/04/2022   NA 136 07/04/2022   K 4.2 07/04/2022   CL 104 07/04/2022   CO2 20 07/04/2022   Lab Results  Component Value Date   ALT 15 07/04/2022   AST 15 07/04/2022   ALKPHOS 66 07/04/2022   BILITOT 0.2 07/04/2022   Lab Results  Component Value Date   HGBA1C 5.0 07/04/2022   HGBA1C 4.9 01/06/2020   Lab Results  Component Value Date   INSULIN 8.1 07/04/2022   INSULIN 7.8 01/06/2020   Lab Results  Component Value Date   TSH 1.490 07/04/2022   Lab Results  Component Value Date   CHOL 195 07/04/2022    HDL 59 07/04/2022   LDLCALC 119 (H) 07/04/2022   TRIG 96 07/04/2022   CHOLHDL 3.4 12/25/2014   Lab Results  Component Value Date   WBC 11.8 (H) 05/09/2022   HGB 7.9 (L) 05/09/2022   HCT 25.5 (L) 05/09/2022   MCV 75.2 (L) 05/09/2022   PLT 348 05/09/2022   Lab Results  Component Value Date   IRON 19 (L) 03/29/2022   TIBC 521 (H) 03/29/2022   FERRITIN 6 (L) 03/29/2022   Attestation Statements:   Reviewed by clinician on day of visit: allergies, medications, problem list, medical history, surgical history, family history, social history, and previous encounter notes.  I, Davy Pique, am acting as Location manager for Loyal Gambler, DO.  I have reviewed the above documentation for accuracy and completeness, and I agree with the above. Dell Ponto, DO

## 2022-07-18 ENCOUNTER — Encounter (INDEPENDENT_AMBULATORY_CARE_PROVIDER_SITE_OTHER): Payer: Self-pay | Admitting: Family Medicine

## 2022-07-18 ENCOUNTER — Ambulatory Visit (INDEPENDENT_AMBULATORY_CARE_PROVIDER_SITE_OTHER): Payer: BC Managed Care – PPO | Admitting: Family Medicine

## 2022-07-18 VITALS — BP 124/83 | HR 81 | Temp 97.8°F | Ht 71.0 in | Wt 314.0 lb

## 2022-07-18 DIAGNOSIS — R635 Abnormal weight gain: Secondary | ICD-10-CM

## 2022-07-18 DIAGNOSIS — Z6841 Body Mass Index (BMI) 40.0 and over, adult: Secondary | ICD-10-CM

## 2022-07-18 DIAGNOSIS — D649 Anemia, unspecified: Secondary | ICD-10-CM | POA: Insufficient documentation

## 2022-07-18 DIAGNOSIS — D508 Other iron deficiency anemias: Secondary | ICD-10-CM

## 2022-07-18 DIAGNOSIS — Z9884 Bariatric surgery status: Secondary | ICD-10-CM

## 2022-07-18 DIAGNOSIS — E538 Deficiency of other specified B group vitamins: Secondary | ICD-10-CM | POA: Insufficient documentation

## 2022-07-18 DIAGNOSIS — E669 Obesity, unspecified: Secondary | ICD-10-CM

## 2022-07-18 DIAGNOSIS — E559 Vitamin D deficiency, unspecified: Secondary | ICD-10-CM | POA: Diagnosis not present

## 2022-07-18 MED ORDER — VITAMIN D (ERGOCALCIFEROL) 1.25 MG (50000 UNIT) PO CAPS: 50000.0000 [IU] | ORAL_CAPSULE | ORAL | 0 refills | Status: DC

## 2022-07-18 MED ORDER — VITAMIN B-12 1000 MCG PO TABS: 1000.0000 ug | ORAL_TABLET | Freq: Every day | ORAL | 0 refills | Status: DC

## 2022-07-19 ENCOUNTER — Ambulatory Visit: Payer: BC Managed Care – PPO | Attending: Maternal & Fetal Medicine

## 2022-07-19 ENCOUNTER — Ambulatory Visit: Payer: BC Managed Care – PPO

## 2022-07-19 VITALS — BP 130/81 | HR 80

## 2022-07-19 DIAGNOSIS — O09299 Supervision of pregnancy with other poor reproductive or obstetric history, unspecified trimester: Secondary | ICD-10-CM | POA: Insufficient documentation

## 2022-07-19 DIAGNOSIS — E669 Obesity, unspecified: Secondary | ICD-10-CM

## 2022-07-19 DIAGNOSIS — Z23 Encounter for immunization: Secondary | ICD-10-CM | POA: Diagnosis not present

## 2022-07-19 DIAGNOSIS — O09523 Supervision of elderly multigravida, third trimester: Secondary | ICD-10-CM | POA: Diagnosis not present

## 2022-07-19 DIAGNOSIS — O99213 Obesity complicating pregnancy, third trimester: Secondary | ICD-10-CM | POA: Diagnosis not present

## 2022-07-19 DIAGNOSIS — O99212 Obesity complicating pregnancy, second trimester: Secondary | ICD-10-CM | POA: Insufficient documentation

## 2022-07-19 DIAGNOSIS — O99843 Bariatric surgery status complicating pregnancy, third trimester: Secondary | ICD-10-CM

## 2022-07-19 DIAGNOSIS — Z9884 Bariatric surgery status: Secondary | ICD-10-CM | POA: Insufficient documentation

## 2022-07-19 DIAGNOSIS — O09522 Supervision of elderly multigravida, second trimester: Secondary | ICD-10-CM | POA: Insufficient documentation

## 2022-07-19 DIAGNOSIS — Z3A29 29 weeks gestation of pregnancy: Secondary | ICD-10-CM

## 2022-07-19 DIAGNOSIS — O09529 Supervision of elderly multigravida, unspecified trimester: Secondary | ICD-10-CM | POA: Diagnosis not present

## 2022-07-19 DIAGNOSIS — Z362 Encounter for other antenatal screening follow-up: Secondary | ICD-10-CM

## 2022-07-19 DIAGNOSIS — O99013 Anemia complicating pregnancy, third trimester: Secondary | ICD-10-CM | POA: Diagnosis not present

## 2022-07-19 DIAGNOSIS — O09293 Supervision of pregnancy with other poor reproductive or obstetric history, third trimester: Secondary | ICD-10-CM

## 2022-07-19 NOTE — Telephone Encounter (Signed)
Called pt 10/18 to see if labs was received at doctors office.mail box was full

## 2022-07-20 ENCOUNTER — Other Ambulatory Visit: Payer: Self-pay | Admitting: *Deleted

## 2022-07-20 DIAGNOSIS — O09523 Supervision of elderly multigravida, third trimester: Secondary | ICD-10-CM

## 2022-07-20 DIAGNOSIS — O99213 Obesity complicating pregnancy, third trimester: Secondary | ICD-10-CM

## 2022-07-20 DIAGNOSIS — O10913 Unspecified pre-existing hypertension complicating pregnancy, third trimester: Secondary | ICD-10-CM

## 2022-07-26 NOTE — Progress Notes (Signed)
Chief Complaint:   OBESITY Carrie Shaw is here to discuss her progress with her obesity treatment plan along with follow-up of her obesity related diagnoses. Carrie Shaw is on the Category 4 Plan and states she is following her eating plan approximately 80% of the time. Carrie Shaw states she is walking 30 minutes 5 times per week.  Today's visit was #: 2 Starting weight: 314 lbs Starting date: 07/04/2022 Today's weight: 314 lbs Today's date: 07/18/2022 Total lbs lost to date: 0 Total lbs lost since last in-office visit: 0  Interim History: She is having a hard time getting in meat due to pregnancy meat aversions.  Food volumes are smaller due to history of gastric bypass.  Drinking outside of the meals.  Works from home.  Added a protein shake, more fruit and black bean burgers.  She is walking more regularly.    Subjective:   1. Weight gain status post gastric bypass Has good restriction, denies abdominal pain.  Taking a prenatal vitamin daily.  Pre-op weight 410 lbs, Nadir weight 262 lbs.   2. B12 deficiency Discussed labs with patient today. Was on B12 injections monthly.  Last done in August. B-12 level was low 163.  She denies paresthesias.   3. Vitamin D deficiency Discussed labs with patient today. Vitamin D 13.2, not on a Vitamin D supplement.  Energy level fair.   4. Other iron deficiency anemia Last iron infusion in August (4th one).  Seeing Dr Al Pimple 08/10/2022 to recheck iron levels.  Energy level improving.  Assessment/Plan:   1. Weight gain status post gastric bypass Continue small meals daily, water outside of meal time, prenatal vitamin daily.   2. B12 deficiency Begin - cyanocobalamin (VITAMIN B12) 1000 MCG tablet; Take 1 tablet (1,000 mcg total) by mouth daily.  Dispense: 30 tablet; Refill: 0  3. Vitamin D deficiency Begin - Vitamin D, Ergocalciferol, (DRISDOL) 1.25 MG (50000 UNIT) CAPS capsule; Take 1 capsule (50,000 Units total) by mouth every 7 (seven)  days.  Dispense: 5 capsule; Refill: 0  4. Other iron deficiency anemia Follow up with hematology in November to recheck iron levels.   5. Obesity,current BMI 43.9 1) Allow 2 fruit servings per day.  2) Can swap bread at lunch for salads, oats, beans, potatoes or brown rice.   3) Allow 1-2 protein shakes per day and meat alternative protein sources, hand out given.   Carrie Shaw is currently in the action stage of change. As such, her goal is to continue with weight loss efforts. She has agreed to the Category 4 Plan+2000 calories and 90 grams of protein daily.   Exercise goals:  As is.   Behavioral modification strategies: increasing lean protein intake, increasing vegetables, increasing water intake, increasing high fiber foods, decreasing eating out, no skipping meals, meal planning and cooking strategies, and planning for success.  Carrie Shaw has agreed to follow-up with our clinic in 3 weeks. She was informed of the importance of frequent follow-up visits to maximize her success with intensive lifestyle modifications for her multiple health conditions.   Objective:   Blood pressure 124/83, pulse 81, temperature 97.8 F (36.6 C), height 5\' 11"  (1.803 m), weight (!) 314 lb (142.4 kg), last menstrual period 12/24/2021, SpO2 100 %. Body mass index is 43.79 kg/m.  General: Cooperative, alert, well developed, in no acute distress. HEENT: Conjunctivae and lids unremarkable. Cardiovascular: Regular rhythm.  Lungs: Normal work of breathing. Neurologic: No focal deficits.   Lab Results  Component Value Date   CREATININE  0.43 (L) 07/04/2022   BUN 7 07/04/2022   NA 136 07/04/2022   K 4.2 07/04/2022   CL 104 07/04/2022   CO2 20 07/04/2022   Lab Results  Component Value Date   ALT 15 07/04/2022   AST 15 07/04/2022   ALKPHOS 66 07/04/2022   BILITOT 0.2 07/04/2022   Lab Results  Component Value Date   HGBA1C 5.0 07/04/2022   HGBA1C 4.9 01/06/2020   Lab Results  Component Value  Date   INSULIN 8.1 07/04/2022   INSULIN 7.8 01/06/2020   Lab Results  Component Value Date   TSH 1.490 07/04/2022   Lab Results  Component Value Date   CHOL 195 07/04/2022   HDL 59 07/04/2022   LDLCALC 119 (H) 07/04/2022   TRIG 96 07/04/2022   CHOLHDL 3.4 12/25/2014   Lab Results  Component Value Date   VD25OH 13.2 (L) 07/04/2022   VD25OH 17.8 (L) 01/06/2020   Lab Results  Component Value Date   WBC 11.8 (H) 05/09/2022   HGB 7.9 (L) 05/09/2022   HCT 25.5 (L) 05/09/2022   MCV 75.2 (L) 05/09/2022   PLT 348 05/09/2022   Lab Results  Component Value Date   IRON 19 (L) 03/29/2022   TIBC 521 (H) 03/29/2022   FERRITIN 6 (L) 03/29/2022   Attestation Statements:   Reviewed by clinician on day of visit: allergies, medications, problem list, medical history, surgical history, family history, social history, and previous encounter notes.  I, Davy Pique, am acting as Location manager for Loyal Gambler, DO.  I have reviewed the above documentation for accuracy and completeness, and I agree with the above. Dell Ponto, DO

## 2022-07-29 ENCOUNTER — Inpatient Hospital Stay (HOSPITAL_COMMUNITY)
Admission: AD | Admit: 2022-07-29 | Discharge: 2022-07-29 | Disposition: A | Discharge status: Home / Self Care | Payer: BC Managed Care – PPO | Attending: Obstetrics and Gynecology | Admitting: Obstetrics and Gynecology

## 2022-07-29 ENCOUNTER — Encounter (HOSPITAL_COMMUNITY): Payer: Self-pay | Admitting: Obstetrics and Gynecology

## 2022-07-29 DIAGNOSIS — Z3A31 31 weeks gestation of pregnancy: Secondary | ICD-10-CM | POA: Insufficient documentation

## 2022-07-29 DIAGNOSIS — O09523 Supervision of elderly multigravida, third trimester: Secondary | ICD-10-CM | POA: Insufficient documentation

## 2022-07-29 DIAGNOSIS — O26893 Other specified pregnancy related conditions, third trimester: Secondary | ICD-10-CM | POA: Diagnosis not present

## 2022-07-29 DIAGNOSIS — O99843 Bariatric surgery status complicating pregnancy, third trimester: Secondary | ICD-10-CM | POA: Diagnosis not present

## 2022-07-29 DIAGNOSIS — N898 Other specified noninflammatory disorders of vagina: Secondary | ICD-10-CM

## 2022-07-29 DIAGNOSIS — O368130 Decreased fetal movements, third trimester, not applicable or unspecified: Secondary | ICD-10-CM | POA: Diagnosis not present

## 2022-07-29 DIAGNOSIS — Z9889 Other specified postprocedural states: Secondary | ICD-10-CM

## 2022-07-29 DIAGNOSIS — O09293 Supervision of pregnancy with other poor reproductive or obstetric history, third trimester: Secondary | ICD-10-CM

## 2022-07-29 DIAGNOSIS — O99213 Obesity complicating pregnancy, third trimester: Secondary | ICD-10-CM | POA: Diagnosis not present

## 2022-07-29 DIAGNOSIS — O133 Gestational [pregnancy-induced] hypertension without significant proteinuria, third trimester: Secondary | ICD-10-CM | POA: Insufficient documentation

## 2022-07-29 LAB — CBC
HCT: 25.9 % — ABNORMAL LOW (ref 36.0–46.0)
Hemoglobin: 8.3 g/dL — ABNORMAL LOW (ref 12.0–15.0)
MCH: 26.1 pg (ref 26.0–34.0)
MCHC: 32 g/dL (ref 30.0–36.0)
MCV: 81.4 fL (ref 80.0–100.0)
Platelets: 293 10*3/uL (ref 150–400)
RBC: 3.18 MIL/uL — ABNORMAL LOW (ref 3.87–5.11)
RDW: 19.9 % — ABNORMAL HIGH (ref 11.5–15.5)
WBC: 11.8 10*3/uL — ABNORMAL HIGH (ref 4.0–10.5)
nRBC: 0 % (ref 0.0–0.2)

## 2022-07-29 LAB — PROTEIN / CREATININE RATIO, URINE
Creatinine, Urine: 196 mg/dL
Protein Creatinine Ratio: 0.12 mg/mg{Cre} (ref 0.00–0.15)
Total Protein, Urine: 24 mg/dL

## 2022-07-29 LAB — URINALYSIS, ROUTINE W REFLEX MICROSCOPIC
Bilirubin Urine: NEGATIVE
Glucose, UA: NEGATIVE mg/dL
Hgb urine dipstick: NEGATIVE
Ketones, ur: NEGATIVE mg/dL
Leukocytes,Ua: NEGATIVE
Nitrite: NEGATIVE
Protein, ur: NEGATIVE mg/dL
Specific Gravity, Urine: 1.025 (ref 1.005–1.030)
pH: 5 (ref 5.0–8.0)

## 2022-07-29 LAB — COMPREHENSIVE METABOLIC PANEL
ALT: 18 U/L (ref 0–44)
AST: 19 U/L (ref 15–41)
Albumin: 2.6 g/dL — ABNORMAL LOW (ref 3.5–5.0)
Alkaline Phosphatase: 62 U/L (ref 38–126)
Anion gap: 6 (ref 5–15)
BUN: 8 mg/dL (ref 6–20)
CO2: 21 mmol/L — ABNORMAL LOW (ref 22–32)
Calcium: 8.4 mg/dL — ABNORMAL LOW (ref 8.9–10.3)
Chloride: 109 mmol/L (ref 98–111)
Creatinine, Ser: 0.52 mg/dL (ref 0.44–1.00)
GFR, Estimated: >60 mL/min (ref >60)
Glucose, Bld: 81 mg/dL (ref 70–99)
Potassium: 3.5 mmol/L (ref 3.5–5.1)
Sodium: 136 mmol/L (ref 135–145)
Total Bilirubin: <0.1 mg/dL — ABNORMAL LOW (ref 0.3–1.2)
Total Protein: 6 g/dL — ABNORMAL LOW (ref 6.5–8.1)

## 2022-07-29 LAB — POCT FERN TEST: POCT Fern Test: NEGATIVE

## 2022-07-29 LAB — WET PREP, GENITAL
Sperm: NONE SEEN
Trich, Wet Prep: NONE SEEN
WBC, Wet Prep HPF POC: >=10 — AB (ref <10)
Yeast Wet Prep HPF POC: NONE SEEN

## 2022-07-29 MED ORDER — LABETALOL HCL 100 MG PO TABS: 100.0000 mg | ORAL_TABLET | Freq: Two times a day (BID) | ORAL | 0 refills | Status: DC

## 2022-07-29 MED ORDER — LABETALOL HCL 100 MG PO TABS
100.0000 mg | ORAL_TABLET | Freq: Two times a day (BID) | ORAL | Status: DC
  Administered 2022-07-29: 100 mg via ORAL
  Filled 2022-07-29: qty 1

## 2022-07-29 MED ORDER — METRONIDAZOLE 500 MG PO TABS: 500.0000 mg | ORAL_TABLET | Freq: Two times a day (BID) | ORAL | 0 refills | Status: DC

## 2022-07-29 NOTE — MAU Provider Note (Addendum)
History     Chief Complaint  Patient presents with   Hypertension   Headache   Leg Swelling  36 yo G6 P2032 BF @ [redacted] wk gestation sent in for c/o decreased fetal movement. Pt also c/o leg swelling and elevated BP at home. Has had h.a for which she took tylenol. Denies any CP or heartburn. Hx pre-eclampsia on baby ASA. Hx gastric sleeve C/o wet ness between her butt cheeks in the morning  OB History     Gravida  6   Para  2   Term  2   Preterm      AB  2   Living  2      SAB  0   IAB  2   Ectopic      Multiple      Live Births  2           Past Medical History:  Diagnosis Date   ADD (attention deficit disorder)    Anemia    Anxiety    B12 deficiency    Back pain    Bilateral swelling of feet    Depression    Gallbladder problem    HSV infection    Hypertension    Joint pain    Lower extremity edema    Obesity    Sleep apnea    mild no mask   SOB (shortness of breath)    SVD (spontaneous vaginal delivery)    x 2   Termination of pregnancy (fetus)    x 2   UTI (urinary tract infection)    Vitamin B12 deficiency    Vitamin D deficiency     Past Surgical History:  Procedure Laterality Date   APPENDECTOMY     CYSTOSCOPY N/A 08/16/2017   Procedure: CYSTOSCOPY;  Surgeon: Conan Bowens, MD;  Location: [facility];  Service: Gynecology;  Laterality: N/A;   GASTRIC ROUX-EN-Y N/A 01/01/2018   Procedure: LAPAROSCOPIC ROUX-EN-Y GASTRIC BYPASS WITH UPPER ENDOSCOPY;  Surgeon: Gaynelle Adu, MD;  Location: [facility];  Service: General;  Laterality: N/A;   LAPAROSCOPY N/A 08/16/2017   Procedure: LAPAROSCOPY DIAGNOSTIC;  Surgeon: Conan Bowens, MD;  Location: [facility];  Service: Gynecology;  Laterality: N/A;   LAPAROTOMY N/A 08/16/2017   Procedure: EXPLORATORY LAPAROTOMY WITH REMOVAL OF LEFT ECTOPIC PREGNANCY, LEFT SALPINGECTOMY AND CYSTOTOMY OF BLADDER;  Surgeon: Conan Bowens, MD;  Location: [facility];  Service: Gynecology;  Laterality: N/A;   LYSIS OF ADHESION N/A  08/16/2017   Procedure: LYSIS OF ADHESION;  Surgeon: Conan Bowens, MD;  Location: [facility];  Service: Gynecology;  Laterality: N/A;   Tummy tuck     10/2019   UNILATERAL SALPINGECTOMY Left 08/16/2017   Procedure: UNILATERAL SALPINGECTOMY;  Surgeon: Conan Bowens, MD;  Location: [facility];  Service: Gynecology;  Laterality: Left;    Family History  Problem Relation Age of Onset   Depression Mother    Hypertension Mother    Anxiety disorder Mother    Sleep apnea Mother    Obesity Mother    Eating disorder Mother    Hypertension Father    Depression Father    Anxiety disorder Father    Sleep apnea Father    Alcoholism Father    Drug abuse Father    Obesity Father    Schizophrenia Sister    Bipolar disorder Sister     Social History   Tobacco Use   Smoking status: Former    Packs/day: 0.25  Years: 10.00    Total pack years: 2.50    Types: Cigarettes    Quit date: 11/2021    Years since quitting: 0.6   Smokeless tobacco: Never  Vaping Use   Vaping Use: Never used  Substance Use Topics   Alcohol use: Not Currently    Comment: rare   Drug use: No    Allergies: No Known Allergies  Medications Prior to Admission  Medication Sig Dispense Refill Last Dose   Acetaminophen (TYLENOL PO) Take by mouth. Chews   07/29/2022   cyanocobalamin (VITAMIN B12) 1000 MCG tablet Take 1 tablet (1,000 mcg total) by mouth daily. 30 tablet 0 Past Week   Prenatal Vit-Fe Fumarate-FA (PRENATAL VITAMIN PO) Take by mouth.   07/29/2022   Vitamin D, Ergocalciferol, (DRISDOL) 1.25 MG (50000 UNIT) CAPS capsule Take 1 capsule (50,000 Units total) by mouth every 7 (seven) days. 5 capsule 0 Past Week     Physical Exam   Blood pressure (!) 147/75, pulse 68, temperature 98.3 F (36.8 C), temperature source Oral, resp. rate 20, height 5\' 11"  (1.803 m), weight (!) 146.2 kg, last menstrual period 12/24/2021, SpO2 100 %.  General appearance: alert, cooperative, and no distress Lungs: clear to  auscultation bilaterally Heart: regular rate and rhythm, S1, S2 normal, no murmur, click, rub or gallop Abdomen:  gravid soft non tender Pelvic: external genitalia normal and VE thick white discharge( white) Extremities: edema 1+  Tracing: baseline 150 (+) accel to 170  No ctx  IMP: DFM Gestational HTN   Morbid obesity Hx gastric sleeve IUP @ 31 wk Vaginal discharge P)  NST. PIH labs. Start labetalol. Cont with baby ASA.  Wet prep pending ED Course   MDM  Addendum Results for orders placed or performed during the hospital encounter of 07/29/22 (from the past 24 hour(s))  Urinalysis, Routine w reflex microscopic     Status: None   Collection Time: 07/29/22  7:34 PM  Result Value Ref Range   Color, Urine YELLOW YELLOW   APPearance CLEAR CLEAR   Specific Gravity, Urine 1.025 1.005 - 1.030   pH 5.0 5.0 - 8.0   Glucose, UA NEGATIVE NEGATIVE mg/dL   Hgb urine dipstick NEGATIVE NEGATIVE   Bilirubin Urine NEGATIVE NEGATIVE   Ketones, ur NEGATIVE NEGATIVE mg/dL   Protein, ur NEGATIVE NEGATIVE mg/dL   Nitrite NEGATIVE NEGATIVE   Leukocytes,Ua NEGATIVE NEGATIVE  Protein / creatinine ratio, urine     Status: None   Collection Time: 07/29/22  7:37 PM  Result Value Ref Range   Creatinine, Urine 196 mg/dL   Total Protein, Urine 24 mg/dL   Protein Creatinine Ratio 0.12 0.00 - 0.15 mg/mg[Cre]  Comprehensive metabolic panel     Status: Abnormal   Collection Time: 07/29/22  8:11 PM  Result Value Ref Range   Sodium 136 135 - 145 mmol/L   Potassium 3.5 3.5 - 5.1 mmol/L   Chloride 109 98 - 111 mmol/L   CO2 21 (L) 22 - 32 mmol/L   Glucose, Bld 81 70 - 99 mg/dL   BUN 8 6 - 20 mg/dL   Creatinine, Ser 07/31/22 0.44 - 1.00 mg/dL   Calcium 8.4 (L) 8.9 - 10.3 mg/dL   Total Protein 6.0 (L) 6.5 - 8.1 g/dL   Albumin 2.6 (L) 3.5 - 5.0 g/dL   AST 19 15 - 41 U/L   ALT 18 0 - 44 U/L   Alkaline Phosphatase 62 38 - 126 U/L   Total Bilirubin <0.1 (L) 0.3 -  1.2 mg/dL   GFR, Estimated >60 >60 mL/min    Anion gap 6 5 - 15  CBC     Status: Abnormal   Collection Time: 07/29/22  8:11 PM  Result Value Ref Range   WBC 11.8 (H) 4.0 - 10.5 K/uL   RBC 3.18 (L) 3.87 - 5.11 MIL/uL   Hemoglobin 8.3 (L) 12.0 - 15.0 g/dL   HCT 25.9 (L) 36.0 - 46.0 %   MCV 81.4 80.0 - 100.0 fL   MCH 26.1 26.0 - 34.0 pg   MCHC 32.0 30.0 - 36.0 g/dL   RDW 19.9 (H) 11.5 - 15.5 %   Platelets 293 150 - 400 K/uL   nRBC 0.0 0.0 - 0.2 %  Fern Test     Status: Normal   Collection Time: 07/29/22  8:35 PM  Result Value Ref Range   POCT Fern Test Negative = intact amniotic membranes    PIH labs nl. Will continue labetalol Pt will need to contact heme/onc to get IV iron infusions Last one 05/2022 Reassuring NST  Dc/ home  Keep appt for Wednesday  BV on wet prep. Treat with Flagyl x 7 days Daily kick ct, preeclampsia warning signs  Marvene Staff, MD 8:51 PM 07/29/2022

## 2022-07-29 NOTE — Discharge Instructions (Signed)
Daily kick ct, preeclampsia warning signs

## 2022-07-29 NOTE — MAU Provider Note (Signed)
[id number]  at [redacted]w[redacted]d, pt of Dr Garwin Brothers, presents to MAU for swelling, headache, and decreased fetal movement.  MAU provider called by RN due to patient intake BP of 140s/90s and presenting symptoms.  Medical screening done and pt alert and oriented, feeling good fetal movement since arrival in MAU.  Notified pt of labwork ordered by Dr Garwin Brothers and that Dr Garwin Brothers was on her way to see the patient.

## 2022-07-29 NOTE — MAU Note (Signed)
.  Carrie Shaw is a 36 y.o. at [redacted]w[redacted]d here in MAU reporting: swelling leg, feet, and hands  since Weds, DFM not as much movement as usual last movement was an hour ago, HA since Thurs  Pt took her BP this afternoon, 152/90. Pt has not taken any pain medication. Pt also report ABD aching and back pain, as well as "feeling wet" in her underwear through the day for the last week, unsure if she is leaking fluid. Pt denies all other PIH s/s. Pt denies VB, recent intercourse, and complications in the pregnancy.    Onset of complaint: last week Pain score: 2/10 ABD, back 3/10 HA Vitals:   07/29/22 1918  BP: (!) 153/89  Pulse: 81  Resp: 20  Temp: 98.3 F (36.8 C)  SpO2: 100%     FHT:152 Lab orders placed from triage:  UA

## 2022-07-31 ENCOUNTER — Other Ambulatory Visit: Payer: Self-pay | Admitting: *Deleted

## 2022-07-31 DIAGNOSIS — D508 Other iron deficiency anemias: Secondary | ICD-10-CM

## 2022-08-01 ENCOUNTER — Inpatient Hospital Stay: Payer: BC Managed Care – PPO | Attending: Hematology and Oncology

## 2022-08-01 DIAGNOSIS — D508 Other iron deficiency anemias: Secondary | ICD-10-CM | POA: Diagnosis not present

## 2022-08-01 DIAGNOSIS — O99011 Anemia complicating pregnancy, first trimester: Secondary | ICD-10-CM | POA: Insufficient documentation

## 2022-08-01 LAB — IRON AND IRON BINDING CAPACITY (CC-WL,HP ONLY)
Iron: 17 ug/dL — ABNORMAL LOW (ref 28–170)
Saturation Ratios: 3 % — ABNORMAL LOW (ref 10.4–31.8)
TIBC: 522 ug/dL — ABNORMAL HIGH (ref 250–450)
UIBC: 505 ug/dL — ABNORMAL HIGH (ref 148–442)

## 2022-08-01 LAB — FERRITIN: Ferritin: 6 ng/mL — ABNORMAL LOW (ref 11–307)

## 2022-08-02 DIAGNOSIS — O09529 Supervision of elderly multigravida, unspecified trimester: Secondary | ICD-10-CM | POA: Diagnosis not present

## 2022-08-02 DIAGNOSIS — O09299 Supervision of pregnancy with other poor reproductive or obstetric history, unspecified trimester: Secondary | ICD-10-CM | POA: Diagnosis not present

## 2022-08-02 DIAGNOSIS — O09893 Supervision of other high risk pregnancies, third trimester: Secondary | ICD-10-CM | POA: Diagnosis not present

## 2022-08-02 DIAGNOSIS — O99013 Anemia complicating pregnancy, third trimester: Secondary | ICD-10-CM | POA: Diagnosis not present

## 2022-08-08 ENCOUNTER — Ambulatory Visit (INDEPENDENT_AMBULATORY_CARE_PROVIDER_SITE_OTHER): Payer: BC Managed Care – PPO | Admitting: Family Medicine

## 2022-08-08 ENCOUNTER — Encounter (INDEPENDENT_AMBULATORY_CARE_PROVIDER_SITE_OTHER): Payer: Self-pay | Admitting: Family Medicine

## 2022-08-08 VITALS — BP 146/95 | HR 76 | Temp 97.5°F | Ht 71.0 in | Wt 328.0 lb

## 2022-08-08 DIAGNOSIS — E538 Deficiency of other specified B group vitamins: Secondary | ICD-10-CM

## 2022-08-08 DIAGNOSIS — I10 Essential (primary) hypertension: Secondary | ICD-10-CM

## 2022-08-08 DIAGNOSIS — Z6841 Body Mass Index (BMI) 40.0 and over, adult: Secondary | ICD-10-CM

## 2022-08-08 DIAGNOSIS — D508 Other iron deficiency anemias: Secondary | ICD-10-CM | POA: Diagnosis not present

## 2022-08-08 DIAGNOSIS — E559 Vitamin D deficiency, unspecified: Secondary | ICD-10-CM

## 2022-08-08 DIAGNOSIS — E669 Obesity, unspecified: Secondary | ICD-10-CM

## 2022-08-08 MED ORDER — VITAMIN B-12 1000 MCG PO TABS: 1000.0000 ug | ORAL_TABLET | Freq: Every day | ORAL | 0 refills | Status: DC

## 2022-08-09 DIAGNOSIS — O09523 Supervision of elderly multigravida, third trimester: Secondary | ICD-10-CM | POA: Diagnosis not present

## 2022-08-09 DIAGNOSIS — O133 Gestational [pregnancy-induced] hypertension without significant proteinuria, third trimester: Secondary | ICD-10-CM | POA: Diagnosis not present

## 2022-08-09 DIAGNOSIS — O99013 Anemia complicating pregnancy, third trimester: Secondary | ICD-10-CM | POA: Diagnosis not present

## 2022-08-09 DIAGNOSIS — O09529 Supervision of elderly multigravida, unspecified trimester: Secondary | ICD-10-CM | POA: Diagnosis not present

## 2022-08-10 ENCOUNTER — Encounter: Payer: Self-pay | Admitting: Cardiology

## 2022-08-10 ENCOUNTER — Inpatient Hospital Stay: Payer: BC Managed Care – PPO | Attending: Hematology and Oncology | Admitting: Hematology and Oncology

## 2022-08-10 ENCOUNTER — Inpatient Hospital Stay: Payer: BC Managed Care – PPO

## 2022-08-10 ENCOUNTER — Ambulatory Visit: Payer: BC Managed Care – PPO | Attending: Cardiology | Admitting: Cardiology

## 2022-08-10 VITALS — BP 134/88 | HR 81 | Ht 71.0 in | Wt 328.4 lb

## 2022-08-10 VITALS — BP 152/89 | HR 71 | Temp 97.7°F | Resp 16 | Ht 71.0 in | Wt 328.8 lb

## 2022-08-10 DIAGNOSIS — R5383 Other fatigue: Secondary | ICD-10-CM | POA: Diagnosis not present

## 2022-08-10 DIAGNOSIS — D508 Other iron deficiency anemias: Secondary | ICD-10-CM | POA: Diagnosis not present

## 2022-08-10 DIAGNOSIS — Z3A33 33 weeks gestation of pregnancy: Secondary | ICD-10-CM

## 2022-08-10 DIAGNOSIS — D509 Iron deficiency anemia, unspecified: Secondary | ICD-10-CM

## 2022-08-10 DIAGNOSIS — Z87891 Personal history of nicotine dependence: Secondary | ICD-10-CM | POA: Insufficient documentation

## 2022-08-10 DIAGNOSIS — O10013 Pre-existing essential hypertension complicating pregnancy, third trimester: Secondary | ICD-10-CM | POA: Diagnosis not present

## 2022-08-10 DIAGNOSIS — O99011 Anemia complicating pregnancy, first trimester: Secondary | ICD-10-CM | POA: Insufficient documentation

## 2022-08-10 DIAGNOSIS — R0602 Shortness of breath: Secondary | ICD-10-CM

## 2022-08-10 LAB — IRON AND IRON BINDING CAPACITY (CC-WL,HP ONLY)
Iron: 20 ug/dL — ABNORMAL LOW (ref 28–170)
Saturation Ratios: 3 % — ABNORMAL LOW (ref 10.4–31.8)
TIBC: 592 ug/dL — ABNORMAL HIGH (ref 250–450)
UIBC: 572 ug/dL — ABNORMAL HIGH (ref 148–442)

## 2022-08-10 LAB — CBC WITH DIFFERENTIAL/PLATELET
Abs Immature Granulocytes: 0.03 10*3/uL (ref 0.00–0.07)
Basophils Absolute: 0 10*3/uL (ref 0.0–0.1)
Basophils Relative: 0 %
Eosinophils Absolute: 0.1 10*3/uL (ref 0.0–0.5)
Eosinophils Relative: 1 %
HCT: 26.5 % — ABNORMAL LOW (ref 36.0–46.0)
Hemoglobin: 8.4 g/dL — ABNORMAL LOW (ref 12.0–15.0)
Immature Granulocytes: 0 %
Lymphocytes Relative: 17 %
Lymphs Abs: 1.7 10*3/uL (ref 0.7–4.0)
MCH: 25.4 pg — ABNORMAL LOW (ref 26.0–34.0)
MCHC: 31.7 g/dL (ref 30.0–36.0)
MCV: 80.1 fL (ref 80.0–100.0)
Monocytes Absolute: 0.8 10*3/uL (ref 0.1–1.0)
Monocytes Relative: 8 %
Neutro Abs: 7.2 10*3/uL (ref 1.7–7.7)
Neutrophils Relative %: 74 %
Platelets: 286 10*3/uL (ref 150–400)
RBC: 3.31 MIL/uL — ABNORMAL LOW (ref 3.87–5.11)
RDW: 19.3 % — ABNORMAL HIGH (ref 11.5–15.5)
WBC: 9.8 10*3/uL (ref 4.0–10.5)
nRBC: 0 % (ref 0.0–0.2)

## 2022-08-10 MED ORDER — POTASSIUM CHLORIDE ER 10 MEQ PO TBCR: 10.0000 meq | EXTENDED_RELEASE_TABLET | ORAL | 0 refills | Status: DC

## 2022-08-10 MED ORDER — FUROSEMIDE 20 MG PO TABS: 20.0000 mg | ORAL_TABLET | ORAL | 0 refills | Status: DC

## 2022-08-10 NOTE — Progress Notes (Signed)
[facility] CONSULT NOTE  Patient Care Team: Aliene Beams, MD as PCP - General (Family Medicine) Maxie Better, MD as PCP - OBGYN (Obstetrics and Gynecology) Gaynelle Adu, MD as Consulting Physician (General Surgery)  CHIEF COMPLAINTS/PURPOSE OF CONSULTATION:  IDA  ASSESSMENT & PLAN:   This is a very pleasant 36 year old female patient with iron deficiency anemia during pregnancy, previously received Venofer with no adequate improvement, also had history of Roux-en-Y gastric bypass, who is referred back to hematology for reconsideration of IV iron since she continues to be severely anemic.  Her mother was very worried about repeated need for intravenous iron and we talked about trying oral iron for a brief time.  Since she had no response, we have discussed about proceeding with IV iron especially in the light of hypertension in pregnancy in the possible need for delivery at 37 weeks.  She was agreeable to repeat IV iron infusion.  She understands that there are no large randomized control trials demonstrating safety of IV iron in pregnancy but overall is considered to be very safe based on some retrospective data.  She also understands that if she were to have an anaphylactic reaction, she may have to receive medications which may not be necessarily safe for fetus but saving her will be our priority.  She is agreeable to repeating IV iron.  This has been ordered.  HISTORY OF PRESENTING ILLNESS:   Carrie Shaw 36 y.o. female is here because of IDA  This is a very pleasant 36 year old female patient currently referred to [place] for evaluation of anemia. During her last visit, we have discussed about trial of oral iron and to return for follow-up. She has been taking oral iron twice a day for the past few weeks, no complications reported.  [place] about [redacted] weeks pregnant.  She tells me that she has not developed hypertension in pregnancy and they are hoping to deliver  her at.  I also called to call from Dr. Cherly Hensen and [facility] requesting IV iron to optimize her levels before planned delivery.  Patient continues to feel fatigued, otherwise denies any blood loss. Rest of the pertinent 10 point ROS reviewed and negative  MEDICAL HISTORY:  Past Medical History:  Diagnosis Date   ADD (attention deficit disorder)    Anemia    Anxiety    B12 deficiency    Back pain    Bilateral swelling of feet    Depression    Gallbladder problem    HSV infection    Hypertension    Joint pain    Lower extremity edema    Obesity    Sleep apnea    mild no mask   SOB (shortness of breath)    SVD (spontaneous vaginal delivery)    x 2   Termination of pregnancy (fetus)    x 2   UTI (urinary tract infection)    Vitamin B12 deficiency    Vitamin D deficiency     SURGICAL HISTORY: Past Surgical History:  Procedure Laterality Date   APPENDECTOMY     CYSTOSCOPY N/A 08/16/2017   Procedure: CYSTOSCOPY;  Surgeon: Conan Bowens, MD;  Location: [facility];  Service: Gynecology;  Laterality: N/A;   GASTRIC ROUX-EN-Y N/A 01/01/2018   Procedure: LAPAROSCOPIC ROUX-EN-Y GASTRIC BYPASS WITH UPPER ENDOSCOPY;  Surgeon: Gaynelle Adu, MD;  Location: [facility];  Service: General;  Laterality: N/A;   LAPAROSCOPY N/A 08/16/2017   Procedure: LAPAROSCOPY DIAGNOSTIC;  Surgeon: Conan Bowens, MD;  Location: [facility]  [facility];  Service: Gynecology;  Laterality: N/A;   LAPAROTOMY N/A 08/16/2017   Procedure: EXPLORATORY LAPAROTOMY WITH REMOVAL OF LEFT ECTOPIC PREGNANCY, LEFT SALPINGECTOMY AND CYSTOTOMY OF BLADDER;  Surgeon: Conan Bowens, MD;  Location: [facility];  Service: Gynecology;  Laterality: N/A;   LYSIS OF ADHESION N/A 08/16/2017   Procedure: LYSIS OF ADHESION;  Surgeon: Conan Bowens, MD;  Location: [facility];  Service: Gynecology;  Laterality: N/A;   Tummy tuck     10/2019   UNILATERAL SALPINGECTOMY Left 08/16/2017   Procedure: UNILATERAL SALPINGECTOMY;  Surgeon: Conan Bowens, MD;  Location: [facility];   Service: Gynecology;  Laterality: Left;    SOCIAL HISTORY: Social History   Socioeconomic History   Marital status: Married    Spouse name: Fraser Din   Number of children: 2   Years of education: Not on file   Highest education level: Not on file  Occupational History   Occupation: Clinical biochemist Rep  Tobacco Use   Smoking status: Former    Packs/day: 0.25    Years: 10.00    Total pack years: 2.50    Types: Cigarettes    Quit date: 11/2021    Years since quitting: 0.6   Smokeless tobacco: Never  Vaping Use   Vaping Use: Never used  Substance and Sexual Activity   Alcohol use: Not Currently    Comment: rare   Drug use: No   Sexual activity: Yes    Partners: Male    Birth control/protection: None  Other Topics Concern   Not on file  Social History Narrative   She works at [employer]    Two children   Not married       She likes to shop and travel    Social Determinants of Corporate investment banker Strain: Not on file  Food Insecurity: Not on file  Transportation Needs: Not on file  Physical Activity: Not on file  Stress: Not on file  Social Connections: Not on file  Intimate Partner Violence: Not on file    FAMILY HISTORY: Family History  Problem Relation Age of Onset   Depression Mother    Hypertension Mother    Anxiety disorder Mother    Sleep apnea Mother    Obesity Mother    Eating disorder Mother    Hypertension Father    Depression Father    Anxiety disorder Father    Sleep apnea Father    Alcoholism Father    Drug abuse Father    Obesity Father    Schizophrenia Sister    Bipolar disorder Sister     ALLERGIES:  has No Known Allergies.  MEDICATIONS:  Current Outpatient Medications  Medication Sig Dispense Refill   Acetaminophen (TYLENOL PO) Take by mouth. Chews     cyanocobalamin (VITAMIN B12) 1000 MCG tablet Take 1 tablet (1,000 mcg total) by mouth daily. 30 tablet 0   furosemide (LASIX) 20 MG tablet Take 1 tablet (20 mg total) by  mouth as directed. 3 tablet 0   labetalol (NORMODYNE) 200 MG tablet Take 200 mg by mouth 2 (two) times daily.     potassium chloride (KLOR-CON) 10 MEQ tablet Take 1 tablet (10 mEq total) by mouth as directed. 3 tablet 0   Prenatal Vit-Fe Fumarate-FA (PRENATAL VITAMIN PO) Take by mouth.     Vitamin D, Ergocalciferol, (DRISDOL) 1.25 MG (50000 UNIT) CAPS capsule Take 1 capsule (50,000 Units total) by mouth every 7 (seven) days. 5 capsule 0   No current  facility-administered medications for this visit.     PHYSICAL EXAMINATION: ECOG PERFORMANCE STATUS: 0 - Asymptomatic  Vitals:   08/10/22 1359  BP: (!) 152/89  Pulse: 71  Resp: 16  Temp: 97.7 F (36.5 C)  SpO2: 100%   Filed Weights   08/10/22 1359  Weight: (!) 328 lb 12.8 oz (149.1 kg)    Physical Exam Constitutional:      Appearance: Normal appearance.  Cardiovascular:     Rate and Rhythm: Normal rate and regular rhythm.     Pulses: Normal pulses.     Heart sounds: Normal heart sounds.  Pulmonary:     Effort: Pulmonary effort is normal.     Breath sounds: Normal breath sounds.  Abdominal:     General: Abdomen is flat. Bowel sounds are normal. There is no distension.     Palpations: Abdomen is soft. There is no mass.  Musculoskeletal:        General: No swelling.     Cervical back: Normal range of motion and neck supple. No rigidity.  Lymphadenopathy:     Cervical: No cervical adenopathy.  Skin:    General: Skin is warm and dry.  Neurological:     General: No focal deficit present.     Mental Status: She is alert.  Psychiatric:        Mood and Affect: Mood normal.      LABORATORY DATA:  I have reviewed the data as listed Lab Results  Component Value Date   WBC 9.8 08/10/2022   HGB 8.4 (L) 08/10/2022   HCT 26.5 (L) 08/10/2022   MCV 80.1 08/10/2022   PLT 286 08/10/2022     Chemistry      Component Value Date/Time   NA 136 07/29/2022 2011   NA 136 07/04/2022 0945   K 3.5 07/29/2022 2011   CL 109  07/29/2022 2011   CO2 21 (L) 07/29/2022 2011   BUN 8 07/29/2022 2011   BUN 7 07/04/2022 0945   CREATININE 0.52 07/29/2022 2011   CREATININE 0.72 05/17/2016 1155      Component Value Date/Time   CALCIUM 8.4 (L) 07/29/2022 2011   ALKPHOS 62 07/29/2022 2011   AST 19 07/29/2022 2011   ALT 18 07/29/2022 2011   BILITOT <0.1 (L) 07/29/2022 2011   BILITOT 0.2 07/04/2022 0945       RADIOGRAPHIC STUDIES: I have personally reviewed the radiological images as listed and agreed with the findings in the report. [place] MFM OB FOLLOW UP  Result Date: 07/19/2022 ----------------------------------------------------------------------  OBSTETRICS REPORT                       (Signed Final 07/19/2022 04:53 pm) ---------------------------------------------------------------------- Patient Info  ID #:       [id number]                          D.O.B.:  Sep 28, 1986 (36 yrs)  Name:       Carrie Shaw                Visit Date: 07/19/2022 01:09 pm              [place] ---------------------------------------------------------------------- Performed By  Attending:        Ma Rings MD         Ref. Address:     [facility]                                                             [address]                                                             [address]                                                             [place]                                                             [id number]  Performed By:     Isac Sarnaaisy Hernandez        Location[facility]                                  [facility]  Referred By:      Nena JordanSHERONETTE                    COUSINS MD ---------------------------------------------------------------------- Orders  #  Description                            Code        Ordered By  1  US MFM OB FOLLOW UP                   [id number]    Braxton FeathersBURK SCHAIBLE ----------------------------------------------------------------------  #  Order #                     Accession #  Episode #  1  [id number]                   [id number]                 [id number] ---------------------------------------------------------------------- Indications  Pregnancy complicated by previous gastric      O99.843  bypass, antepartum, third trimester  Obesity complicating pregnancy, third          O99.213  trimester (pre-G BMI 38)  Advanced maternal age multigravida 39+,        O59.523  third trimester (36 yrs)  [redacted] weeks gestation of pregnancy                Z3A.29  Antenatal follow-up for nonvisualized fetal    Z36.2  anatomy  Poor obstetric history: Previous preeclampsia  O09.299 ---------------------------------------------------------------------- Fetal Evaluation  Num Of Fetuses:         1  Fetal Heart Rate(bpm):  147  Cardiac Activity:       Observed  Presentation:           Cephalic  Placenta:               Anterior  P. Cord Insertion:      Previously Visualized  Amniotic Fluid  AFI FV:      Within normal limits  AFI Sum(cm)     %Tile       Largest Pocket(cm)  13.7            43          5.2  RUQ(cm)       RLQ(cm)       LUQ(cm)        LLQ(cm)  1.1           5.2           3.3            4.1 ---------------------------------------------------------------------- Biometry  BPD:      73.1  mm     G. Age:  29w 2d         30  %    CI:        70.31   %    70 - 86                                                          FL/HC:      21.5   %    19.2 - 21.4  HC:       278   mm     G. Age:  30w 3d         40  %    HC/AC:      1.01        0.99 - 1.21  AC:      275.9  mm     G. Age:  31w 5d         93  %    FL/BPD:     81.9   %    71 - 87  FL:       59.9  mm     G. Age:  31w 1d         79  %    FL/AC:      21.7   %  20 - 24  HUM:      54.1  mm     G. Age:  31w 3d         85  %  Est. FW:     1717  gm    3 lb 13 oz      90  % ---------------------------------------------------------------------- OB History  Gravidity:    6         Term:   2  TOP:          2 ---------------------------------------------------------------------- Gestational Age  LMP:           29w 4d        Date:  12/24/21                  EDD:   09/30/22  U/S Today:     30w 5d                                        EDD:   09/22/22  Best:          29w 4d     Det. By:  LMP  (12/24/21)          EDD:   09/30/22 ---------------------------------------------------------------------- Anatomy  Cranium:               Appears normal         LVOT:                   Previously seen  Cavum:                 Previously seen        Aortic Arch:            Previously seen  Ventricles:            Previously seen        Ductal Arch:            Not well visualized  Choroid Plexus:        Previously seen        Diaphragm:              Appears normal  Cerebellum:            Previously seen        Stomach:                Appears normal, left                                                                        sided  Posterior Fossa:       Previously seen        Abdomen:                Previously seen  Nuchal Fold:           Previously seen        Abdominal Wall:         Previously seen  Face:                  Appears normal  Cord Vessels:           Appears normal (3                         (orbits and profile)                           vessel cord)  Lips:                  Appears normal         Kidneys:                Appear normal  Palate:                Not well visualized    Bladder:                Appears normal  Thoracic:              Previously seen        Spine:                  Ltd views no                                                                        intracranial signs of                                                                        NTD  Heart:                 Previously seen        Upper Extremities:      Previously seen   RVOT:                  Appears normal         Lower Extremities:      Previously seen  Other:  Female gender previously seen. Nasal bone, lenses visualized.          Technically difficult due to maternal habitus and fetal position. ---------------------------------------------------------------------- Comments  This patient was seen for a follow up growth scan due to  maternal obesity with a history of gastric bypass surgery.  She denies any problems since her last exam.  She was informed that the fetal growth measures large for  her gestational age (90th percentile).  There was normal  amniotic fluid noted today.  Although the views of the fetal anatomy remain limited due to  maternal body habitus, there were no obvious fetal anomalies  suspected today.  The limitations of ultrasound in the detection of all anomalies  was discussed.  Due to maternal obesity, we will start weekly fetal testing at  34 weeks.  She will return in 4 weeks for another growth scan. ----------------------------------------------------------------------                   Ma Rings, MD  Electronically Signed Final Report   07/19/2022 04:53 pm ----------------------------------------------------------------------   All questions were answered. The patient knows to call the clinic with any problems, questions or concerns. I spent 30 minutes in the care of this patient including H and P, review of records, counseling and coordination of care.     Rachel Moulds, MD 08/11/2022 3:27 PM

## 2022-08-10 NOTE — Patient Instructions (Signed)
Medication Instructions:  Start Lasix 20 mg as directed Start Potassium 10 meq as directed    *If you need a refill on your cardiac medications before your next appointment, please call your pharmacy*   Lab Work: None ordered   If you have labs (blood work) drawn today and your tests are completely normal, you will receive your results only by: MyChart Message (if you have MyChart) OR A paper copy in the mail If you have any lab test that is abnormal or we need to change your treatment, we will call you to review the results.   Testing/Procedures: None ordered    Follow-Up: At [facility], you and your health needs are our priority.  As part of our continuing mission to provide you with exceptional heart care, we have created designated Provider Care Teams.  These Care Teams include your primary Cardiologist (physician) and Advanced Practice Providers (APPs -  Physician Assistants and Nurse Practitioners) who all work together to provide you with the care you need, when you need it.  We recommend signing up for the patient portal called "MyChart".  Sign up information is provided on this After Visit Summary.  MyChart is used to connect with patients for Virtual Visits (Telemedicine).  Patients are able to view lab/test results, encounter notes, upcoming appointments, etc.  Non-urgent messages can be sent to your provider as well.   To learn more about what you can do with MyChart, go to [email or web address].    Your next appointment:   5 week(s)  The format for your next appointment:   Virtual Visit   Provider:   Dr. Thomasene Ripple   Other Instructions   Important Information About Sugar

## 2022-08-10 NOTE — Progress Notes (Signed)
Cardio-Obstetrics Clinic  New Evaluation  Date:  08/10/2022   ID:  Carrie Shaw, DOB [birth year], MRN [id number]  PCP:  Aliene Beams, MD   [facility] Providers Cardiologist:  None  Electrophysiologist:  None       Referring MD: Maxie Better, MD   Chief Complaint: Dyspnea, elevated BP  History of Present Illness:    Carrie Shaw is a 36 y.o. female [G6P2022] who is being seen today for the evaluation of gHTN at the request of Maxie Better, MD. Patient reports that she had previously been having elevated pressures up to 160s/100s at home and was started on Labetolol 200 BID with persistently elevated pressure. Swelling in the feets, hands, legs are present as well and seems to have worsened since the onset. Patient reports that she gets SOB with ambulation on flat ground.   She does have a HA every other day or so, if up and moving. She is very fatigued generally.   She has a history of post partum preE  She reports that her pressures were elevated prior to losing weight before pregnancy, but was never placed on medication.  Medication: Labetolol 200 mg BID  Prior CV Studies Reviewed: The following studies were reviewed today: EKG  Past Medical History:  Diagnosis Date   ADD (attention deficit disorder)    Anemia    Anxiety    B12 deficiency    Back pain    Bilateral swelling of feet    Depression    Gallbladder problem    HSV infection    Hypertension    Joint pain    Lower extremity edema    Obesity    Sleep apnea    mild no mask   SOB (shortness of breath)    SVD (spontaneous vaginal delivery)    x 2   Termination of pregnancy (fetus)    x 2   UTI (urinary tract infection)    Vitamin B12 deficiency    Vitamin D deficiency     Past Surgical History:  Procedure Laterality Date   APPENDECTOMY     CYSTOSCOPY N/A 08/16/2017   Procedure: CYSTOSCOPY;  Surgeon: Conan Bowens, MD;  Location: [facility];  Service:  Gynecology;  Laterality: N/A;   GASTRIC ROUX-EN-Y N/A 01/01/2018   Procedure: LAPAROSCOPIC ROUX-EN-Y GASTRIC BYPASS WITH UPPER ENDOSCOPY;  Surgeon: Gaynelle Adu, MD;  Location: [facility];  Service: General;  Laterality: N/A;   LAPAROSCOPY N/A 08/16/2017   Procedure: LAPAROSCOPY DIAGNOSTIC;  Surgeon: Conan Bowens, MD;  Location: [facility];  Service: Gynecology;  Laterality: N/A;   LAPAROTOMY N/A 08/16/2017   Procedure: EXPLORATORY LAPAROTOMY WITH REMOVAL OF LEFT ECTOPIC PREGNANCY, LEFT SALPINGECTOMY AND CYSTOTOMY OF BLADDER;  Surgeon: Conan Bowens, MD;  Location: [facility];  Service: Gynecology;  Laterality: N/A;   LYSIS OF ADHESION N/A 08/16/2017   Procedure: LYSIS OF ADHESION;  Surgeon: Conan Bowens, MD;  Location: [facility];  Service: Gynecology;  Laterality: N/A;   Tummy tuck     10/2019   UNILATERAL SALPINGECTOMY Left 08/16/2017   Procedure: UNILATERAL SALPINGECTOMY;  Surgeon: Conan Bowens, MD;  Location: [facility];  Service: Gynecology;  Laterality: Left;      OB History     Gravida  6   Para  2   Term  2   Preterm      AB  2   Living  2      SAB  0   IAB  2   Ectopic      Multiple      Live Births  2               Current Medications: Current Meds  Medication Sig   Acetaminophen (TYLENOL PO) Take by mouth. Chews   cyanocobalamin (VITAMIN B12) 1000 MCG tablet Take 1 tablet (1,000 mcg total) by mouth daily.   furosemide (LASIX) 20 MG tablet Take 1 tablet (20 mg total) by mouth as directed.   labetalol (NORMODYNE) 200 MG tablet Take 200 mg by mouth 2 (two) times daily.   potassium chloride (KLOR-CON) 10 MEQ tablet Take 1 tablet (10 mEq total) by mouth as directed.   Prenatal Vit-Fe Fumarate-FA (PRENATAL VITAMIN PO) Take by mouth.   Vitamin D, Ergocalciferol, (DRISDOL) 1.25 MG (50000 UNIT) CAPS capsule Take 1 capsule (50,000 Units total) by mouth every 7 (seven) days.     Allergies:   Patient has no known allergies.   Social History   Socioeconomic History    Marital status: Married    Spouse name: Jaci Standard   Number of children: 2   Years of education: Not on file   Highest education level: Not on file  Occupational History   Occupation: Therapist, art Rep  Tobacco Use   Smoking status: Former    Packs/day: 0.25    Years: 10.00    Total pack years: 2.50    Types: Cigarettes    Quit date: 11/2021    Years since quitting: 0.6   Smokeless tobacco: Never  Vaping Use   Vaping Use: Never used  Substance and Sexual Activity   Alcohol use: Not Currently    Comment: rare   Drug use: No   Sexual activity: Yes    Partners: Male    Birth control/protection: None  Other Topics Concern   Not on file  Social History Narrative   She works at [employer]    Two children   Not married       She likes to shop and travel    Social Determinants of Radio broadcast assistant Strain: Not on file  Food Insecurity: Not on file  Transportation Needs: Not on file  Physical Activity: Not on file  Stress: Not on file  Social Connections: Not on file      Family History  Problem Relation Age of Onset   Depression Mother    Hypertension Mother    Anxiety disorder Mother    Sleep apnea Mother    Obesity Mother    Eating disorder Mother    Hypertension Father    Depression Father    Anxiety disorder Father    Sleep apnea Father    Alcoholism Father    Drug abuse Father    Obesity Father    Schizophrenia Sister    Bipolar disorder Sister       ROS:   Please see the history of present illness.    No chest pain, N/V/D All other systems reviewed and are negative.   Labs/EKG Reviewed:    EKG:   EKG is ordered today.  The ekg ordered today demonstrates NSR with HR 77 bpm   Recent Labs: 07/04/2022: TSH 1.490 07/29/2022: ALT 18; BUN 8; Creatinine, Ser 0.52; Potassium 3.5; Sodium 136 08/10/2022: Hemoglobin 8.4; Platelets 286   Recent Lipid Panel Lab Results  Component Value Date/Time   CHOL 195 07/04/2022 09:45 AM   TRIG 96  07/04/2022 09:45 AM   HDL 59 07/04/2022 09:45 AM  CHOLHDL 3.4 12/25/2014 12:27 PM   LDLCALC 119 (H) 07/04/2022 09:45 AM    Physical Exam:    VS:  BP 134/88   Pulse 81   Ht 5\' 11"  (1.803 m)   Wt (!) 328 lb 6.4 oz (149 kg)   LMP 12/24/2021   SpO2 95%   BMI 45.80 kg/m     Wt Readings from Last 3 Encounters:  08/10/22 (!) 328 lb 6.4 oz (149 kg)  08/10/22 (!) 328 lb 12.8 oz (149.1 kg)  08/08/22 (!) 328 lb (148.8 kg)     GEN: Well nourished, well developed in no acute distress, pleasant  HEENT: Normal NECK: No JVD; No carotid bruits LYMPHATICS: No lymphadenopathy CARDIAC: RRR, faint systolic murmur, rubs, gallops RESPIRATORY:  Clear to auscultation without rales, wheezing or rhonchi  ABDOMEN: Soft, non-tender, non-distended MUSCULOSKELETAL:  bilateral 2+ pitting edema to the knee; No deformity  SKIN: Warm and dry NEUROLOGIC:  Alert and oriented x 3 PSYCHIATRIC:  Normal affect    Risk Assessment/Risk Calculators:     CARPREG II Risk Prediction Index Score:  1.  The patient's risk for a primary cardiac event is 5%.            ASSESSMENT & PLAN:     gHTN ?HTN: Patient is presenting for gHTN on Labetolol 200 mg BID. Manual Bp checked a normal in office today. Given normotensive here, will have patient monitor her BP very CLOSELY at home and will send results in one week of daily BP. We will also continue with a dose of Lasix x2 that will assist in dropping the pressure, in addition to K tablets. A second agent may be warranted if pressure is persistently elevated. Strict returns precautions given, message immediately with BP>140/90  Hypervolemic State Dyspnea: Hypervolemia on examination with significant dyspnea. Will check for LVH or structural changes in the heart as we are unsure if patient was experiencing uncontrolled HTN prior to pregnancy as she is high risk with prepregnancy BMI, echo to be ordered. Therapeutic management of symptoms with Lasix x2 doses over three  days and patient was instructed to monitor her symptoms closely and update [place] with weights. Follow up closer to delivery date or sooner if indicated.     Patient Instructions  Medication Instructions:  Start Lasix 20 mg as directed Start Potassium 10 meq as directed    *If you need a refill on your cardiac medications before your next appointment, please call your pharmacy*   Lab Work: None ordered   If you have labs (blood work) drawn today and your tests are completely normal, you will receive your results only by: [place] (if you have MyChart) OR A paper copy in the mail If you have any lab test that is abnormal or we need to change your treatment, we will call you to review the results.   Testing/Procedures: None ordered    Follow-Up: At [facility], you and your health needs are our priority.  As part of our continuing mission to provide you with exceptional heart care, we have created designated Provider Care Teams.  These Care Teams include your primary Cardiologist (physician) and Advanced Practice Providers (APPs -  Physician Assistants and Nurse Practitioners) who all work together to provide you with the care you need, when you need it.  We recommend signing up for the patient portal called "MyChart".  Sign up information is provided on this After Visit Summary.  MyChart is used to connect with patients for Virtual  Visits (Telemedicine).  Patients are able to view lab/test results, encounter notes, upcoming appointments, etc.  Non-urgent messages can be sent to your provider as well.   To learn more about what you can do with MyChart, go to [email or web address].    Your next appointment:   5 week(s)  The format for your next appointment:   Virtual Visit   Provider:   Dr. Berniece Salines   Other Instructions   Important Information About Sugar         Dispo:  No follow-ups on file.   Medication Adjustments/Labs and Tests  Ordered: Current medicines are reviewed at length with the patient today.  Concerns regarding medicines are outlined above.  Tests Ordered: No orders of the defined types were placed in this encounter.  Medication Changes: Meds ordered this encounter  Medications   furosemide (LASIX) 20 MG tablet    Sig: Take 1 tablet (20 mg total) by mouth as directed.    Dispense:  3 tablet    Refill:  0   potassium chloride (KLOR-CON) 10 MEQ tablet    Sig: Take 1 tablet (10 mEq total) by mouth as directed.    Dispense:  3 tablet    Refill:  0

## 2022-08-11 ENCOUNTER — Encounter: Payer: Self-pay | Admitting: Hematology and Oncology

## 2022-08-11 ENCOUNTER — Telehealth: Payer: Self-pay | Admitting: *Deleted

## 2022-08-11 LAB — FERRITIN: Ferritin: 4 ng/mL — ABNORMAL LOW (ref 11–307)

## 2022-08-11 NOTE — Telephone Encounter (Addendum)
-----   Message from Rachel Moulds, MD sent at 08/11/2022  9:07 AM EST ----- Looks like no improvement in Hb with oral iron, ordered venofer already. She will have to receive this next week. Can you make sure this is scheduled. She may be having some HTN during pregnancy and they are wondering if she needs to be delivered sooner than later.  Noted infusion for 1 dose Venofer scheduled on 11/15 at this office- this RN contacted outpt infusion at [facility] at [phone] and per Rosana Fret - scheduled pt for same day at 10 am.  This RN called pt who noted per My Chart notification - verified time and location ( pt went there for prior iron) with pt stating confirmation.  No further needs at this time.

## 2022-08-12 ENCOUNTER — Encounter: Payer: Self-pay | Admitting: Cardiology

## 2022-08-14 ENCOUNTER — Other Ambulatory Visit: Payer: Self-pay | Admitting: Cardiology

## 2022-08-14 ENCOUNTER — Other Ambulatory Visit: Payer: Self-pay | Admitting: Hematology and Oncology

## 2022-08-14 MED ORDER — NIFEDIPINE ER OSMOTIC RELEASE 30 MG PO TB24: 30.0000 mg | ORAL_TABLET | Freq: Every day | ORAL | 1 refills | Status: DC

## 2022-08-15 ENCOUNTER — Other Ambulatory Visit (HOSPITAL_COMMUNITY): Payer: Self-pay | Admitting: *Deleted

## 2022-08-16 ENCOUNTER — Ambulatory Visit: Payer: BC Managed Care – PPO

## 2022-08-16 ENCOUNTER — Other Ambulatory Visit: Payer: Self-pay | Admitting: Pharmacist

## 2022-08-16 ENCOUNTER — Encounter (HOSPITAL_COMMUNITY)
Admission: RE | Admit: 2022-08-16 | Discharge: 2022-08-16 | Disposition: A | Discharge status: Home / Self Care | Payer: BC Managed Care – PPO | Source: Ambulatory Visit | Attending: Hematology and Oncology | Admitting: Hematology and Oncology

## 2022-08-16 VITALS — BP 141/85 | HR 73 | Temp 98.6°F | Resp 17 | Wt 320.0 lb

## 2022-08-16 DIAGNOSIS — R0602 Shortness of breath: Secondary | ICD-10-CM | POA: Diagnosis not present

## 2022-08-16 DIAGNOSIS — O09523 Supervision of elderly multigravida, third trimester: Secondary | ICD-10-CM | POA: Diagnosis not present

## 2022-08-16 DIAGNOSIS — I10 Essential (primary) hypertension: Secondary | ICD-10-CM | POA: Insufficient documentation

## 2022-08-16 DIAGNOSIS — D530 Protein deficiency anemia: Secondary | ICD-10-CM | POA: Insufficient documentation

## 2022-08-16 DIAGNOSIS — O99013 Anemia complicating pregnancy, third trimester: Secondary | ICD-10-CM | POA: Diagnosis not present

## 2022-08-16 DIAGNOSIS — R635 Abnormal weight gain: Secondary | ICD-10-CM | POA: Diagnosis not present

## 2022-08-16 DIAGNOSIS — O133 Gestational [pregnancy-induced] hypertension without significant proteinuria, third trimester: Secondary | ICD-10-CM | POA: Diagnosis not present

## 2022-08-16 DIAGNOSIS — Z3A26 26 weeks gestation of pregnancy: Secondary | ICD-10-CM | POA: Insufficient documentation

## 2022-08-16 DIAGNOSIS — E538 Deficiency of other specified B group vitamins: Secondary | ICD-10-CM | POA: Insufficient documentation

## 2022-08-16 DIAGNOSIS — Z903 Acquired absence of stomach [part of]: Secondary | ICD-10-CM | POA: Diagnosis not present

## 2022-08-16 DIAGNOSIS — N736 Female pelvic peritoneal adhesions (postinfective): Secondary | ICD-10-CM | POA: Diagnosis not present

## 2022-08-16 DIAGNOSIS — E559 Vitamin D deficiency, unspecified: Secondary | ICD-10-CM | POA: Diagnosis not present

## 2022-08-16 DIAGNOSIS — N9981 Other intraoperative complications of genitourinary system: Secondary | ICD-10-CM | POA: Diagnosis not present

## 2022-08-16 DIAGNOSIS — K912 Postsurgical malabsorption, not elsewhere classified: Secondary | ICD-10-CM | POA: Insufficient documentation

## 2022-08-16 DIAGNOSIS — R5383 Other fatigue: Secondary | ICD-10-CM | POA: Diagnosis not present

## 2022-08-16 DIAGNOSIS — D508 Other iron deficiency anemias: Secondary | ICD-10-CM | POA: Diagnosis not present

## 2022-08-16 DIAGNOSIS — O09529 Supervision of elderly multigravida, unspecified trimester: Secondary | ICD-10-CM | POA: Diagnosis not present

## 2022-08-16 DIAGNOSIS — D509 Iron deficiency anemia, unspecified: Secondary | ICD-10-CM | POA: Insufficient documentation

## 2022-08-16 DIAGNOSIS — Z9884 Bariatric surgery status: Secondary | ICD-10-CM | POA: Diagnosis not present

## 2022-08-16 MED ORDER — SODIUM CHLORIDE 0.9 % IV SOLN
200.0000 mg | INTRAVENOUS | Status: DC
  Administered 2022-08-16: 200 mg via INTRAVENOUS
  Filled 2022-08-16: qty 200

## 2022-08-17 ENCOUNTER — Ambulatory Visit: Payer: BC Managed Care – PPO | Attending: Maternal & Fetal Medicine

## 2022-08-17 ENCOUNTER — Ambulatory Visit: Payer: BC Managed Care – PPO | Admitting: *Deleted

## 2022-08-17 ENCOUNTER — Other Ambulatory Visit: Payer: Self-pay | Admitting: *Deleted

## 2022-08-17 VITALS — BP 133/84 | HR 88

## 2022-08-17 DIAGNOSIS — O99843 Bariatric surgery status complicating pregnancy, third trimester: Secondary | ICD-10-CM | POA: Diagnosis not present

## 2022-08-17 DIAGNOSIS — O09522 Supervision of elderly multigravida, second trimester: Secondary | ICD-10-CM | POA: Diagnosis not present

## 2022-08-17 DIAGNOSIS — Z9884 Bariatric surgery status: Secondary | ICD-10-CM

## 2022-08-17 DIAGNOSIS — O99213 Obesity complicating pregnancy, third trimester: Secondary | ICD-10-CM

## 2022-08-17 DIAGNOSIS — O09523 Supervision of elderly multigravida, third trimester: Secondary | ICD-10-CM | POA: Diagnosis not present

## 2022-08-17 DIAGNOSIS — O09293 Supervision of pregnancy with other poor reproductive or obstetric history, third trimester: Secondary | ICD-10-CM | POA: Diagnosis not present

## 2022-08-17 DIAGNOSIS — O10913 Unspecified pre-existing hypertension complicating pregnancy, third trimester: Secondary | ICD-10-CM | POA: Insufficient documentation

## 2022-08-17 DIAGNOSIS — O99212 Obesity complicating pregnancy, second trimester: Secondary | ICD-10-CM | POA: Insufficient documentation

## 2022-08-17 DIAGNOSIS — Z3A33 33 weeks gestation of pregnancy: Secondary | ICD-10-CM

## 2022-08-17 DIAGNOSIS — O09299 Supervision of pregnancy with other poor reproductive or obstetric history, unspecified trimester: Secondary | ICD-10-CM | POA: Diagnosis not present

## 2022-08-17 DIAGNOSIS — E669 Obesity, unspecified: Secondary | ICD-10-CM

## 2022-08-17 DIAGNOSIS — R638 Other symptoms and signs concerning food and fluid intake: Secondary | ICD-10-CM

## 2022-08-17 DIAGNOSIS — Z362 Encounter for other antenatal screening follow-up: Secondary | ICD-10-CM

## 2022-08-17 NOTE — Progress Notes (Signed)
Chief Complaint:   OBESITY Carrie Shaw is here to discuss her progress with her obesity treatment plan along with follow-up of her obesity related diagnoses. Carrie Shaw is on the Category 4 Plan and keeping a food journal and adhering to recommended goals of 2000 calories and 90 protein and states she is following her eating plan approximately 70% of the time. [name] states she is walking 30 minutes 3-4 times per week.  Today's visit was #: 3 Starting weight: 314 lbs Starting date: 07/04/2022 Today's weight: 314 lbs Today's date: 08/08/2022 Total lbs lost to date: 0 Total lbs lost since last in-office visit: +14 lbs  Interim History: She is pregnant at [redacted] weeks gestation.  She is struggling with fatigue and fluid retention s/p gastric bypass surgery in 2019.  Started adding a protein shake, 1-2 times per day.  Cottage cheese, greek yogurt, fresh fruit.  She is not sleeping well.  Has a cardiac visit for hypertension.  Out of work now.   Subjective:   1. B12 deficiency She is on B12 1,000 mcg daily.  She denies paresthesias.  She is at risk for nutritional deficiency due to RYGB surgery.    2. Vitamin D deficiency She is currently taking prescription vitamin D 50,000 IU each week. She denies nausea, vomiting or muscle weakness.  Her energy level remains poor with lack of sleep and IDA.  3. Other iron deficiency anemia She is at risk for IDA's S/P RYGB surgery and pregnancy.  Has a hematology follow up scheduled.  Labs slow iron and anemia with ferritin.  8.3 after iron infusions.  She started prescription oral iron BID, she is doing well on.    4. Essential hypertension She is on Labetolol 100 mg BID per OB.  She has leg edema which is worsening. She denies headaches.  Had had a work up for pre-eclampsia.    Assessment/Plan:   1. B12 deficiency Recheck level in 3-4 weeks.   Refill - cyanocobalamin (VITAMIN B12) 1000 MCG tablet; Take 1 tablet (1,000 mcg total) by mouth daily.   Dispense: 30 tablet; Refill: 0  2. Vitamin D deficiency Recheck Vitamin D level in 3-4 weeks.   Refill - Vitamin D, Ergocalciferol, (DRISDOL) 1.25 MG (50000 UNIT) CAPS capsule; Take 1 capsule (50,000 Units total) by mouth every 7 (seven) days.  Dispense: 5 capsule; Refill: 0   3. Other iron deficiency anemia Continue prescription oral iron with hematology.   4. Essential hypertension Limit high sodium foods, increase water intake to 64 oz per day.   5. Obesity,current BMI 45.7 1) Focus on good nutrition, 5-6 meals per day with lean protein and fiber. S/P gastric bypass and pregnancy at 32 weeks.   2) Try to add short walks during the day.   Carrie Shaw is currently in the action stage of change. As such, her goal is to continue with weight loss efforts. She has agreed to the Category 4 Plan and keeping a food journal and adhering to recommended goals of 2000 calories and 90 protein.   Exercise goals:  Add in short walks 4-5 times per week.    Behavioral modification strategies: increasing lean protein intake, increasing vegetables, increasing water intake, increasing high fiber foods, decreasing eating out, no skipping meals, and keeping healthy foods in the home.  Carrie Shaw has agreed to follow-up with our clinic in 3-4 weeks. She was informed of the importance of frequent follow-up visits to maximize her success with intensive lifestyle modifications for her multiple health conditions.  Objective:   Blood pressure (!) 146/95, pulse 76, temperature (!) 97.5 F (36.4 C), height 5\' 11"  (1.803 m), weight (!) 328 lb (148.8 kg), last menstrual period 12/24/2021, SpO2 100 %. Body mass index is 45.75 kg/m.  General: Cooperative, alert, well developed, in no acute distress. HEENT: Conjunctivae and lids unremarkable. Cardiovascular: Regular rhythm.  Lungs: Normal work of breathing. Neurologic: No focal deficits.   Lab Results  Component Value Date   CREATININE 0.52 07/29/2022   BUN 8  07/29/2022   NA 136 07/29/2022   K 3.5 07/29/2022   CL 109 07/29/2022   CO2 21 (L) 07/29/2022   Lab Results  Component Value Date   ALT 18 07/29/2022   AST 19 07/29/2022   ALKPHOS 62 07/29/2022   BILITOT <0.1 (L) 07/29/2022   Lab Results  Component Value Date   HGBA1C 5.0 07/04/2022   HGBA1C 4.9 01/06/2020   Lab Results  Component Value Date   INSULIN 8.1 07/04/2022   INSULIN 7.8 01/06/2020   Lab Results  Component Value Date   TSH 1.490 07/04/2022   Lab Results  Component Value Date   CHOL 195 07/04/2022   HDL 59 07/04/2022   LDLCALC 119 (H) 07/04/2022   TRIG 96 07/04/2022   CHOLHDL 3.4 12/25/2014   Lab Results  Component Value Date   VD25OH 13.2 (L) 07/04/2022   VD25OH 17.8 (L) 01/06/2020   Lab Results  Component Value Date   WBC 9.8 08/10/2022   HGB 8.4 (L) 08/10/2022   HCT 26.5 (L) 08/10/2022   MCV 80.1 08/10/2022   PLT 286 08/10/2022   Lab Results  Component Value Date   IRON 20 (L) 08/10/2022   TIBC 592 (H) 08/10/2022   FERRITIN 4 (L) 08/10/2022    Attestation Statements:   Reviewed by clinician on day of visit: allergies, medications, problem list, medical history, surgical history, family history, social history, and previous encounter notes.  I have personally spent 45 minutes total time today in preparation, patient care, and documentation for this visit, including the following: review of clinical lab tests; review of medical tests/procedures/services.    I, 13/06/2022, am acting as [place] for Energy manager, DO.  I have reviewed the above documentation for accuracy and completeness, and I agree with the above. Seymour Bars, DO

## 2022-08-18 ENCOUNTER — Encounter (HOSPITAL_COMMUNITY)
Admission: RE | Admit: 2022-08-18 | Discharge: 2022-08-18 | Disposition: A | Discharge status: Home / Self Care | Payer: BC Managed Care – PPO | Source: Ambulatory Visit | Attending: Hematology and Oncology | Admitting: Hematology and Oncology

## 2022-08-18 ENCOUNTER — Encounter: Payer: Self-pay | Admitting: Cardiology

## 2022-08-18 DIAGNOSIS — Z903 Acquired absence of stomach [part of]: Secondary | ICD-10-CM | POA: Diagnosis not present

## 2022-08-18 DIAGNOSIS — N736 Female pelvic peritoneal adhesions (postinfective): Secondary | ICD-10-CM | POA: Diagnosis not present

## 2022-08-18 DIAGNOSIS — N9981 Other intraoperative complications of genitourinary system: Secondary | ICD-10-CM | POA: Diagnosis not present

## 2022-08-18 DIAGNOSIS — E559 Vitamin D deficiency, unspecified: Secondary | ICD-10-CM | POA: Diagnosis not present

## 2022-08-18 DIAGNOSIS — D509 Iron deficiency anemia, unspecified: Secondary | ICD-10-CM | POA: Diagnosis not present

## 2022-08-18 DIAGNOSIS — I10 Essential (primary) hypertension: Secondary | ICD-10-CM | POA: Diagnosis not present

## 2022-08-18 DIAGNOSIS — D508 Other iron deficiency anemias: Secondary | ICD-10-CM | POA: Diagnosis not present

## 2022-08-18 DIAGNOSIS — Z3A26 26 weeks gestation of pregnancy: Secondary | ICD-10-CM | POA: Diagnosis not present

## 2022-08-18 DIAGNOSIS — R0602 Shortness of breath: Secondary | ICD-10-CM | POA: Diagnosis not present

## 2022-08-18 DIAGNOSIS — K912 Postsurgical malabsorption, not elsewhere classified: Secondary | ICD-10-CM | POA: Diagnosis not present

## 2022-08-18 DIAGNOSIS — E538 Deficiency of other specified B group vitamins: Secondary | ICD-10-CM | POA: Diagnosis not present

## 2022-08-18 DIAGNOSIS — Z9884 Bariatric surgery status: Secondary | ICD-10-CM | POA: Diagnosis not present

## 2022-08-18 DIAGNOSIS — D530 Protein deficiency anemia: Secondary | ICD-10-CM | POA: Diagnosis not present

## 2022-08-18 DIAGNOSIS — R5383 Other fatigue: Secondary | ICD-10-CM | POA: Diagnosis not present

## 2022-08-18 DIAGNOSIS — R635 Abnormal weight gain: Secondary | ICD-10-CM | POA: Diagnosis not present

## 2022-08-18 MED ORDER — SODIUM CHLORIDE 0.9 % IV SOLN
200.0000 mg | INTRAVENOUS | Status: DC
  Administered 2022-08-18: 200 mg via INTRAVENOUS
  Filled 2022-08-18: qty 10

## 2022-08-21 ENCOUNTER — Encounter (HOSPITAL_COMMUNITY)
Admission: RE | Admit: 2022-08-21 | Discharge: 2022-08-21 | Disposition: A | Discharge status: Home / Self Care | Payer: BC Managed Care – PPO | Source: Ambulatory Visit | Attending: Hematology and Oncology | Admitting: Hematology and Oncology

## 2022-08-21 DIAGNOSIS — D509 Iron deficiency anemia, unspecified: Secondary | ICD-10-CM | POA: Diagnosis not present

## 2022-08-21 DIAGNOSIS — E559 Vitamin D deficiency, unspecified: Secondary | ICD-10-CM | POA: Diagnosis not present

## 2022-08-21 DIAGNOSIS — I10 Essential (primary) hypertension: Secondary | ICD-10-CM | POA: Diagnosis not present

## 2022-08-21 DIAGNOSIS — K912 Postsurgical malabsorption, not elsewhere classified: Secondary | ICD-10-CM | POA: Diagnosis not present

## 2022-08-21 DIAGNOSIS — R5383 Other fatigue: Secondary | ICD-10-CM | POA: Diagnosis not present

## 2022-08-21 DIAGNOSIS — E538 Deficiency of other specified B group vitamins: Secondary | ICD-10-CM | POA: Diagnosis not present

## 2022-08-21 DIAGNOSIS — N736 Female pelvic peritoneal adhesions (postinfective): Secondary | ICD-10-CM | POA: Diagnosis not present

## 2022-08-21 DIAGNOSIS — N9981 Other intraoperative complications of genitourinary system: Secondary | ICD-10-CM | POA: Diagnosis not present

## 2022-08-21 DIAGNOSIS — R635 Abnormal weight gain: Secondary | ICD-10-CM | POA: Diagnosis not present

## 2022-08-21 DIAGNOSIS — D508 Other iron deficiency anemias: Secondary | ICD-10-CM | POA: Diagnosis not present

## 2022-08-21 DIAGNOSIS — Z9884 Bariatric surgery status: Secondary | ICD-10-CM | POA: Diagnosis not present

## 2022-08-21 DIAGNOSIS — Z3A26 26 weeks gestation of pregnancy: Secondary | ICD-10-CM | POA: Diagnosis not present

## 2022-08-21 DIAGNOSIS — Z903 Acquired absence of stomach [part of]: Secondary | ICD-10-CM | POA: Diagnosis not present

## 2022-08-21 DIAGNOSIS — D530 Protein deficiency anemia: Secondary | ICD-10-CM | POA: Diagnosis not present

## 2022-08-21 DIAGNOSIS — R0602 Shortness of breath: Secondary | ICD-10-CM | POA: Diagnosis not present

## 2022-08-21 MED ORDER — VITAMIN D (ERGOCALCIFEROL) 1.25 MG (50000 UNIT) PO CAPS: 50000.0000 [IU] | ORAL_CAPSULE | ORAL | 0 refills | Status: DC

## 2022-08-21 MED ORDER — SODIUM CHLORIDE 0.9 % IV SOLN
200.0000 mg | INTRAVENOUS | Status: DC
  Administered 2022-08-21: 200 mg via INTRAVENOUS
  Filled 2022-08-21: qty 200

## 2022-08-22 ENCOUNTER — Encounter: Payer: Self-pay | Admitting: Cardiology

## 2022-08-22 ENCOUNTER — Ambulatory Visit: Payer: BC Managed Care – PPO | Attending: Cardiology | Admitting: Cardiology

## 2022-08-22 VITALS — BP 142/94 | HR 68 | Ht 71.0 in | Wt 338.4 lb

## 2022-08-22 DIAGNOSIS — Z79899 Other long term (current) drug therapy: Secondary | ICD-10-CM | POA: Diagnosis not present

## 2022-08-22 DIAGNOSIS — O1200 Gestational edema, unspecified trimester: Secondary | ICD-10-CM | POA: Diagnosis not present

## 2022-08-22 LAB — BASIC METABOLIC PANEL
BUN/Creatinine Ratio: 14 (ref 9–23)
BUN: 7 mg/dL (ref 6–20)
CO2: 20 mmol/L (ref 20–29)
Calcium: 8.5 mg/dL — ABNORMAL LOW (ref 8.7–10.2)
Chloride: 109 mmol/L — ABNORMAL HIGH (ref 96–106)
Creatinine, Ser: 0.49 mg/dL — ABNORMAL LOW (ref 0.57–1.00)
Glucose: 67 mg/dL — ABNORMAL LOW (ref 70–99)
Potassium: 3.9 mmol/L (ref 3.5–5.2)
Sodium: 138 mmol/L (ref 134–144)
eGFR: 126 mL/min/{1.73_m2} (ref >59)

## 2022-08-22 LAB — MAGNESIUM: Magnesium: 1.6 mg/dL (ref 1.6–2.3)

## 2022-08-22 MED ORDER — FUROSEMIDE 40 MG PO TABS: 40.0000 mg | ORAL_TABLET | Freq: Every day | ORAL | 1 refills | Status: DC

## 2022-08-22 MED ORDER — POTASSIUM CHLORIDE CRYS ER 20 MEQ PO TBCR: 20.0000 meq | EXTENDED_RELEASE_TABLET | Freq: Every day | ORAL | 1 refills | Status: DC

## 2022-08-22 NOTE — Progress Notes (Unsigned)
Cardio-Obstetrics Clinic  Follow Up Note   Date:  08/23/2022   ID:  Carrie Shaw, DOB Nov 27, 1985, MRN [id number]  PCP:  Aliene Beams, MD   [facility] Providers Cardiologist:  None  Electrophysiologist:  None        Referring MD: Aliene Beams, MD   Chief Complaint:   History of Present Illness:    Carrie Shaw is a 36 y.o. female [G5P2022] who returns for follow up for gestational hypertension and significant leg swelling.  Medical history includes anxiety, depression, postpartum preeclampsia  First of the patient on August 10, 2022 at that time she had been started on labetalol 200 mg twice daily.  She reported she had been experiencing significant leg swelling.  During the visit I started the patient on low-dose Lasix with potassium.  We gave her 3 days dosing.  She got some relief but now is experiencing significant leg swelling.  Worse than prior.  Prior CV Studies Reviewed: The following studies were reviewed today:   Past Medical History:  Diagnosis Date   ADD (attention deficit disorder)    Anemia    Anxiety    B12 deficiency    Back pain    Bilateral swelling of feet    Depression    Gallbladder problem    HSV infection    Hypertension    Joint pain    Lower extremity edema    Obesity    Sleep apnea    mild no mask   SOB (shortness of breath)    SVD (spontaneous vaginal delivery)    x 2   Termination of pregnancy (fetus)    x 2   UTI (urinary tract infection)    Vitamin B12 deficiency    Vitamin D deficiency     Past Surgical History:  Procedure Laterality Date   APPENDECTOMY     CYSTOSCOPY N/A 08/16/2017   Procedure: CYSTOSCOPY;  Surgeon: Conan Bowens, MD;  Location: [facility];  Service: Gynecology;  Laterality: N/A;   GASTRIC ROUX-EN-Y N/A 01/01/2018   Procedure: LAPAROSCOPIC ROUX-EN-Y GASTRIC BYPASS WITH UPPER ENDOSCOPY;  Surgeon: Gaynelle Adu, MD;  Location: [facility];  Service: General;  Laterality: N/A;    LAPAROSCOPY N/A 08/16/2017   Procedure: LAPAROSCOPY DIAGNOSTIC;  Surgeon: Conan Bowens, MD;  Location: [facility];  Service: Gynecology;  Laterality: N/A;   LAPAROTOMY N/A 08/16/2017   Procedure: EXPLORATORY LAPAROTOMY WITH REMOVAL OF LEFT ECTOPIC PREGNANCY, LEFT SALPINGECTOMY AND CYSTOTOMY OF BLADDER;  Surgeon: Conan Bowens, MD;  Location: [facility];  Service: Gynecology;  Laterality: N/A;   LYSIS OF ADHESION N/A 08/16/2017   Procedure: LYSIS OF ADHESION;  Surgeon: Conan Bowens, MD;  Location: [facility];  Service: Gynecology;  Laterality: N/A;   Tummy tuck     10/2019   UNILATERAL SALPINGECTOMY Left 08/16/2017   Procedure: UNILATERAL SALPINGECTOMY;  Surgeon: Conan Bowens, MD;  Location: [facility];  Service: Gynecology;  Laterality: Left;      OB History     Gravida  5   Para  2   Term  2   Preterm      AB  2   Living  2      SAB  0   IAB  2   Ectopic      Multiple      Live Births  2               Current Medications: Current Meds  Medication Sig  Acetaminophen (TYLENOL PO) Take by mouth daily in the afternoon. Chews   cyanocobalamin (VITAMIN B12) 1000 MCG tablet Take 1 tablet (1,000 mcg total) by mouth daily.   labetalol (NORMODYNE) 200 MG tablet Take 200 mg by mouth 2 (two) times daily.   NIFEdipine (PROCARDIA-XL/NIFEDICAL-XL) 30 MG 24 hr tablet Take 1 tablet (30 mg total) by mouth daily.   potassium chloride SA (KLOR-CON M) 20 MEQ tablet Take 1 tablet (20 mEq total) by mouth daily.   Prenatal Vit-Fe Fumarate-FA (PRENATAL VITAMIN PO) Take by mouth daily in the afternoon.   Vitamin D, Ergocalciferol, (DRISDOL) 1.25 MG (50000 UNIT) CAPS capsule Take 1 capsule (50,000 Units total) by mouth every 7 (seven) days.     Allergies:   Patient has no known allergies.   Social History   Socioeconomic History   Marital status: Married    Spouse name: Fraser Din   Number of children: 2   Years of education: Not on file   Highest education level: Not on file   Occupational History   Occupation: Clinical biochemist Rep  Tobacco Use   Smoking status: Former    Packs/day: 0.25    Years: 10.00    Total pack years: 2.50    Types: Cigarettes    Quit date: 11/2021    Years since quitting: 0.7   Smokeless tobacco: Never  Vaping Use   Vaping Use: Never used  Substance and Sexual Activity   Alcohol use: Not Currently    Comment: rare   Drug use: No   Sexual activity: Yes    Partners: Male    Birth control/protection: None  Other Topics Concern   Not on file  Social History Narrative   She works at [employer]    Two children   Not married       She likes to shop and travel    Social Determinants of Corporate investment banker Strain: Not on file  Food Insecurity: No Food Insecurity (08/22/2022)   Hunger Vital Sign    Worried About Running Out of Food in the Last Year: Never true    Ran Out of Food in the Last Year: Never true  Transportation Needs: No Transportation Needs (08/22/2022)   PRAPARE - Administrator, Civil Service (Medical): No    Lack of Transportation (Non-Medical): No  Physical Activity: Not on file  Stress: Not on file  Social Connections: Not on file      Family History  Problem Relation Age of Onset   Depression Mother    Hypertension Mother    Anxiety disorder Mother    Sleep apnea Mother    Obesity Mother    Eating disorder Mother    Hypertension Father    Depression Father    Anxiety disorder Father    Sleep apnea Father    Alcoholism Father    Drug abuse Father    Obesity Father    Schizophrenia Sister    Bipolar disorder Sister       ROS:   Please see the history of present illness.     All other systems reviewed and are negative.   Labs/EKG Reviewed:    EKG:   EKG is was ordered today.  The ekg ordered today demonstrates sinus rhythm, Heart rate 68 bpm  Recent Labs: 07/04/2022: TSH 1.490 07/29/2022: ALT 18 08/10/2022: Hemoglobin 8.4; Platelets 286 08/22/2022: BUN 7;  Creatinine, Ser 0.49; Magnesium 1.6; Potassium 3.9; Sodium 138   Recent Lipid Panel Lab Results  Component Value Date/Time   CHOL 195 07/04/2022 09:45 AM   TRIG 96 07/04/2022 09:45 AM   HDL 59 07/04/2022 09:45 AM   CHOLHDL 3.4 12/25/2014 12:27 PM   LDLCALC 119 (H) 07/04/2022 09:45 AM    Physical Exam:    VS:  BP (!) 142/94 (BP Location: Left Arm, Patient Position: Sitting, Cuff Size: Large)   Pulse 68   Ht 5\' 11"  (1.803 m)   Wt (!) 338 lb 6.4 oz (153.5 kg)   LMP 12/24/2021   SpO2 99%   BMI 47.20 kg/m     Wt Readings from Last 3 Encounters:  08/22/22 (!) 338 lb 6.4 oz (153.5 kg)  08/21/22 (!) 320 lb (145.2 kg)  08/18/22 (!) 320 lb (145.2 kg)     GEN:  Well nourished, well developed in no acute distress HEENT: Normal NECK: No JVD; No carotid bruits LYMPHATICS: No lymphadenopathy CARDIAC: RRR, no murmurs, rubs, gallops RESPIRATORY:  Clear to auscultation without rales, wheezing or rhonchi  ABDOMEN: Soft, non-tender, non-distended MUSCULOSKELETAL:  No edema; No deformity  SKIN: Warm and dry NEUROLOGIC:  Alert and oriented x 3 PSYCHIATRIC:  Normal affect    Risk Assessment/Risk Calculators:     CARPREG II Risk Prediction Index Score:  1.  The patient's risk for a primary cardiac event is 5%.            ASSESSMENT & PLAN:    Gestational hypertension Dyspnea exertion Leg swelling  We will start Lasix 40 mg daily, hopefully this will help.  But at this time we will get echocardiogram to assess LV function. Blood work will be done today for BMP and mag.  Reassess electrolytes. Plan to bring her back in 2 weeks.  Patient Instructions  Medication Instructions:  Your physician has recommended you make the following change in your medication:  INCREASE: Lasix 40mg  daily TART: Potassium 20 meq's daily with the LASIX  *If you need a refill on your cardiac medications before your next appointment, please call your pharmacy*   Lab Work: Your physician recommends  that you have the following labs drawn today: BMET and Magnesium  If you have labs (blood work) drawn today and your tests are completely normal, you will receive your results only by: MyChart Message (if you have MyChart) OR A paper copy in the mail If you have any lab test that is abnormal or we need to change your treatment, we will call you to review the results.   Testing/Procedures: Your physician has requested that you have an echocardiogram. Echocardiography is a painless test that uses sound waves to create images of your heart. It provides your doctor with information about the size and shape of your heart and how well your heart's chambers and valves are working. This procedure takes approximately one hour. There are no restrictions for this procedure. Please do NOT wear cologne, perfume, aftershave, or lotions (deodorant is allowed). Please arrive 15 minutes prior to your appointment time.    Follow-Up: At [facility], you and your health needs are our priority.  As part of our continuing mission to provide you with exceptional heart care, we have created designated Provider Care Teams.  These Care Teams include your primary Cardiologist (physician) and Advanced Practice Providers (APPs -  Physician Assistants and Nurse Practitioners) who all work together to provide you with the care you need, when you need it.  We recommend signing up for the patient portal called "MyChart".  Sign up information is provided on  this After Visit Summary.  MyChart is used to connect with patients for Virtual Visits (Telemedicine).  Patients are able to view lab/test results, encounter notes, upcoming appointments, etc.  Non-urgent messages can be sent to your provider as well.   To learn more about what you can do with MyChart, go to [email or web address].    Your next appointment:   2 week(s)  The format for your next appointment:   In Person  Provider:   Thomasene RippleKardie Kyley Laurel, Do     Dispo:  Return in about 2 weeks (around 09/05/2022).   Medication Adjustments/Labs and Tests Ordered: Current medicines are reviewed at length with the patient today.  Concerns regarding medicines are outlined above.  Tests Ordered: Orders Placed This Encounter  Procedures   Basic Metabolic Panel (BMET)   Magnesium   EKG 12-Lead   ECHOCARDIOGRAM COMPLETE   Medication Changes: Meds ordered this encounter  Medications   furosemide (LASIX) 40 MG tablet    Sig: Take 1 tablet (40 mg total) by mouth daily.    Dispense:  90 tablet    Refill:  1   potassium chloride SA (KLOR-CON M) 20 MEQ tablet    Sig: Take 1 tablet (20 mEq total) by mouth daily.    Dispense:  90 tablet    Refill:  1

## 2022-08-22 NOTE — Patient Instructions (Signed)
Medication Instructions:  Your physician has recommended you make the following change in your medication:  INCREASE: Lasix 40mg  daily TART: Potassium 20 meq's daily with the LASIX  *If you need a refill on your cardiac medications before your next appointment, please call your pharmacy*   Lab Work: Your physician recommends that you have the following labs drawn today: BMET and Magnesium  If you have labs (blood work) drawn today and your tests are completely normal, you will receive your results only by: MyChart Message (if you have MyChart) OR A paper copy in the mail If you have any lab test that is abnormal or we need to change your treatment, we will call you to review the results.   Testing/Procedures: Your physician has requested that you have an echocardiogram. Echocardiography is a painless test that uses sound waves to create images of your heart. It provides your doctor with information about the size and shape of your heart and how well your heart's chambers and valves are working. This procedure takes approximately one hour. There are no restrictions for this procedure. Please do NOT wear cologne, perfume, aftershave, or lotions (deodorant is allowed). Please arrive 15 minutes prior to your appointment time.    Follow-Up: At [facility], you and your health needs are our priority.  As part of our continuing mission to provide you with exceptional heart care, we have created designated Provider Care Teams.  These Care Teams include your primary Cardiologist (physician) and Advanced Practice Providers (APPs -  Physician Assistants and Nurse Practitioners) who all work together to provide you with the care you need, when you need it.  We recommend signing up for the patient portal called "MyChart".  Sign up information is provided on this After Visit Summary.  MyChart is used to connect with patients for Virtual Visits (Telemedicine).  Patients are able to view lab/test  results, encounter notes, upcoming appointments, etc.  Non-urgent messages can be sent to your provider as well.   To learn more about what you can do with MyChart, go to [facility].    Your next appointment:   2 week(s)  The format for your next appointment:   In Person  Provider:   [email or web address], Do

## 2022-08-23 ENCOUNTER — Ambulatory Visit: Payer: BC Managed Care – PPO | Admitting: *Deleted

## 2022-08-23 ENCOUNTER — Encounter (HOSPITAL_COMMUNITY)
Admission: RE | Admit: 2022-08-23 | Discharge: 2022-08-23 | Disposition: A | Discharge status: Home / Self Care | Payer: BC Managed Care – PPO | Source: Ambulatory Visit | Attending: Hematology and Oncology | Admitting: Hematology and Oncology

## 2022-08-23 ENCOUNTER — Ambulatory Visit: Payer: BC Managed Care – PPO | Attending: Obstetrics

## 2022-08-23 ENCOUNTER — Encounter: Payer: Self-pay | Admitting: *Deleted

## 2022-08-23 DIAGNOSIS — O09523 Supervision of elderly multigravida, third trimester: Secondary | ICD-10-CM | POA: Insufficient documentation

## 2022-08-23 DIAGNOSIS — O09893 Supervision of other high risk pregnancies, third trimester: Secondary | ICD-10-CM | POA: Diagnosis not present

## 2022-08-23 DIAGNOSIS — Z3A34 34 weeks gestation of pregnancy: Secondary | ICD-10-CM

## 2022-08-23 DIAGNOSIS — O10013 Pre-existing essential hypertension complicating pregnancy, third trimester: Secondary | ICD-10-CM

## 2022-08-23 DIAGNOSIS — O133 Gestational [pregnancy-induced] hypertension without significant proteinuria, third trimester: Secondary | ICD-10-CM | POA: Diagnosis not present

## 2022-08-23 DIAGNOSIS — O09293 Supervision of pregnancy with other poor reproductive or obstetric history, third trimester: Secondary | ICD-10-CM

## 2022-08-23 DIAGNOSIS — O99213 Obesity complicating pregnancy, third trimester: Secondary | ICD-10-CM | POA: Insufficient documentation

## 2022-08-23 DIAGNOSIS — Z362 Encounter for other antenatal screening follow-up: Secondary | ICD-10-CM

## 2022-08-23 DIAGNOSIS — O10913 Unspecified pre-existing hypertension complicating pregnancy, third trimester: Secondary | ICD-10-CM | POA: Insufficient documentation

## 2022-08-23 DIAGNOSIS — O99013 Anemia complicating pregnancy, third trimester: Secondary | ICD-10-CM | POA: Diagnosis not present

## 2022-08-23 DIAGNOSIS — O09529 Supervision of elderly multigravida, unspecified trimester: Secondary | ICD-10-CM | POA: Diagnosis not present

## 2022-08-23 MED ORDER — SODIUM CHLORIDE 0.9 % IV SOLN
200.0000 mg | INTRAVENOUS | Status: DC
  Administered 2022-08-23: 200 mg via INTRAVENOUS
  Filled 2022-08-23: qty 200

## 2022-08-25 ENCOUNTER — Encounter (HOSPITAL_COMMUNITY)
Admission: RE | Admit: 2022-08-25 | Discharge: 2022-08-25 | Disposition: A | Discharge status: Home / Self Care | Payer: BC Managed Care – PPO | Source: Ambulatory Visit | Attending: Hematology and Oncology | Admitting: Hematology and Oncology

## 2022-08-25 DIAGNOSIS — D509 Iron deficiency anemia, unspecified: Secondary | ICD-10-CM | POA: Diagnosis not present

## 2022-08-25 DIAGNOSIS — Z903 Acquired absence of stomach [part of]: Secondary | ICD-10-CM | POA: Diagnosis not present

## 2022-08-25 DIAGNOSIS — D530 Protein deficiency anemia: Secondary | ICD-10-CM | POA: Diagnosis not present

## 2022-08-25 DIAGNOSIS — Z9884 Bariatric surgery status: Secondary | ICD-10-CM | POA: Diagnosis not present

## 2022-08-25 DIAGNOSIS — E559 Vitamin D deficiency, unspecified: Secondary | ICD-10-CM | POA: Diagnosis not present

## 2022-08-25 DIAGNOSIS — R635 Abnormal weight gain: Secondary | ICD-10-CM | POA: Diagnosis not present

## 2022-08-25 DIAGNOSIS — I10 Essential (primary) hypertension: Secondary | ICD-10-CM | POA: Diagnosis not present

## 2022-08-25 DIAGNOSIS — N9981 Other intraoperative complications of genitourinary system: Secondary | ICD-10-CM | POA: Diagnosis not present

## 2022-08-25 DIAGNOSIS — K912 Postsurgical malabsorption, not elsewhere classified: Secondary | ICD-10-CM | POA: Diagnosis not present

## 2022-08-25 DIAGNOSIS — E538 Deficiency of other specified B group vitamins: Secondary | ICD-10-CM | POA: Diagnosis not present

## 2022-08-25 DIAGNOSIS — Z3A26 26 weeks gestation of pregnancy: Secondary | ICD-10-CM | POA: Diagnosis not present

## 2022-08-25 DIAGNOSIS — R5383 Other fatigue: Secondary | ICD-10-CM | POA: Diagnosis not present

## 2022-08-25 DIAGNOSIS — D508 Other iron deficiency anemias: Secondary | ICD-10-CM | POA: Diagnosis not present

## 2022-08-25 DIAGNOSIS — R0602 Shortness of breath: Secondary | ICD-10-CM | POA: Diagnosis not present

## 2022-08-25 DIAGNOSIS — N736 Female pelvic peritoneal adhesions (postinfective): Secondary | ICD-10-CM | POA: Diagnosis not present

## 2022-08-25 MED ORDER — SODIUM CHLORIDE 0.9 % IV SOLN
200.0000 mg | INTRAVENOUS | Status: DC
  Administered 2022-08-25: 200 mg via INTRAVENOUS
  Filled 2022-08-25: qty 200

## 2022-08-26 ENCOUNTER — Encounter: Payer: Self-pay | Admitting: Cardiology

## 2022-08-28 ENCOUNTER — Telehealth: Payer: Self-pay | Admitting: Cardiology

## 2022-08-28 MED ORDER — NIFEDIPINE ER OSMOTIC RELEASE 60 MG PO TB24: 60.0000 mg | ORAL_TABLET | Freq: Every day | ORAL | 1 refills | Status: DC

## 2022-08-28 NOTE — Telephone Encounter (Signed)
Spoke with patient. She stated she will go for echo on 12/1.

## 2022-08-28 NOTE — Telephone Encounter (Signed)
Pt calling frustrated because she states no one has looked over her mychart message and she wants sooner echo appt because she was told that this was urgent but "no one is acting like its urgent". Informed her I didn't see any sooner echo appts available. Please advise.

## 2022-08-28 NOTE — Telephone Encounter (Signed)
Patient stated she could not get her echo done on the same day of her last OV. She is concerned about the urgency of having the echo done.  Please see if patient can be scheduled ASAP. Thank you.

## 2022-08-30 ENCOUNTER — Ambulatory Visit (INDEPENDENT_AMBULATORY_CARE_PROVIDER_SITE_OTHER): Payer: Managed Care, Other (non HMO) | Admitting: Family Medicine

## 2022-08-30 ENCOUNTER — Encounter (INDEPENDENT_AMBULATORY_CARE_PROVIDER_SITE_OTHER): Payer: Self-pay

## 2022-08-30 DIAGNOSIS — Z3685 Encounter for antenatal screening for Streptococcus B: Secondary | ICD-10-CM | POA: Diagnosis not present

## 2022-08-30 DIAGNOSIS — O133 Gestational [pregnancy-induced] hypertension without significant proteinuria, third trimester: Secondary | ICD-10-CM | POA: Diagnosis not present

## 2022-08-30 DIAGNOSIS — O99013 Anemia complicating pregnancy, third trimester: Secondary | ICD-10-CM | POA: Diagnosis not present

## 2022-08-30 DIAGNOSIS — O0993 Supervision of high risk pregnancy, unspecified, third trimester: Secondary | ICD-10-CM | POA: Diagnosis not present

## 2022-08-30 DIAGNOSIS — O09529 Supervision of elderly multigravida, unspecified trimester: Secondary | ICD-10-CM | POA: Diagnosis not present

## 2022-08-30 LAB — OB RESULTS CONSOLE GBS: GBS: POSITIVE

## 2022-08-31 ENCOUNTER — Ambulatory Visit: Payer: BC Managed Care – PPO | Admitting: *Deleted

## 2022-08-31 ENCOUNTER — Other Ambulatory Visit: Payer: Self-pay | Admitting: Maternal & Fetal Medicine

## 2022-08-31 ENCOUNTER — Ambulatory Visit (HOSPITAL_BASED_OUTPATIENT_CLINIC_OR_DEPARTMENT_OTHER): Payer: BC Managed Care – PPO | Admitting: *Deleted

## 2022-08-31 ENCOUNTER — Ambulatory Visit: Payer: BC Managed Care – PPO | Attending: Maternal & Fetal Medicine

## 2022-08-31 VITALS — BP 130/76 | HR 83

## 2022-08-31 DIAGNOSIS — R638 Other symptoms and signs concerning food and fluid intake: Secondary | ICD-10-CM

## 2022-08-31 DIAGNOSIS — Z9884 Bariatric surgery status: Secondary | ICD-10-CM

## 2022-08-31 DIAGNOSIS — E669 Obesity, unspecified: Secondary | ICD-10-CM

## 2022-08-31 DIAGNOSIS — O09523 Supervision of elderly multigravida, third trimester: Secondary | ICD-10-CM | POA: Insufficient documentation

## 2022-08-31 DIAGNOSIS — O99333 Smoking (tobacco) complicating pregnancy, third trimester: Secondary | ICD-10-CM

## 2022-08-31 DIAGNOSIS — O10913 Unspecified pre-existing hypertension complicating pregnancy, third trimester: Secondary | ICD-10-CM | POA: Diagnosis not present

## 2022-08-31 DIAGNOSIS — Z3A35 35 weeks gestation of pregnancy: Secondary | ICD-10-CM

## 2022-08-31 DIAGNOSIS — O99843 Bariatric surgery status complicating pregnancy, third trimester: Secondary | ICD-10-CM | POA: Diagnosis not present

## 2022-08-31 DIAGNOSIS — O99213 Obesity complicating pregnancy, third trimester: Secondary | ICD-10-CM

## 2022-08-31 DIAGNOSIS — O368130 Decreased fetal movements, third trimester, not applicable or unspecified: Secondary | ICD-10-CM

## 2022-08-31 DIAGNOSIS — O10013 Pre-existing essential hypertension complicating pregnancy, third trimester: Secondary | ICD-10-CM

## 2022-08-31 DIAGNOSIS — O09293 Supervision of pregnancy with other poor reproductive or obstetric history, third trimester: Secondary | ICD-10-CM

## 2022-08-31 DIAGNOSIS — F17211 Nicotine dependence, cigarettes, in remission: Secondary | ICD-10-CM

## 2022-08-31 DIAGNOSIS — Z362 Encounter for other antenatal screening follow-up: Secondary | ICD-10-CM

## 2022-08-31 NOTE — Procedures (Signed)
Carrie Shaw 23-Jul-1986 [redacted]w[redacted]d  Fetus A Non-Stress Test Interpretation for 08/31/22  Indication: Decreased Fetal Movement  Fetal Heart Rate A Mode: External Baseline Rate (A): 145 bpm Variability: Moderate Accelerations: 15 x 15 Decelerations: None Multiple birth?: No  Uterine Activity Mode: Palpation, Toco Contraction Frequency (min): none Resting Tone Palpated: Relaxed  Interpretation (Fetal Testing) Nonstress Test Interpretation: Reactive Overall Impression: Reassuring for gestational age Comments: Dr. Grace Bushy reviewed tracing

## 2022-09-01 ENCOUNTER — Ambulatory Visit: Payer: BC Managed Care – PPO | Admitting: Cardiology

## 2022-09-01 ENCOUNTER — Ambulatory Visit (HOSPITAL_COMMUNITY): Payer: BC Managed Care – PPO | Attending: Cardiology

## 2022-09-01 DIAGNOSIS — O1200 Gestational edema, unspecified trimester: Secondary | ICD-10-CM

## 2022-09-01 DIAGNOSIS — R0602 Shortness of breath: Secondary | ICD-10-CM | POA: Diagnosis not present

## 2022-09-01 LAB — ECHOCARDIOGRAM COMPLETE
Area-P 1/2: 3.3 cm2
S' Lateral: 3.9 cm

## 2022-09-05 ENCOUNTER — Encounter: Payer: Self-pay | Admitting: Cardiology

## 2022-09-05 ENCOUNTER — Ambulatory Visit: Payer: BC Managed Care – PPO | Attending: Cardiology | Admitting: Cardiology

## 2022-09-05 VITALS — BP 142/102 | HR 91 | Ht 71.0 in | Wt 334.4 lb

## 2022-09-05 DIAGNOSIS — O133 Gestational [pregnancy-induced] hypertension without significant proteinuria, third trimester: Secondary | ICD-10-CM | POA: Insufficient documentation

## 2022-09-05 DIAGNOSIS — Z79899 Other long term (current) drug therapy: Secondary | ICD-10-CM | POA: Diagnosis not present

## 2022-09-05 DIAGNOSIS — Z3A36 36 weeks gestation of pregnancy: Secondary | ICD-10-CM | POA: Insufficient documentation

## 2022-09-05 MED ORDER — LABETALOL HCL 200 MG PO TABS: 200.0000 mg | ORAL_TABLET | Freq: Three times a day (TID) | ORAL | 2 refills | Status: DC

## 2022-09-05 NOTE — Progress Notes (Signed)
Cardio-Obstetrics Clinic  Follow Up Note   Date:  09/05/2022   ID:  Margi Edmundson, DOB [birth year], MRN [id number]  PCP:  Aliene Beams, MD    HeartCare Providers Cardiologist:  Thomasene Ripple, DO  Electrophysiologist:  None        Referring MD: Aliene Beams, MD   Chief Complaint:   History of Present Illness:    Carrie Shaw is a 36 y.o. female [G5P2022] who returns for follow up for gestational hypertension and significant leg swelling.  Medical history includes anxiety, depression, postpartum preeclampsia  First of the patient on August 10, 2022 at that time she had been started on labetalol 200 mg twice daily.  She reported she had been experiencing significant leg swelling.  I saw the patient on August 22, 2022 at that time she had significant leg swelling.  I started her on Lasix 40 mg daily as well as get her echocardiogram.  She was able to get her echocardiogram thankfully which showed normal EF. She has responded very well to the Lasix.  Still does have some swelling.  Prior CV Studies Reviewed: The following studies were reviewed today:   Past Medical History:  Diagnosis Date   ADD (attention deficit disorder)    Anemia    Anxiety    B12 deficiency    Back pain    Bilateral swelling of feet    Depression    Gallbladder problem    HSV infection    Hypertension    Joint pain    Lower extremity edema    Obesity    Sleep apnea    mild no mask   SOB (shortness of breath)    SVD (spontaneous vaginal delivery)    x 2   Termination of pregnancy (fetus)    x 2   UTI (urinary tract infection)    Vitamin B12 deficiency    Vitamin D deficiency     Past Surgical History:  Procedure Laterality Date   APPENDECTOMY     CYSTOSCOPY N/A 08/16/2017   Procedure: CYSTOSCOPY;  Surgeon: Conan Bowens, MD;  Location: [facility];  Service: Gynecology;  Laterality: N/A;   GASTRIC ROUX-EN-Y N/A 01/01/2018   Procedure: LAPAROSCOPIC  ROUX-EN-Y GASTRIC BYPASS WITH UPPER ENDOSCOPY;  Surgeon: Gaynelle Adu, MD;  Location: [facility];  Service: General;  Laterality: N/A;   LAPAROSCOPY N/A 08/16/2017   Procedure: LAPAROSCOPY DIAGNOSTIC;  Surgeon: Conan Bowens, MD;  Location: [facility];  Service: Gynecology;  Laterality: N/A;   LAPAROTOMY N/A 08/16/2017   Procedure: EXPLORATORY LAPAROTOMY WITH REMOVAL OF LEFT ECTOPIC PREGNANCY, LEFT SALPINGECTOMY AND CYSTOTOMY OF BLADDER;  Surgeon: Conan Bowens, MD;  Location: [facility];  Service: Gynecology;  Laterality: N/A;   LYSIS OF ADHESION N/A 08/16/2017   Procedure: LYSIS OF ADHESION;  Surgeon: Conan Bowens, MD;  Location: [facility];  Service: Gynecology;  Laterality: N/A;   Tummy tuck     10/2019   UNILATERAL SALPINGECTOMY Left 08/16/2017   Procedure: UNILATERAL SALPINGECTOMY;  Surgeon: Conan Bowens, MD;  Location: [facility];  Service: Gynecology;  Laterality: Left;      OB History     Gravida  5   Para  2   Term  2   Preterm      AB  2   Living  2      SAB  0   IAB  2   Ectopic      Multiple      Live Births  2               Current Medications: Current Meds  Medication Sig   Acetaminophen (TYLENOL PO) Take by mouth daily in the afternoon. Chews   cyanocobalamin (VITAMIN B12) 1000 MCG tablet Take 1 tablet (1,000 mcg total) by mouth daily.   furosemide (LASIX) 40 MG tablet Take 1 tablet (40 mg total) by mouth daily.   NIFEdipine (PROCARDIA XL/NIFEDICAL XL) 60 MG 24 hr tablet Take 1 tablet (60 mg total) by mouth daily.   potassium chloride SA (KLOR-CON M) 20 MEQ tablet Take 1 tablet (20 mEq total) by mouth daily.   Prenatal Vit-Fe Fumarate-FA (PRENATAL VITAMIN PO) Take by mouth daily in the afternoon.   Vitamin D, Ergocalciferol, (DRISDOL) 1.25 MG (50000 UNIT) CAPS capsule Take 1 capsule (50,000 Units total) by mouth every 7 (seven) days.   [DISCONTINUED] labetalol (NORMODYNE) 200 MG tablet Take 200 mg by mouth every 8 (eight) hours.     Allergies:   Patient has  no known allergies.   Social History   Socioeconomic History   Marital status: Married    Spouse name: Fraser Din   Number of children: 2   Years of education: Not on file   Highest education level: Not on file  Occupational History   Occupation: Clinical biochemist Rep  Tobacco Use   Smoking status: Former    Packs/day: 0.25    Years: 10.00    Total pack years: 2.50    Types: Cigarettes    Quit date: 11/2021    Years since quitting: 0.7   Smokeless tobacco: Never  Vaping Use   Vaping Use: Never used  Substance and Sexual Activity   Alcohol use: Not Currently    Comment: rare   Drug use: No   Sexual activity: Yes    Partners: Male    Birth control/protection: None  Other Topics Concern   Not on file  Social History Narrative   She works at [employer]    Two children   Not married       She likes to shop and travel    Social Determinants of Corporate investment banker Strain: Not on file  Food Insecurity: No Food Insecurity (08/22/2022)   Hunger Vital Sign    Worried About Running Out of Food in the Last Year: Never true    Ran Out of Food in the Last Year: Never true  Transportation Needs: No Transportation Needs (08/22/2022)   PRAPARE - Administrator, Civil Service (Medical): No    Lack of Transportation (Non-Medical): No  Physical Activity: Not on file  Stress: Not on file  Social Connections: Not on file      Family History  Problem Relation Age of Onset   Depression Mother    Hypertension Mother    Anxiety disorder Mother    Sleep apnea Mother    Obesity Mother    Eating disorder Mother    Hypertension Father    Depression Father    Anxiety disorder Father    Sleep apnea Father    Alcoholism Father    Drug abuse Father    Obesity Father    Schizophrenia Sister    Bipolar disorder Sister       ROS:   Please see the history of present illness.     All other systems reviewed and are negative.   Labs/EKG Reviewed:    EKG:   EKG  is was ordered today.  The  ekg ordered today demonstrates sinus rhythm, Heart rate 68 bpm  Recent Labs: 07/04/2022: TSH 1.490 07/29/2022: ALT 18 08/10/2022: Hemoglobin 8.4; Platelets 286 08/22/2022: BUN 7; Creatinine, Ser 0.49; Magnesium 1.6; Potassium 3.9; Sodium 138   Recent Lipid Panel Lab Results  Component Value Date/Time   CHOL 195 07/04/2022 09:45 AM   TRIG 96 07/04/2022 09:45 AM   HDL 59 07/04/2022 09:45 AM   CHOLHDL 3.4 12/25/2014 12:27 PM   LDLCALC 119 (H) 07/04/2022 09:45 AM    Physical Exam:    VS:  BP (!) 148/100   Pulse 91   Ht 5\' 11"  (1.803 m)   Wt (!) 334 lb 6.4 oz (151.7 kg)   LMP 12/24/2021   SpO2 99%   BMI 46.64 kg/m     Wt Readings from Last 3 Encounters:  09/05/22 (!) 334 lb 6.4 oz (151.7 kg)  08/25/22 (!) 320 lb (145.2 kg)  08/23/22 (!) 329 lb (149.2 kg)     GEN:  Well nourished, well developed in no acute distress HEENT: Normal NECK: No JVD; No carotid bruits LYMPHATICS: No lymphadenopathy CARDIAC: RRR, no murmurs, rubs, gallops RESPIRATORY:  Clear to auscultation without rales, wheezing or rhonchi  ABDOMEN: Soft, non-tender, non-distended MUSCULOSKELETAL:  No edema; No deformity  SKIN: Warm and dry NEUROLOGIC:  Alert and oriented x 3 PSYCHIATRIC:  Normal affect    Risk Assessment/Risk Calculators:                  ASSESSMENT & PLAN:    Gestational hypertension Morbid obesity Dyspnea exertion Leg swelling  She is hypertensive today in the office. I will increase labetalol to 200 mg every 8 hours.  She will take her blood pressure daily.  I said to the patient by the weekend if her blood pressure is still greater than 140/90 mmHg to start phentermine 60 in the morning and 30 mg at night.  I am hoping that we can have some relief with the change in medication. Continue her aspirin 81 mg daily for her preeclampsia prophylaxis We will keep her on a diuretic for now and get blood work today.  Plan to bring her back in about 10  days.  The patient is in agreement with the above plan. The patient left the office in stable condition.  The patient will follow up in 10  Patient Instructions  Medication Instructions:  Your physician has recommended you make the following change in your medication:  INCREASE: Labetalol 200mg  every 8 hours.  *If you need a refill on your cardiac medications before your next appointment, please call your pharmacy*  Please send me a Mychart on Tuesday with your blood pressure readings. If your BP is greater that 140/90 on the weekend its okay for you to take Nifedipine 60mg  in the AM and 30mg  in the PM   Lab Work: Your physician recommends that you have the following labs drawn today BMET and Magnesium.  If you have labs (blood work) drawn today and your tests are completely normal, you will receive your results only by: MyChart Message (if you have MyChart) OR A paper copy in the mail If you have any lab test that is abnormal or we need to change your treatment, we will call you to review the results.   Testing/Procedures: NONE   Follow-Up: At [facility], you and your health needs are our priority.  As part of our continuing mission to provide you with exceptional heart care, we have created designated Provider Care  Teams.  These Care Teams include your primary Cardiologist (physician) and Advanced Practice Providers (APPs -  Physician Assistants and Nurse Practitioners) who all work together to provide you with the care you need, when you need it.  We recommend signing up for the patient portal called "MyChart".  Sign up information is provided on this After Visit Summary.  MyChart is used to connect with patients for Virtual Visits (Telemedicine).  Patients are able to view lab/test results, encounter notes, upcoming appointments, etc.  Non-urgent messages can be sent to your provider as well.   To learn more about what you can do with MyChart, go to [email or web address].     Your next appointment:   09/19/2022   The format for your next appointment:   In Person  Provider:   Thomasene RippleKardie Slayden Mennenga, DO     Dispo:  Return in about 2 weeks (around 09/19/2022).   Medication Adjustments/Labs and Tests Ordered: Current medicines are reviewed at length with the patient today.  Concerns regarding medicines are outlined above.  Tests Ordered: Orders Placed This Encounter  Procedures   Basic Metabolic Panel (BMET)   Magnesium   Medication Changes: Meds ordered this encounter  Medications   labetalol (NORMODYNE) 200 MG tablet    Sig: Take 1 tablet (200 mg total) by mouth every 8 (eight) hours.    Dispense:  90 tablet    Refill:  2

## 2022-09-05 NOTE — Patient Instructions (Addendum)
Medication Instructions:  Your physician has recommended you make the following change in your medication:  INCREASE: Labetalol 200mg  every 8 hours.  *If you need a refill on your cardiac medications before your next appointment, please call your pharmacy*  Please send me a Mychart on Tuesday with your blood pressure readings. If your BP is greater that 140/90 on the weekend its okay for you to take Nifedipine 60mg  in the AM and 30mg  in the PM   Lab Work: Your physician recommends that you have the following labs drawn today BMET and Magnesium.  If you have labs (blood work) drawn today and your tests are completely normal, you will receive your results only by: MyChart Message (if you have MyChart) OR A paper copy in the mail If you have any lab test that is abnormal or we need to change your treatment, we will call you to review the results.   Testing/Procedures: NONE   Follow-Up: At [facility], you and your health needs are our priority.  As part of our continuing mission to provide you with exceptional heart care, we have created designated Provider Care Teams.  These Care Teams include your primary Cardiologist (physician) and Advanced Practice Providers (APPs -  Physician Assistants and Nurse Practitioners) who all work together to provide you with the care you need, when you need it.  We recommend signing up for the patient portal called "MyChart".  Sign up information is provided on this After Visit Summary.  MyChart is used to connect with patients for Virtual Visits (Telemedicine).  Patients are able to view lab/test results, encounter notes, upcoming appointments, etc.  Non-urgent messages can be sent to your provider as well.   To learn more about what you can do with MyChart, go to .    Your next appointment:   09/19/2022   The format for your next appointment:   In Person  Provider:   [facility], DO

## 2022-09-06 LAB — MAGNESIUM: Magnesium: 1.7 mg/dL (ref 1.6–2.3)

## 2022-09-06 LAB — BASIC METABOLIC PANEL
BUN/Creatinine Ratio: 12 (ref 9–23)
BUN: 7 mg/dL (ref 6–20)
CO2: 17 mmol/L — ABNORMAL LOW (ref 20–29)
Calcium: 8.7 mg/dL (ref 8.7–10.2)
Chloride: 107 mmol/L — ABNORMAL HIGH (ref 96–106)
Creatinine, Ser: 0.59 mg/dL (ref 0.57–1.00)
Glucose: 64 mg/dL — ABNORMAL LOW (ref 70–99)
Potassium: 4.3 mmol/L (ref 3.5–5.2)
Sodium: 139 mmol/L (ref 134–144)
eGFR: 120 mL/min/{1.73_m2} (ref >59)

## 2022-09-07 ENCOUNTER — Ambulatory Visit: Payer: BC Managed Care – PPO | Attending: Maternal & Fetal Medicine

## 2022-09-07 ENCOUNTER — Ambulatory Visit: Payer: BC Managed Care – PPO | Admitting: *Deleted

## 2022-09-07 VITALS — BP 122/79 | HR 96

## 2022-09-07 DIAGNOSIS — O09523 Supervision of elderly multigravida, third trimester: Secondary | ICD-10-CM | POA: Diagnosis not present

## 2022-09-07 DIAGNOSIS — O09529 Supervision of elderly multigravida, unspecified trimester: Secondary | ICD-10-CM | POA: Diagnosis not present

## 2022-09-07 DIAGNOSIS — O99820 Streptococcus B carrier state complicating pregnancy: Secondary | ICD-10-CM | POA: Diagnosis not present

## 2022-09-07 DIAGNOSIS — O99843 Bariatric surgery status complicating pregnancy, third trimester: Secondary | ICD-10-CM | POA: Diagnosis not present

## 2022-09-07 DIAGNOSIS — R638 Other symptoms and signs concerning food and fluid intake: Secondary | ICD-10-CM | POA: Diagnosis not present

## 2022-09-07 DIAGNOSIS — O09293 Supervision of pregnancy with other poor reproductive or obstetric history, third trimester: Secondary | ICD-10-CM

## 2022-09-07 DIAGNOSIS — Z3A36 36 weeks gestation of pregnancy: Secondary | ICD-10-CM

## 2022-09-07 DIAGNOSIS — Z9884 Bariatric surgery status: Secondary | ICD-10-CM

## 2022-09-07 DIAGNOSIS — E669 Obesity, unspecified: Secondary | ICD-10-CM

## 2022-09-07 DIAGNOSIS — O99213 Obesity complicating pregnancy, third trimester: Secondary | ICD-10-CM | POA: Diagnosis not present

## 2022-09-07 DIAGNOSIS — O10913 Unspecified pre-existing hypertension complicating pregnancy, third trimester: Secondary | ICD-10-CM | POA: Diagnosis not present

## 2022-09-07 DIAGNOSIS — O10013 Pre-existing essential hypertension complicating pregnancy, third trimester: Secondary | ICD-10-CM | POA: Diagnosis not present

## 2022-09-07 DIAGNOSIS — O99013 Anemia complicating pregnancy, third trimester: Secondary | ICD-10-CM | POA: Diagnosis not present

## 2022-09-07 DIAGNOSIS — Z362 Encounter for other antenatal screening follow-up: Secondary | ICD-10-CM

## 2022-09-11 ENCOUNTER — Other Ambulatory Visit (INDEPENDENT_AMBULATORY_CARE_PROVIDER_SITE_OTHER): Payer: Self-pay | Admitting: Family Medicine

## 2022-09-11 DIAGNOSIS — E559 Vitamin D deficiency, unspecified: Secondary | ICD-10-CM

## 2022-09-12 ENCOUNTER — Encounter: Payer: Self-pay | Admitting: Cardiology

## 2022-09-12 DIAGNOSIS — Z79899 Other long term (current) drug therapy: Secondary | ICD-10-CM

## 2022-09-13 ENCOUNTER — Ambulatory Visit: Payer: BC Managed Care – PPO | Admitting: *Deleted

## 2022-09-13 ENCOUNTER — Encounter: Payer: Self-pay | Admitting: Cardiology

## 2022-09-13 ENCOUNTER — Other Ambulatory Visit (INDEPENDENT_AMBULATORY_CARE_PROVIDER_SITE_OTHER): Payer: Self-pay | Admitting: Family Medicine

## 2022-09-13 ENCOUNTER — Ambulatory Visit (HOSPITAL_BASED_OUTPATIENT_CLINIC_OR_DEPARTMENT_OTHER): Payer: BC Managed Care – PPO

## 2022-09-13 ENCOUNTER — Other Ambulatory Visit: Payer: Self-pay | Admitting: Cardiology

## 2022-09-13 VITALS — BP 137/83 | HR 82

## 2022-09-13 DIAGNOSIS — O133 Gestational [pregnancy-induced] hypertension without significant proteinuria, third trimester: Secondary | ICD-10-CM | POA: Diagnosis not present

## 2022-09-13 DIAGNOSIS — O9902 Anemia complicating childbirth: Secondary | ICD-10-CM | POA: Diagnosis not present

## 2022-09-13 DIAGNOSIS — E538 Deficiency of other specified B group vitamins: Secondary | ICD-10-CM

## 2022-09-13 DIAGNOSIS — O99213 Obesity complicating pregnancy, third trimester: Secondary | ICD-10-CM | POA: Diagnosis not present

## 2022-09-13 DIAGNOSIS — O09529 Supervision of elderly multigravida, unspecified trimester: Secondary | ICD-10-CM | POA: Diagnosis not present

## 2022-09-13 DIAGNOSIS — O09523 Supervision of elderly multigravida, third trimester: Secondary | ICD-10-CM | POA: Insufficient documentation

## 2022-09-13 DIAGNOSIS — O99820 Streptococcus B carrier state complicating pregnancy: Secondary | ICD-10-CM | POA: Diagnosis not present

## 2022-09-13 DIAGNOSIS — Z3A37 37 weeks gestation of pregnancy: Secondary | ICD-10-CM | POA: Insufficient documentation

## 2022-09-13 DIAGNOSIS — O09293 Supervision of pregnancy with other poor reproductive or obstetric history, third trimester: Secondary | ICD-10-CM | POA: Insufficient documentation

## 2022-09-13 DIAGNOSIS — Z3A38 38 weeks gestation of pregnancy: Secondary | ICD-10-CM | POA: Diagnosis not present

## 2022-09-13 DIAGNOSIS — E669 Obesity, unspecified: Secondary | ICD-10-CM

## 2022-09-13 DIAGNOSIS — O99214 Obesity complicating childbirth: Secondary | ICD-10-CM | POA: Diagnosis not present

## 2022-09-13 DIAGNOSIS — Z87891 Personal history of nicotine dependence: Secondary | ICD-10-CM | POA: Diagnosis not present

## 2022-09-13 DIAGNOSIS — Z9884 Bariatric surgery status: Secondary | ICD-10-CM | POA: Insufficient documentation

## 2022-09-13 DIAGNOSIS — O26893 Other specified pregnancy related conditions, third trimester: Secondary | ICD-10-CM | POA: Insufficient documentation

## 2022-09-13 DIAGNOSIS — D509 Iron deficiency anemia, unspecified: Secondary | ICD-10-CM | POA: Diagnosis not present

## 2022-09-13 DIAGNOSIS — O10913 Unspecified pre-existing hypertension complicating pregnancy, third trimester: Secondary | ICD-10-CM | POA: Insufficient documentation

## 2022-09-13 DIAGNOSIS — R638 Other symptoms and signs concerning food and fluid intake: Secondary | ICD-10-CM | POA: Insufficient documentation

## 2022-09-13 DIAGNOSIS — O99844 Bariatric surgery status complicating childbirth: Secondary | ICD-10-CM | POA: Diagnosis not present

## 2022-09-13 DIAGNOSIS — O99824 Streptococcus B carrier state complicating childbirth: Secondary | ICD-10-CM | POA: Diagnosis not present

## 2022-09-13 DIAGNOSIS — O99843 Bariatric surgery status complicating pregnancy, third trimester: Secondary | ICD-10-CM

## 2022-09-13 DIAGNOSIS — O164 Unspecified maternal hypertension, complicating childbirth: Secondary | ICD-10-CM | POA: Diagnosis not present

## 2022-09-13 DIAGNOSIS — O10013 Pre-existing essential hypertension complicating pregnancy, third trimester: Secondary | ICD-10-CM

## 2022-09-13 DIAGNOSIS — O99013 Anemia complicating pregnancy, third trimester: Secondary | ICD-10-CM | POA: Diagnosis not present

## 2022-09-13 DIAGNOSIS — O1002 Pre-existing essential hypertension complicating childbirth: Secondary | ICD-10-CM | POA: Diagnosis not present

## 2022-09-13 DIAGNOSIS — D649 Anemia, unspecified: Secondary | ICD-10-CM | POA: Diagnosis not present

## 2022-09-13 DIAGNOSIS — Z8249 Family history of ischemic heart disease and other diseases of the circulatory system: Secondary | ICD-10-CM | POA: Diagnosis not present

## 2022-09-13 LAB — OB RESULTS CONSOLE ABO/RH: RH Type: POSITIVE

## 2022-09-13 LAB — OB RESULTS CONSOLE ANTIBODY SCREEN: Antibody Screen: NEGATIVE

## 2022-09-14 ENCOUNTER — Encounter (HOSPITAL_COMMUNITY): Payer: Self-pay | Admitting: *Deleted

## 2022-09-14 ENCOUNTER — Other Ambulatory Visit (HOSPITAL_COMMUNITY): Payer: BC Managed Care – PPO

## 2022-09-14 ENCOUNTER — Telehealth (HOSPITAL_COMMUNITY): Payer: Self-pay | Admitting: *Deleted

## 2022-09-14 LAB — COMPREHENSIVE METABOLIC PANEL
ALT: 11 IU/L (ref 0–32)
AST: 16 IU/L (ref 0–40)
Albumin/Globulin Ratio: 1.1 — ABNORMAL LOW (ref 1.2–2.2)
Albumin: 3.1 g/dL — ABNORMAL LOW (ref 3.9–4.9)
Alkaline Phosphatase: 113 IU/L (ref 44–121)
BUN/Creatinine Ratio: 12 (ref 9–23)
BUN: 7 mg/dL (ref 6–20)
Bilirubin Total: 0.3 mg/dL (ref 0.0–1.2)
CO2: 19 mmol/L — ABNORMAL LOW (ref 20–29)
Calcium: 8.8 mg/dL (ref 8.7–10.2)
Chloride: 107 mmol/L — ABNORMAL HIGH (ref 96–106)
Creatinine, Ser: 0.59 mg/dL (ref 0.57–1.00)
Globulin, Total: 2.8 g/dL (ref 1.5–4.5)
Glucose: 101 mg/dL — ABNORMAL HIGH (ref 70–99)
Potassium: 4.1 mmol/L (ref 3.5–5.2)
Sodium: 136 mmol/L (ref 134–144)
Total Protein: 5.9 g/dL — ABNORMAL LOW (ref 6.0–8.5)
eGFR: 120 mL/min/{1.73_m2} (ref >59)

## 2022-09-14 LAB — LAB REPORT - SCANNED: Creatinine, POC: 67.6 mg/dL

## 2022-09-14 LAB — CBC
Hematocrit: 32 % — ABNORMAL LOW (ref 34.0–46.6)
Hemoglobin: 9.8 g/dL — ABNORMAL LOW (ref 11.1–15.9)
MCH: 26.3 pg — ABNORMAL LOW (ref 26.6–33.0)
MCHC: 30.6 g/dL — ABNORMAL LOW (ref 31.5–35.7)
MCV: 86 fL (ref 79–97)
Platelets: 281 10*3/uL (ref 150–450)
RBC: 3.72 x10E6/uL — ABNORMAL LOW (ref 3.77–5.28)
RDW: 21.8 % — ABNORMAL HIGH (ref 11.7–15.4)
WBC: 7.8 10*3/uL (ref 3.4–10.8)

## 2022-09-14 NOTE — Telephone Encounter (Signed)
Preadmission screen  

## 2022-09-16 ENCOUNTER — Inpatient Hospital Stay (HOSPITAL_COMMUNITY)
Admission: RE | Admit: 2022-09-16 | Discharge: 2022-09-16 | Disposition: A | Discharge status: Home / Self Care | Payer: BC Managed Care – PPO | Source: Ambulatory Visit | Attending: Obstetrics and Gynecology | Admitting: Obstetrics and Gynecology

## 2022-09-16 ENCOUNTER — Inpatient Hospital Stay (HOSPITAL_COMMUNITY)
Admission: AD | Admit: 2022-09-16 | Discharge: 2022-09-19 | DRG: 807 | Disposition: A | Discharge status: Home / Self Care | Payer: BC Managed Care – PPO | Attending: Obstetrics and Gynecology | Admitting: Obstetrics and Gynecology

## 2022-09-16 ENCOUNTER — Encounter (HOSPITAL_COMMUNITY): Payer: Self-pay | Admitting: Obstetrics and Gynecology

## 2022-09-16 ENCOUNTER — Other Ambulatory Visit: Payer: Self-pay | Admitting: Obstetrics and Gynecology

## 2022-09-16 ENCOUNTER — Encounter (HOSPITAL_COMMUNITY): Payer: Self-pay

## 2022-09-16 DIAGNOSIS — D509 Iron deficiency anemia, unspecified: Secondary | ICD-10-CM | POA: Diagnosis present

## 2022-09-16 DIAGNOSIS — O10013 Pre-existing essential hypertension complicating pregnancy, third trimester: Secondary | ICD-10-CM | POA: Diagnosis not present

## 2022-09-16 DIAGNOSIS — D649 Anemia, unspecified: Secondary | ICD-10-CM | POA: Diagnosis not present

## 2022-09-16 DIAGNOSIS — O164 Unspecified maternal hypertension, complicating childbirth: Secondary | ICD-10-CM | POA: Diagnosis not present

## 2022-09-16 DIAGNOSIS — O1002 Pre-existing essential hypertension complicating childbirth: Principal | ICD-10-CM | POA: Diagnosis present

## 2022-09-16 DIAGNOSIS — Z87891 Personal history of nicotine dependence: Secondary | ICD-10-CM | POA: Diagnosis not present

## 2022-09-16 DIAGNOSIS — Z8249 Family history of ischemic heart disease and other diseases of the circulatory system: Secondary | ICD-10-CM | POA: Diagnosis not present

## 2022-09-16 DIAGNOSIS — O99824 Streptococcus B carrier state complicating childbirth: Secondary | ICD-10-CM | POA: Diagnosis present

## 2022-09-16 DIAGNOSIS — O99844 Bariatric surgery status complicating childbirth: Secondary | ICD-10-CM | POA: Diagnosis present

## 2022-09-16 DIAGNOSIS — O9902 Anemia complicating childbirth: Secondary | ICD-10-CM | POA: Diagnosis present

## 2022-09-16 DIAGNOSIS — O99820 Streptococcus B carrier state complicating pregnancy: Secondary | ICD-10-CM | POA: Diagnosis not present

## 2022-09-16 DIAGNOSIS — O99214 Obesity complicating childbirth: Secondary | ICD-10-CM | POA: Diagnosis present

## 2022-09-16 DIAGNOSIS — Z3A38 38 weeks gestation of pregnancy: Secondary | ICD-10-CM | POA: Diagnosis not present

## 2022-09-16 LAB — COMPREHENSIVE METABOLIC PANEL
ALT: 19 U/L (ref 0–44)
AST: 23 U/L (ref 15–41)
Albumin: 2.9 g/dL — ABNORMAL LOW (ref 3.5–5.0)
Alkaline Phosphatase: 108 U/L (ref 38–126)
Anion gap: 7 (ref 5–15)
BUN: 6 mg/dL (ref 6–20)
CO2: 20 mmol/L — ABNORMAL LOW (ref 22–32)
Calcium: 8.7 mg/dL — ABNORMAL LOW (ref 8.9–10.3)
Chloride: 109 mmol/L (ref 98–111)
Creatinine, Ser: 0.55 mg/dL (ref 0.44–1.00)
GFR, Estimated: >60 mL/min (ref >60)
Glucose, Bld: 75 mg/dL (ref 70–99)
Potassium: 4.2 mmol/L (ref 3.5–5.1)
Sodium: 136 mmol/L (ref 135–145)
Total Bilirubin: 0.2 mg/dL — ABNORMAL LOW (ref 0.3–1.2)
Total Protein: 6.5 g/dL (ref 6.5–8.1)

## 2022-09-16 LAB — CBC
HCT: 32.7 % — ABNORMAL LOW (ref 36.0–46.0)
Hemoglobin: 10.2 g/dL — ABNORMAL LOW (ref 12.0–15.0)
MCH: 26.7 pg (ref 26.0–34.0)
MCHC: 31.2 g/dL (ref 30.0–36.0)
MCV: 85.6 fL (ref 80.0–100.0)
Platelets: 285 10*3/uL (ref 150–400)
RBC: 3.82 MIL/uL — ABNORMAL LOW (ref 3.87–5.11)
RDW: 24 % — ABNORMAL HIGH (ref 11.5–15.5)
WBC: 7.8 10*3/uL (ref 4.0–10.5)
nRBC: 0 % (ref 0.0–0.2)

## 2022-09-16 LAB — PROTEIN / CREATININE RATIO, URINE
Creatinine, Urine: 55 mg/dL
Total Protein, Urine: <6 mg/dL

## 2022-09-16 LAB — TYPE AND SCREEN
ABO/RH(D): O POS
Antibody Screen: NEGATIVE

## 2022-09-16 LAB — RPR: RPR Ser Ql: NONREACTIVE

## 2022-09-16 MED ORDER — MISOPROSTOL 25 MCG QUARTER TABLET
25.0000 ug | ORAL_TABLET | Freq: Once | ORAL | Status: AC
  Administered 2022-09-16: 25 ug via VAGINAL
  Filled 2022-09-16: qty 1

## 2022-09-16 MED ORDER — PHENYLEPHRINE 80 MCG/ML (10ML) SYRINGE FOR IV PUSH (FOR BLOOD PRESSURE SUPPORT): 80.0000 ug | PREFILLED_SYRINGE | INTRAVENOUS | Status: DC | PRN

## 2022-09-16 MED ORDER — LACTATED RINGERS IV SOLN: INTRAVENOUS | Status: DC

## 2022-09-16 MED ORDER — OXYTOCIN-SODIUM CHLORIDE 30-0.9 UT/500ML-% IV SOLN
2.5000 [IU]/h | INTRAVENOUS | Status: DC
  Administered 2022-09-18 (×2): 2.5 [IU]/h via INTRAVENOUS
  Filled 2022-09-16 (×2): qty 500

## 2022-09-16 MED ORDER — OXYTOCIN 10 UNIT/ML IJ SOLN: 10.0000 [IU] | Freq: Once | INTRAMUSCULAR | Status: DC

## 2022-09-16 MED ORDER — SODIUM CHLORIDE 0.9 % IV SOLN
5.0000 10*6.[IU] | Freq: Once | INTRAVENOUS | Status: AC
  Administered 2022-09-16: 5 10*6.[IU] via INTRAVENOUS
  Filled 2022-09-16: qty 5

## 2022-09-16 MED ORDER — LABETALOL HCL 200 MG PO TABS
200.0000 mg | ORAL_TABLET | Freq: Three times a day (TID) | ORAL | Status: DC
  Administered 2022-09-16 – 2022-09-19 (×9): 200 mg via ORAL
  Filled 2022-09-16 (×9): qty 1

## 2022-09-16 MED ORDER — FENTANYL CITRATE (PF) 100 MCG/2ML IJ SOLN
100.0000 ug | INTRAMUSCULAR | Status: DC | PRN
  Administered 2022-09-16 – 2022-09-17 (×4): 100 ug via INTRAVENOUS
  Filled 2022-09-16 (×4): qty 2

## 2022-09-16 MED ORDER — LACTATED RINGERS IV SOLN: 500.0000 mL | INTRAVENOUS | Status: DC | PRN

## 2022-09-16 MED ORDER — OXYCODONE-ACETAMINOPHEN 5-325 MG PO TABS: 2.0000 | ORAL_TABLET | ORAL | Status: DC | PRN

## 2022-09-16 MED ORDER — EPHEDRINE 5 MG/ML INJ: 10.0000 mg | INTRAVENOUS | Status: DC | PRN

## 2022-09-16 MED ORDER — TERBUTALINE SULFATE 1 MG/ML IJ SOLN: 0.2500 mg | Freq: Once | INTRAMUSCULAR | Status: DC | PRN

## 2022-09-16 MED ORDER — OXYCODONE-ACETAMINOPHEN 5-325 MG PO TABS: 1.0000 | ORAL_TABLET | ORAL | Status: DC | PRN

## 2022-09-16 MED ORDER — MISOPROSTOL 50MCG HALF TABLET
50.0000 ug | ORAL_TABLET | Freq: Once | ORAL | Status: AC
  Administered 2022-09-16: 50 ug via ORAL
  Filled 2022-09-16: qty 1

## 2022-09-16 MED ORDER — OXYTOCIN BOLUS FROM INFUSION
333.0000 mL | Freq: Once | INTRAVENOUS | Status: AC
  Administered 2022-09-18: 333 mL via INTRAVENOUS

## 2022-09-16 MED ORDER — MISOPROSTOL 50MCG HALF TABLET
50.0000 ug | ORAL_TABLET | ORAL | Status: DC
  Administered 2022-09-16 – 2022-09-17 (×3): 50 ug via ORAL
  Filled 2022-09-16 (×3): qty 1

## 2022-09-16 MED ORDER — DIPHENHYDRAMINE HCL 50 MG/ML IJ SOLN
12.5000 mg | INTRAMUSCULAR | Status: DC | PRN
  Administered 2022-09-17 (×2): 12.5 mg via INTRAVENOUS
  Filled 2022-09-16: qty 1

## 2022-09-16 MED ORDER — LACTATED RINGERS IV SOLN
500.0000 mL | Freq: Once | INTRAVENOUS | Status: AC
  Administered 2022-09-17: 500 mL via INTRAVENOUS

## 2022-09-16 MED ORDER — ACETAMINOPHEN 325 MG PO TABS: 650.0000 mg | ORAL_TABLET | ORAL | Status: DC | PRN

## 2022-09-16 MED ORDER — LIDOCAINE HCL (PF) 1 % IJ SOLN: 30.0000 mL | INTRAMUSCULAR | Status: DC | PRN

## 2022-09-16 MED ORDER — FENTANYL-BUPIVACAINE-NACL 0.5-0.125-0.9 MG/250ML-% EP SOLN
12.0000 mL/h | EPIDURAL | Status: DC | PRN
  Administered 2022-09-17: 13.5 mL/h via EPIDURAL
  Administered 2022-09-17: 12 mL/h via EPIDURAL
  Filled 2022-09-16 (×2): qty 250

## 2022-09-16 MED ORDER — PENICILLIN G POT IN DEXTROSE 60000 UNIT/ML IV SOLN
3.0000 10*6.[IU] | INTRAVENOUS | Status: DC
  Administered 2022-09-16 – 2022-09-17 (×6): 3 10*6.[IU] via INTRAVENOUS
  Filled 2022-09-16 (×7): qty 50

## 2022-09-16 MED ORDER — SOD CITRATE-CITRIC ACID 500-334 MG/5ML PO SOLN: 30.0000 mL | ORAL | Status: DC | PRN

## 2022-09-16 MED ORDER — ONDANSETRON HCL 4 MG/2ML IJ SOLN: 4.0000 mg | Freq: Four times a day (QID) | INTRAMUSCULAR | Status: DC | PRN

## 2022-09-16 MED ORDER — NIFEDIPINE ER OSMOTIC RELEASE 30 MG PO TB24
60.0000 mg | ORAL_TABLET | Freq: Every day | ORAL | Status: DC
  Administered 2022-09-17 – 2022-09-19 (×3): 60 mg via ORAL
  Filled 2022-09-16 (×3): qty 2

## 2022-09-16 NOTE — Progress Notes (Signed)
Patient ID: Carrie Shaw, female   DOB: [birth year], 36 y.o.   MRN: [id number] Ms. Carrie Shaw is a 36 y.o. [phone] female at [redacted]w[redacted]d weeks gestation on L&D for IOL. CNM was requested by RN to verify presentation prior to next Cytotec dose.  NST - FHR: 145 bpm / moderate variability / accels present / decels absent / TOCO: regular every 2-4 mins   Patient informed that the ultrasound is considered a limited OB ultrasound and is not intended to be a complete ultrasound exam.  Patient also informed that the ultrasound is not being completed with the intent of assessing for fetal or placental anomalies or any pelvic abnormalities.  Explained that the purpose of today's ultrasound is to assess for presentation.  Baby was found to be in a cephalic -- direct OP presentation. Patient acknowledges the purpose of the exam and the limitations of the study.  Raelyn Mora, CNM  09/16/2022 9:37 PM

## 2022-09-16 NOTE — H&P (Signed)
Carrie Shaw is a 36 y.o. female presenting for IOL @ 38 wk due to HTN. OB History     Gravida  5   Para  2   Term  2   Preterm      AB  2   Living  2      SAB  0   IAB  2   Ectopic      Multiple      Live Births  2          Past Medical History:  Diagnosis Date   ADD (attention deficit disorder)    Anemia    Anxiety    B12 deficiency    Back pain    Bilateral swelling of feet    Depression    Gallbladder problem    HSV infection    Hypertension    Joint pain    Lower extremity edema    Obesity    Pregnancy induced hypertension    Sleep apnea    mild no mask   SOB (shortness of breath)    SVD (spontaneous vaginal delivery)    x 2   Termination of pregnancy (fetus)    x 2   UTI (urinary tract infection)    Vitamin B12 deficiency    Vitamin D deficiency    Past Surgical History:  Procedure Laterality Date   APPENDECTOMY     CYSTOSCOPY N/A 08/16/2017   Procedure: CYSTOSCOPY;  Surgeon: Conan Bowens, MD;  Location: [facility];  Service: Gynecology;  Laterality: N/A;   GASTRIC ROUX-EN-Y N/A 01/01/2018   Procedure: LAPAROSCOPIC ROUX-EN-Y GASTRIC BYPASS WITH UPPER ENDOSCOPY;  Surgeon: Gaynelle Adu, MD;  Location: [facility];  Service: General;  Laterality: N/A;   LAPAROSCOPY N/A 08/16/2017   Procedure: LAPAROSCOPY DIAGNOSTIC;  Surgeon: Conan Bowens, MD;  Location: [facility];  Service: Gynecology;  Laterality: N/A;   LAPAROTOMY N/A 08/16/2017   Procedure: EXPLORATORY LAPAROTOMY WITH REMOVAL OF LEFT ECTOPIC PREGNANCY, LEFT SALPINGECTOMY AND CYSTOTOMY OF BLADDER;  Surgeon: Conan Bowens, MD;  Location: [facility];  Service: Gynecology;  Laterality: N/A;   LYSIS OF ADHESION N/A 08/16/2017   Procedure: LYSIS OF ADHESION;  Surgeon: Conan Bowens, MD;  Location: [facility];  Service: Gynecology;  Laterality: N/A;   Tummy tuck     10/2019   UNILATERAL SALPINGECTOMY Left 08/16/2017   Procedure: UNILATERAL SALPINGECTOMY;  Surgeon: Conan Bowens, MD;  Location: [facility];  Service: Gynecology;  Laterality: Left;   Family History: family history includes Alcoholism in her father; Anxiety disorder in her father and mother; Bipolar disorder in her sister; Depression in her father and mother; Drug abuse in her father; Eating disorder in her mother; Hypertension in her father and mother; Obesity in her father and mother; Schizophrenia in her sister; Sleep apnea in her father and mother. Social History:  reports that she quit smoking about 9 months ago. Her smoking use included cigarettes. She has a 2.50 pack-year smoking history. She has never used smokeless tobacco. She reports that she does not currently use alcohol. She reports that she does not use drugs.     Maternal Diabetes: No Genetic Screening: Normal Maternal Ultrasounds/Referrals: Normal Fetal Ultrasounds or other Referrals:  None Maternal Substance Abuse:  No Significant Maternal Medications:  Meds include: Other: labetalol and procardia Significant Maternal Lab Results:  Group B Strep positive Number of Prenatal Visits:greater than 3 verified prenatal visits Other Comments:   hx gastric sleeve. IDA, chronic HTN, obesity  Review of Systems  All other systems reviewed and are negative.  History Dilation: Closed Effacement (%): 60 Station: -3 Exam by:: M Aube RN Blood pressure (!) 120/93, pulse 87, temperature 98 F (36.7 C), temperature source Oral, resp. rate 16, height 5\' 11"  (1.803 m), weight (!) 152.5 kg, last menstrual period 12/24/2021, SpO2 99 %. Exam Physical Exam Constitutional:      Appearance: Normal appearance.  HENT:     Left Ear: Tympanic membrane normal.  Eyes:     Extraocular Movements: Extraocular movements intact.  Cardiovascular:     Rate and Rhythm: Regular rhythm.     Heart sounds: Normal heart sounds.  Pulmonary:     Breath sounds: Normal breath sounds.  Abdominal:     Comments: Tummy tuck  Musculoskeletal:        General: Swelling present.     Cervical  back: Neck supple.  Skin:    General: Skin is warm.  Neurological:     General: No focal deficit present.     Mental Status: She is alert and oriented to person, place, and time.  Psychiatric:        Mood and Affect: Mood normal.        Behavior: Behavior normal.     Prenatal labs: ABO, Rh: --/--/O POS (12/16 1201) Antibody: NEG (12/16 1201) Rubella: Immune (06/07 0000) RPR: NON REACTIVE (12/16 1147)  HBsAg: Negative (06/07 0000)  HIV: Non-reactive (06/07 0000)  GBS: Positive/-- (11/29 0000)   Assessment/Plan: Chronic HTN affecting pregnancy IUP @ 38 wk GBS cx positive Obesity affecting pregnancy P) admit PIH labs. Cervical ripening with cytotec. Continue labetalol and procardia. IV PCN. Analgesic prn   Carrie Schoonmaker A Edwar Coe 09/16/2022, 3:35 PM

## 2022-09-16 NOTE — Progress Notes (Signed)
Noya Andora Krull is a 36 y.o. [id number] at [redacted]w[redacted]d by {CHL MAU METHOD OF PREG DETERMINATION:21616} admitted for {Reason for Admission:21615}  Subjective: No chief complaint on file.   Objective: BP (!) 120/93   Pulse 87   Temp 98 F (36.7 C) (Oral)   Resp 16   Ht 5\' 11"  (1.803 m)   Wt (!) 152.5 kg   LMP 12/24/2021   SpO2 99%   BMI 46.88 kg/m  No intake/output data recorded. No intake/output data recorded.  FHT:  {findings; monitor fetal heart monitor:21620} UC:   {obgyn contractions reg/irreg:312982} SVE:   {pe cervical exam intrapartum:314522} Tracing: ***  Labs: Lab Results  Component Value Date   WBC 7.8 09/16/2022   HGB 10.2 (L) 09/16/2022   HCT 32.7 (L) 09/16/2022   MCV 85.6 09/16/2022   PLT 285 09/16/2022    Assessment / Plan: {CHL LABOR ASSESSMENT:21627}   Anticipated MOD:  09/18/2022  Bond Grieshop A Meliana Canner 09/16/2022, 4:39 PM

## 2022-09-17 ENCOUNTER — Inpatient Hospital Stay (HOSPITAL_COMMUNITY): Payer: BC Managed Care – PPO | Admitting: Anesthesiology

## 2022-09-17 ENCOUNTER — Encounter (HOSPITAL_COMMUNITY): Payer: Self-pay | Admitting: Obstetrics and Gynecology

## 2022-09-17 LAB — CBC WITH DIFFERENTIAL/PLATELET
Abs Immature Granulocytes: 0.05 10*3/uL (ref 0.00–0.07)
Basophils Absolute: 0 10*3/uL (ref 0.0–0.1)
Basophils Relative: 0 %
Eosinophils Absolute: 0.2 10*3/uL (ref 0.0–0.5)
Eosinophils Relative: 1 %
HCT: 34.1 % — ABNORMAL LOW (ref 36.0–46.0)
Hemoglobin: 10.7 g/dL — ABNORMAL LOW (ref 12.0–15.0)
Immature Granulocytes: 0 %
Lymphocytes Relative: 17 %
Lymphs Abs: 2 10*3/uL (ref 0.7–4.0)
MCH: 26.9 pg (ref 26.0–34.0)
MCHC: 31.4 g/dL (ref 30.0–36.0)
MCV: 85.7 fL (ref 80.0–100.0)
Monocytes Absolute: 1.1 10*3/uL — ABNORMAL HIGH (ref 0.1–1.0)
Monocytes Relative: 9 %
Neutro Abs: 8.8 10*3/uL — ABNORMAL HIGH (ref 1.7–7.7)
Neutrophils Relative %: 73 %
Platelets: 298 10*3/uL (ref 150–400)
RBC: 3.98 MIL/uL (ref 3.87–5.11)
RDW: 24 % — ABNORMAL HIGH (ref 11.5–15.5)
WBC: 12.2 10*3/uL — ABNORMAL HIGH (ref 4.0–10.5)
nRBC: 0 % (ref 0.0–0.2)

## 2022-09-17 MED ORDER — TERBUTALINE SULFATE 1 MG/ML IJ SOLN: 0.2500 mg | Freq: Once | INTRAMUSCULAR | Status: DC | PRN

## 2022-09-17 MED ORDER — OXYTOCIN-SODIUM CHLORIDE 30-0.9 UT/500ML-% IV SOLN
1.0000 m[IU]/min | INTRAVENOUS | Status: DC
  Administered 2022-09-17: 2 m[IU]/min via INTRAVENOUS
  Administered 2022-09-17: 4 m[IU]/min via INTRAVENOUS
  Administered 2022-09-17: 16 m[IU]/min via INTRAVENOUS

## 2022-09-17 MED ORDER — LIDOCAINE HCL (PF) 1 % IJ SOLN
INTRAMUSCULAR | Status: DC | PRN
  Administered 2022-09-17: 3 mL via EPIDURAL
  Administered 2022-09-17 (×2): 5 mL via EPIDURAL

## 2022-09-17 MED ORDER — CALCIUM CARBONATE ANTACID 500 MG PO CHEW
400.0000 mg | CHEWABLE_TABLET | Freq: Every day | ORAL | Status: DC
  Administered 2022-09-17: 400 mg via ORAL
  Filled 2022-09-17: qty 2

## 2022-09-17 MED ORDER — DIPHENHYDRAMINE-ZINC ACETATE 2-0.1 % EX CREA: TOPICAL_CREAM | Freq: Every day | CUTANEOUS | Status: DC | PRN

## 2022-09-17 MED ORDER — LACTATED RINGERS AMNIOINFUSION: INTRAVENOUS | Status: DC

## 2022-09-17 NOTE — Progress Notes (Signed)
Carrie Shaw is a 36 y.o. [id number] at [redacted]w[redacted]d by LMP admitted for induction of labor due to Hypertension.  Subjective: S: no complaint Epidural Pitocin 4 miu  Objective: BP 124/68   Pulse 64   Temp 98.1 F (36.7 C) (Axillary)   Resp 18   Ht 5\' 11"  (1.803 m)   Wt (!) 152.5 kg   LMP 12/24/2021   SpO2 99%   BMI 46.88 kg/m  No intake/output data recorded. No intake/output data recorded.  FHT:  FHR: 140 bpm, variability: moderate,  accelerations:  Present,  decelerations:  Absent UC:   irregular, every 2-4 minutes SVE:   4 cm dilated, 70% effaced, -2 station AROM copious clear fluid. IUPC/ISE placed Tracing: cat 1  Labs: Lab Results  Component Value Date   WBC 12.2 (H) 09/17/2022   HGB 10.7 (L) 09/17/2022   HCT 34.1 (L) 09/17/2022   MCV 85.7 09/17/2022   PLT 298 09/17/2022    Assessment / Plan: Latent phase Chronic HTN on med( procardia, labetalol GBS cx (+) on IV PCN P) cont increase pitocin. Positional changes( suspect OP presentation)   Anticipated MOD:  NSVD  Carrie Shaw A Carrie Shaw 09/17/2022, 11:55 AM

## 2022-09-17 NOTE — Anesthesia Preprocedure Evaluation (Signed)
Anesthesia Evaluation  Patient identified by MRN, date of birth, ID band Patient awake    Reviewed: Allergy & Precautions, Patient's Chart, lab work & pertinent test results, reviewed documented beta blocker date and time   Airway Mallampati: III       Dental no notable dental hx.    Pulmonary sleep apnea , former smoker   Pulmonary exam normal        Cardiovascular hypertension, Pt. on medications and Pt. on home beta blockers Normal cardiovascular exam     Neuro/Psych  PSYCHIATRIC DISORDERS Anxiety Depression    negative neurological ROS     GI/Hepatic Neg liver ROS,GERD  ,,  Endo/Other    Morbid obesity  Renal/GU negative Renal ROS  negative genitourinary   Musculoskeletal negative musculoskeletal ROS (+)    Abdominal  (+) + obese  Peds  Hematology  (+) Blood dyscrasia, anemia   Anesthesia Other Findings   Reproductive/Obstetrics (+) Pregnancy                             Anesthesia Physical Anesthesia Plan  ASA: 3  Anesthesia Plan: Epidural   Post-op Pain Management:    Induction:   PONV Risk Score and Plan:   Airway Management Planned: Natural Airway  Additional Equipment: None  Intra-op Plan:   Post-operative Plan:   Informed Consent: I have reviewed the patients History and Physical, chart, labs and discussed the procedure including the risks, benefits and alternatives for the proposed anesthesia with the patient or authorized representative who has indicated his/her understanding and acceptance.       Plan Discussed with: Anesthesiologist  Anesthesia Plan Comments:        Anesthesia Quick Evaluation

## 2022-09-17 NOTE — Progress Notes (Signed)
S: has epidural Last cytotec @ 1:15 am  O: VS BP 135/94 P77 RR 18  VE ext os flared internal os ft/thick/-3 Intracervical balloon placed via speculum  Tracing: baseline 130 (+) accels  Ctx q 2-4 mins  IMP: chronic HTN affecting pregnancy not in labor GBS cx (+) on IV pCN Obesity affecting pregnancy P) Pitocin augmentation. Continue IV PCN. Labetalol and procardia Pt did not get her pm dose of labetalol

## 2022-09-17 NOTE — Anesthesia Procedure Notes (Signed)
Epidural Patient location during procedure: OB Start time: 09/17/2022 2:37 AM End time: 09/17/2022 2:46 AM  Staffing Anesthesiologist: Mal Amabile, MD  Preanesthetic Checklist Completed: patient identified, IV checked, site marked, risks and benefits discussed, surgical consent, monitors and equipment checked, pre-op evaluation and timeout performed  Epidural Patient position: sitting Prep: DuraPrep and site prepped and draped Patient monitoring: continuous pulse ox and blood pressure Approach: midline Location: L3-L4 Injection technique: LOR air  Needle:  Needle type: Tuohy  Needle gauge: 17 G Needle length: 9 cm and 9 Needle insertion depth: 5 cm and 12 cm Catheter type: closed end flexible Catheter size: 19 Gauge Catheter at skin depth: 10 and 18 cm Test dose: negative and Other  Assessment Events: blood not aspirated, no cerebrospinal fluid, injection not painful, no injection resistance, no paresthesia and negative IV test  Additional Notes Patient identified. Risks and benefits discussed including failed block, incomplete  Pain control, post dural puncture headache, nerve damage, paralysis, blood pressure Changes, nausea, vomiting, reactions to medications-both toxic and allergic and post Partum back pain. All questions were answered. Patient expressed understanding and wished to proceed. Sterile technique was used throughout procedure. Epidural site was Dressed with sterile barrier dressing. No paresthesias, signs of intravascular injection Or signs of intrathecal spread were encountered.  Patient was more comfortable after the epidural was dosed. Please see RN's note for documentation of vital signs and FHR which are stable. Reason for block:procedure for pain

## 2022-09-17 NOTE — Progress Notes (Signed)
Carrie Shaw is a 36 y.o. [id number] at [redacted]w[redacted]d by LMP admitted for induction of labor due to Hypertension.  Subjective: No complaint  Objective: BP 139/73   Pulse 66   Temp 98 F (36.7 C) (Oral)   Resp 18   Ht 5\' 11"  (1.803 m)   Wt (!) 152.5 kg   LMP 12/24/2021   SpO2 99%   BMI 46.88 kg/m  No intake/output data recorded. Total I/O In: -  Out: 950 [Urine:950] Pitocin 12 miu FHT:  FHR: 140 bpm, variability: moderate,  accelerations:  Present,  decelerations:  Present variables UC:   irregular, every 2-4 minutes SVE:   4 cm dilated, 60-70% effaced, -2 station Removed ice glove from vagina  Labs: Lab Results  Component Value Date   WBC 12.2 (H) 09/17/2022   HGB 10.7 (L) 09/17/2022   HCT 34.1 (L) 09/17/2022   MCV 85.7 09/17/2022   PLT 298 09/17/2022    Assessment / Plan: Latent phase Chronic HTN on med GBS cx positive on IV PCN IUP @ 38 1/7 wk  P) cont with pitocin. Cont with IV PCN. BP meds   Anticipated MOD:   guarded  Carrie Shaw 09/17/2022, 4:27 PM

## 2022-09-18 ENCOUNTER — Encounter: Payer: Self-pay | Admitting: Hematology and Oncology

## 2022-09-18 ENCOUNTER — Encounter (HOSPITAL_COMMUNITY): Payer: Self-pay | Admitting: Obstetrics and Gynecology

## 2022-09-18 ENCOUNTER — Ambulatory Visit: Payer: BC Managed Care – PPO | Admitting: Cardiology

## 2022-09-18 LAB — CBC WITH DIFFERENTIAL/PLATELET
Abs Immature Granulocytes: 0.06 10*3/uL (ref 0.00–0.07)
Basophils Absolute: 0 10*3/uL (ref 0.0–0.1)
Basophils Relative: 0 %
Eosinophils Absolute: 0 10*3/uL (ref 0.0–0.5)
Eosinophils Relative: 0 %
HCT: 34.9 % — ABNORMAL LOW (ref 36.0–46.0)
Hemoglobin: 11.3 g/dL — ABNORMAL LOW (ref 12.0–15.0)
Immature Granulocytes: 0 %
Lymphocytes Relative: 8 %
Lymphs Abs: 1.3 10*3/uL (ref 0.7–4.0)
MCH: 27.1 pg (ref 26.0–34.0)
MCHC: 32.4 g/dL (ref 30.0–36.0)
MCV: 83.7 fL (ref 80.0–100.0)
Monocytes Absolute: 1.2 10*3/uL — ABNORMAL HIGH (ref 0.1–1.0)
Monocytes Relative: 8 %
Neutro Abs: 12.7 10*3/uL — ABNORMAL HIGH (ref 1.7–7.7)
Neutrophils Relative %: 84 %
Platelets: 293 10*3/uL (ref 150–400)
RBC: 4.17 MIL/uL (ref 3.87–5.11)
RDW: 23.8 % — ABNORMAL HIGH (ref 11.5–15.5)
Smear Review: NORMAL
WBC: 15.4 10*3/uL — ABNORMAL HIGH (ref 4.0–10.5)
nRBC: 0 % (ref 0.0–0.2)

## 2022-09-18 MED ORDER — ACETAMINOPHEN 325 MG PO TABS
650.0000 mg | ORAL_TABLET | ORAL | Status: DC | PRN
  Administered 2022-09-18 – 2022-09-19 (×5): 650 mg via ORAL
  Filled 2022-09-18 (×5): qty 2

## 2022-09-18 MED ORDER — ONDANSETRON HCL 4 MG/2ML IJ SOLN: 4.0000 mg | INTRAMUSCULAR | Status: DC | PRN

## 2022-09-18 MED ORDER — HYDRALAZINE HCL 20 MG/ML IJ SOLN: 10.0000 mg | INTRAMUSCULAR | Status: DC | PRN

## 2022-09-18 MED ORDER — SENNOSIDES-DOCUSATE SODIUM 8.6-50 MG PO TABS
2.0000 | ORAL_TABLET | Freq: Every day | ORAL | Status: DC
  Administered 2022-09-19: 2 via ORAL
  Filled 2022-09-18: qty 2

## 2022-09-18 MED ORDER — BENZOCAINE-MENTHOL 20-0.5 % EX AERO
1.0000 | INHALATION_SPRAY | CUTANEOUS | Status: DC | PRN
  Administered 2022-09-18: 1 via TOPICAL
  Filled 2022-09-18: qty 56

## 2022-09-18 MED ORDER — DIBUCAINE (PERIANAL) 1 % EX OINT: 1.0000 | TOPICAL_OINTMENT | CUTANEOUS | Status: DC | PRN

## 2022-09-18 MED ORDER — ZOLPIDEM TARTRATE 5 MG PO TABS: 5.0000 mg | ORAL_TABLET | Freq: Every evening | ORAL | Status: DC | PRN

## 2022-09-18 MED ORDER — PRENATAL MULTIVITAMIN CH
1.0000 | ORAL_TABLET | Freq: Every day | ORAL | Status: DC
  Administered 2022-09-18 – 2022-09-19 (×2): 1 via ORAL
  Filled 2022-09-18 (×2): qty 1

## 2022-09-18 MED ORDER — ONDANSETRON HCL 4 MG PO TABS: 4.0000 mg | ORAL_TABLET | ORAL | Status: DC | PRN

## 2022-09-18 MED ORDER — SIMETHICONE 80 MG PO CHEW: 80.0000 mg | CHEWABLE_TABLET | ORAL | Status: DC | PRN

## 2022-09-18 MED ORDER — FERROUS SULFATE 325 (65 FE) MG PO TABS
325.0000 mg | ORAL_TABLET | Freq: Two times a day (BID) | ORAL | Status: DC
  Administered 2022-09-18 – 2022-09-19 (×3): 325 mg via ORAL
  Filled 2022-09-18 (×3): qty 1

## 2022-09-18 MED ORDER — LABETALOL HCL 5 MG/ML IV SOLN: 40.0000 mg | INTRAVENOUS | Status: DC | PRN

## 2022-09-18 MED ORDER — DIPHENHYDRAMINE HCL 25 MG PO CAPS: 25.0000 mg | ORAL_CAPSULE | Freq: Four times a day (QID) | ORAL | Status: DC | PRN

## 2022-09-18 MED ORDER — LABETALOL HCL 5 MG/ML IV SOLN
20.0000 mg | INTRAVENOUS | Status: DC | PRN
  Administered 2022-09-18: 20 mg via INTRAVENOUS
  Filled 2022-09-18: qty 4

## 2022-09-18 MED ORDER — FUROSEMIDE 20 MG PO TABS
40.0000 mg | ORAL_TABLET | Freq: Every day | ORAL | Status: DC
  Administered 2022-09-18: 40 mg via ORAL
  Filled 2022-09-18: qty 2

## 2022-09-18 MED ORDER — POTASSIUM CHLORIDE CRYS ER 20 MEQ PO TBCR
20.0000 meq | EXTENDED_RELEASE_TABLET | Freq: Every day | ORAL | Status: DC
  Administered 2022-09-18 – 2022-09-19 (×2): 20 meq via ORAL
  Filled 2022-09-18 (×2): qty 1

## 2022-09-18 MED ORDER — COCONUT OIL OIL: 1.0000 | TOPICAL_OIL | Status: DC | PRN

## 2022-09-18 MED ORDER — FUROSEMIDE 20 MG PO TABS
40.0000 mg | ORAL_TABLET | Freq: Two times a day (BID) | ORAL | Status: DC
  Administered 2022-09-18 – 2022-09-19 (×2): 40 mg via ORAL
  Filled 2022-09-18 (×2): qty 2

## 2022-09-18 MED ORDER — LABETALOL HCL 5 MG/ML IV SOLN: 80.0000 mg | INTRAVENOUS | Status: DC | PRN

## 2022-09-18 MED ORDER — IBUPROFEN 600 MG PO TABS
600.0000 mg | ORAL_TABLET | Freq: Four times a day (QID) | ORAL | Status: DC
  Filled 2022-09-18: qty 1

## 2022-09-18 MED ORDER — WITCH HAZEL-GLYCERIN EX PADS: 1.0000 | MEDICATED_PAD | CUTANEOUS | Status: DC | PRN

## 2022-09-18 NOTE — Anesthesia Postprocedure Evaluation (Signed)
Anesthesia Post Note  Patient: Carrie Shaw  Procedure(s) Performed: AN AD HOC LABOR EPIDURAL     Patient location during evaluation: Mother Baby Anesthesia Type: Epidural Level of consciousness: awake Pain management: satisfactory to patient Vital Signs Assessment: post-procedure vital signs reviewed and stable Respiratory status: spontaneous breathing Cardiovascular status: stable Anesthetic complications: no   No notable events documented.  Last Vitals:  Vitals:   09/18/22 0407 09/18/22 0730  BP: (!) 144/84 (!) 142/78  Pulse: 74 84  Resp: 18 18  Temp: 36.9 C 36.7 C  SpO2: 100% 100%    Last Pain:  Vitals:   09/18/22 0730  TempSrc: Oral  PainSc: 2    Pain Goal: Patients Stated Pain Goal: 2 (09/18/22 0730)                 Cephus Shelling

## 2022-09-18 NOTE — Lactation Note (Signed)
This note was copied from a baby's chart. Lactation Consultation Note  Patient Name: Carrie Shaw ZDGLO'V Date: 09/18/2022 Reason for consult: Initial assessment;Early term 37-38.6wks;1st time breastfeeding Age:35 hours   P3: Early term infant at 38+2 weeks Feeding preference: Breast/formula  "Carrie Shaw" was swaddled and asleep in father's arms when I arrived.  Mother reported that baby latched and breast fed since delivery.    Breast feeding basics reviewed.  Taught hand expression; no drops noted.  Demonstrated finger feeding/spoon feeding any drops to "Carrie Shaw."  Encouraged to feed 8-12 times/24 hours and to call her RN/LC for latch assistance as needed.  Mother has her personal pump at bedside.  Informed her that she does not have to pump at this time unless she desires to do this.  Discussed baby's tummy size and suggested lots of STS, breast massage and hand expression.     Maternal Data Has patient been taught Hand Expression?: Yes Does the patient have breastfeeding experience prior to this delivery?: No (Mother reported that her first two children did not latch and she "gave up")  Feeding Mother's Current Feeding Choice: Breast Milk and Formula  LATCH Score                    Lactation Tools Discussed/Used    Interventions Interventions: Breast feeding basics reviewed;Education;LC Services brochure  Discharge Pump: Personal (Motif)  Consult Status Consult Status: Follow-up Date: 09/19/22 Follow-up type: In-patient    Corey Laski R Antavion Bartoszek 09/18/2022, 8:15 AM

## 2022-09-18 NOTE — Progress Notes (Signed)
Called by RN regarding decelerations and request for amnioinfusion order S: no complaint  O: VS BP 141/84 P72  Pitocin 24 miu VE per RN 4-5/-1 station Tracing reviewed: good variability. ? Baseline 125 (+) variables Ctx q 2-5 mins Amnioinfusion started  IMP: variable decelerations Chronic HTN on procardia and labetalol GBS cx (+) on IV PCN Latent phase P) pitocin break  x 1 hr . Tums intake and serial positional changes. Resume pitocin in one hour.

## 2022-09-18 NOTE — Progress Notes (Signed)
PPD{NUMBERS 1-5:20334} SVD:   S:  Pt reports feeling ***/ Tolerating po/ Voiding without problems/ No n/v/ Bleeding is {Description; bleeding vaginal:11356}/ Pain controlled with{treatments; pain control med:13496}  Newborn info ***   O:  A & O x 3 ***/ VS: Blood pressure (!) 136/95, pulse 79, temperature 98.5 F (36.9 C), temperature source Oral, resp. rate 18, height 5\' 11"  (1.803 m), weight (!) 152.5 kg, last menstrual period 12/24/2021, SpO2 100 %, unknown if currently breastfeeding.  LABS:  Results for orders placed or performed during the hospital encounter of 09/16/22 (from the past 24 hour(s))  CBC with Differential/Platelet     Status: Abnormal   Collection Time: 09/18/22  2:15 AM  Result Value Ref Range   WBC 15.4 (H) 4.0 - 10.5 K/uL   RBC 4.17 3.87 - 5.11 MIL/uL   Hemoglobin 11.3 (L) 12.0 - 15.0 g/dL   HCT 09/20/22 (L) 88.1 - 10.3 %   MCV 83.7 80.0 - 100.0 fL   MCH 27.1 26.0 - 34.0 pg   MCHC 32.4 30.0 - 36.0 g/dL   RDW 15.9 (H) 45.8 - 59.2 %   Platelets 293 150 - 400 K/uL   nRBC 0.0 0.0 - 0.2 %   Neutrophils Relative % 84 %   Neutro Abs 12.7 (H) 1.7 - 7.7 K/uL   Lymphocytes Relative 8 %   Lymphs Abs 1.3 0.7 - 4.0 K/uL   Monocytes Relative 8 %   Monocytes Absolute 1.2 (H) 0.1 - 1.0 K/uL   Eosinophils Relative 0 %   Eosinophils Absolute 0.0 0.0 - 0.5 K/uL   Basophils Relative 0 %   Basophils Absolute 0.0 0.0 - 0.1 K/uL   WBC Morphology MORPHOLOGY UNREMARKABLE    RBC Morphology MORPHOLOGY UNREMARKABLE    Smear Review Normal platelet morphology    Immature Granulocytes 0 %   Abs Immature Granulocytes 0.06 0.00 - 0.07 K/uL    I&O: I/O last 3 completed shifts: In: -  Out: 5406 [Urine:5300; Blood:106]   No intake/output data recorded.  Lungs: {Exam; lungs:5033}  Heart: {Exam; heart:5510}  Abdomen: {PE ABDOMEN POSTPARTUM OBGYN:313106}  Perineum: {exam; perineum:10172}  Lochia: ***  Extremities:{pe extremities 92.4    A/P: PPD # ***/ MQ:286381}  Doing  well  Continue routine post partum orders  ***

## 2022-09-19 ENCOUNTER — Ambulatory Visit: Payer: BC Managed Care – PPO | Admitting: Cardiology

## 2022-09-19 LAB — CBC
HCT: 32.1 % — ABNORMAL LOW (ref 36.0–46.0)
Hemoglobin: 10.5 g/dL — ABNORMAL LOW (ref 12.0–15.0)
MCH: 27.7 pg (ref 26.0–34.0)
MCHC: 32.7 g/dL (ref 30.0–36.0)
MCV: 84.7 fL (ref 80.0–100.0)
Platelets: 280 10*3/uL (ref 150–400)
RBC: 3.79 MIL/uL — ABNORMAL LOW (ref 3.87–5.11)
RDW: 24.1 % — ABNORMAL HIGH (ref 11.5–15.5)
WBC: 14.2 10*3/uL — ABNORMAL HIGH (ref 4.0–10.5)
nRBC: 0 % (ref 0.0–0.2)

## 2022-09-19 LAB — COMPREHENSIVE METABOLIC PANEL
ALT: 23 U/L (ref 0–44)
AST: 48 U/L — ABNORMAL HIGH (ref 15–41)
Albumin: 2.6 g/dL — ABNORMAL LOW (ref 3.5–5.0)
Alkaline Phosphatase: 109 U/L (ref 38–126)
Anion gap: 7 (ref 5–15)
BUN: 7 mg/dL (ref 6–20)
CO2: 21 mmol/L — ABNORMAL LOW (ref 22–32)
Calcium: 8.9 mg/dL (ref 8.9–10.3)
Chloride: 109 mmol/L (ref 98–111)
Creatinine, Ser: 0.67 mg/dL (ref 0.44–1.00)
GFR, Estimated: >60 mL/min (ref >60)
Glucose, Bld: 92 mg/dL (ref 70–99)
Potassium: 4 mmol/L (ref 3.5–5.1)
Sodium: 137 mmol/L (ref 135–145)
Total Bilirubin: 0.3 mg/dL (ref 0.3–1.2)
Total Protein: 6.4 g/dL — ABNORMAL LOW (ref 6.5–8.1)

## 2022-09-19 MED ORDER — FUROSEMIDE 40 MG PO TABS: 40.0000 mg | ORAL_TABLET | Freq: Two times a day (BID) | ORAL | 0 refills | Status: DC

## 2022-09-19 NOTE — Discharge Summary (Signed)
Postpartum Discharge Summary  Date of Service updated      Patient Name: Carrie Shaw DOB: 07-02-1986 MRN: [id number]  Date of admission: 09/16/2022 Delivery date:09/18/2022  Delivering provider: Ahonesty Woodfin  Date of discharge: 09/19/2022  Admitting diagnosis: Benign essential HTN, chronic, antepartum, third trimester [O10.013] Postpartum care following vaginal delivery [Z39.2] Intrauterine pregnancy: [redacted]w[redacted]d    Secondary diagnosis:  Principal Problem:   Benign essential HTN, chronic, antepartum, third trimester Active Problems:   Postpartum care following vaginal delivery  Additional problems: gastric sleeve affecting pregnancy, GBS cx positive. obesity   Discharge diagnosis: Term Pregnancy Delivered and CHTN , GBS cx positive                                             Post partum procedures: n/a Augmentation: Pitocin and IP Foley Complications: None  Hospital course: Induction of Labor With Vaginal Delivery   36y.o. yo [id number] admitted to the hospital 09/16/2022 for induction of labor.  Indication for induction:  HTN .  Patient had an labor course complicated by protracted latent phase. Cervical ripening. Intracervical balloon and pitocin augmentation. Epidural. IV PCN. Amnioinfusion for variable deceleration Membrane Rupture Time/Date: 11:42 AM ,09/17/2022   Delivery Method:Vaginal, Spontaneous  Episiotomy: None  Lacerations:  None  Details of delivery can be found in separate delivery note.  Patient had a postpartum course complicated by adjusting BP meds under guidance of her cardiologist. Patient is discharged home 09/19/2022.  Newborn Data: Birth date:09/18/2022  Birth time:1:25 AM  Gender:Female  Living status:Living  Apgars:9 ,9  Weight:3.33 kg   Magnesium Sulfate received: No BMZ received: No Rhophylac:N/A MMR:No T-DaP:Given prenatally Flu: No Transfusion:No  Physical exam  Vitals:   09/18/22 1220 09/18/22 1547  09/18/22 2141 09/19/22 0533  BP: 129/86 (!) 136/95 133/83 129/85  Pulse: 95 79 80 76  Resp: _0 Temp: 98 F (36.7 C) 98.5 F (36.9 C) 97.8 F (36.6 C) 98.1 F (36.7 C)  TempSrc: Oral Oral Axillary Oral  SpO2: 100% 100%  100%  Weight:      Height:       General: alert, cooperative, and no distress Lochia: appropriate Uterine Fundus: firm Incision: N/A DVT Evaluation: No evidence of DVT seen on physical exam. No significant calf/ankle edema. Labs: Lab Results  Component Value Date   WBC 14.2 (H) 09/19/2022   HGB 10.5 (L) 09/19/2022   HCT 32.1 (L) 09/19/2022   MCV 84.7 09/19/2022   PLT 280 09/19/2022      Latest Ref Rng & Units 09/19/2022   12:03 AM  CMP  Glucose 70 - 99 mg/dL 92   BUN 6 - 20 mg/dL 7   Creatinine 0.44 - 1.00 mg/dL 0.67   Sodium 135 - 145 mmol/L 137   Potassium 3.5 - 5.1 mmol/L 4.0   Chloride 98 - 111 mmol/L 109   CO2 22 - 32 mmol/L 21   Calcium 8.9 - 10.3 mg/dL 8.9   Total Protein 6.5 - 8.1 g/dL 6.4   Total Bilirubin 0.3 - 1.2 mg/dL 0.3   Alkaline Phos 38 - 126 U/L 109   AST 15 - 41 U/L 48   ALT 0 - 44 U/L 23    Edinburgh Score:    09/18/2022    4:07 AM  Edinburgh Postnatal Depression Scale Screening Tool  I  have been able to laugh and see the funny side of things. 0  I have looked forward with enjoyment to things. 0  I have blamed myself unnecessarily when things went wrong. 1  I have been anxious or worried for no good reason. 2  I have felt scared or panicky for no good reason. 1  Things have been getting on top of me. 1  I have been so unhappy that I have had difficulty sleeping. 0  I have felt sad or miserable. 1  I have been so unhappy that I have been crying. 0  The thought of harming myself has occurred to me. 0  Edinburgh Postnatal Depression Scale Total 6      After visit meds:  Allergies as of 09/19/2022   No Known Allergies      Medication List     TAKE these medications    cyanocobalamin 1000 MCG  tablet Commonly known as: VITAMIN B12 Take 1 tablet (1,000 mcg total) by mouth daily.   furosemide 40 MG tablet Commonly known as: LASIX Take 1 tablet (40 mg total) by mouth 2 (two) times daily for 2 days. What changed: when to take this   labetalol 200 MG tablet Commonly known as: NORMODYNE Take 1 tablet (200 mg total) by mouth every 8 (eight) hours.   NIFEdipine 60 MG 24 hr tablet Commonly known as: PROCARDIA XL/NIFEDICAL XL Take 1 tablet (60 mg total) by mouth daily.   potassium chloride SA 20 MEQ tablet Commonly known as: KLOR-CON M Take 1 tablet (20 mEq total) by mouth daily.   PRENATAL VITAMIN PO Take by mouth daily in the afternoon.   TYLENOL PO Take by mouth daily in the afternoon. Chews   Vitamin D (Ergocalciferol) 1.25 MG (50000 UNIT) Caps capsule Commonly known as: DRISDOL Take 1 capsule (50,000 Units total) by mouth every 7 (seven) days.         Discharge home in stable condition Infant Feeding: Bottle and Breast Infant Disposition:home with mother Discharge instruction: per After Visit Summary and Postpartum booklet. Activity: Advance as tolerated. Pelvic rest for 6 weeks.  Diet: routine diet Anticipated Birth Control: Unsure Postpartum Appointment:6 weeks, f/u with Dr Harriet Masson Additional Postpartum F/U:  n/a Future Appointments: Future Appointments  Date Time Provider [place] [facility] [place]  09/20/2022 10:30 AM [facility] NURSE [facility]  09/20/2022 10:45 AM [facility] NST [facility]  09/27/2022  1:00 PM [facility] NURSE [facility]  09/27/2022  1:15 PM [facility] NST [facility]  11/10/2022  9:45 AM Iruku, Arletha Pili, MD [facility] None   Follow up Visit:  Follow-up Information     Servando Salina, MD Follow up in 6 week(s).   Specialty: Obstetrics and Gynecology Contact information: [place] [address] [phone]         Berniece Salines, DO Follow up.   Specialty: Cardiology Why: schedule appt with Dr Harriet Masson for BP  mgmt Contact information: [address] [place] [address] [phone]                     09/19/2022 Marvene Staff, MD

## 2022-09-19 NOTE — Lactation Note (Signed)
This note was copied from a baby's chart. Lactation Consultation Note  Patient Name: Carrie Shaw [id number] Date: 09/19/2022 Reason for consult: Follow-up assessment;Difficult latch;Early term 37-38.6wks;Breastfeeding assistance;Infant weight loss (4.05% WL) Age:35 hours  LC entered the room and the infant was being held by a support person.  Per the birth parent, things have been going well with breastfeeding.  She stated that the infant was latching, but she wanted to start pumping when she gets home.  LC encouraged the birth parent to pump every 3 hour if the infant is not feeding well or if the infant does not feed for at least 15 min at the breast.  The birth parent is aware that the infant should be actively feeding throughout the feeding.  LC reviewed engorgement, breast care, warning signs, and infant I/O.  The birth parent is aware of the outpatient LC services.   Infant Feeding Plan:  Breastfeed 8+ times in 24 hours according to feeding cues.  Put the infant to the breast prior to supplementing.  Pump every 3 hours if the infant is not latching or if the feeding is less than 15 min long.  Watch infant output and call the pediatrician with questions or concerns.  Call outpatient [facility] for assistance with breastfeeding.   Feeding Mother's Current Feeding Choice: Breast Milk and Formula Nipple Type: Extra Slow Flow  Interventions Interventions: Breast feeding basics reviewed;Education  Discharge Discharge Education: Engorgement and breast care;Warning signs for feeding baby;Outpatient recommendation  Consult Status Consult Status: Complete Date: 09/19/22 Follow-up type: Call as needed    Delene Loll 09/19/2022, 10:32 AM

## 2022-09-19 NOTE — Progress Notes (Signed)
PPD{NUMBERS 1-5:20334} SVD:   S:  Pt reports feeling ***/ Tolerating po/ Voiding without problems/ No n/v/ Bleeding is {Description; bleeding vaginal:11356}/ Pain controlled with{treatments; pain control med:13496}  Newborn info ***   O:  A & O x 3 ***/ VS: Blood pressure 129/85, pulse 76, temperature 98.1 F (36.7 C), temperature source Oral, resp. rate 18, height 5\' 11"  (1.803 m), weight (!) 152.5 kg, last menstrual period 12/24/2021, SpO2 100 %, unknown if currently breastfeeding.  LABS:  Results for orders placed or performed during the hospital encounter of 09/16/22 (from the past 24 hour(s))  Comprehensive metabolic panel     Status: Abnormal   Collection Time: 09/19/22 12:03 AM  Result Value Ref Range   Sodium 137 135 - 145 mmol/L   Potassium 4.0 3.5 - 5.1 mmol/L   Chloride 109 98 - 111 mmol/L   CO2 21 (L) 22 - 32 mmol/L   Glucose, Bld 92 70 - 99 mg/dL   BUN 7 6 - 20 mg/dL   Creatinine, Ser 09/21/22 0.44 - 1.00 mg/dL   Calcium 8.9 8.9 - 1.24 mg/dL   Total Protein 6.4 (L) 6.5 - 8.1 g/dL   Albumin 2.6 (L) 3.5 - 5.0 g/dL   AST 48 (H) 15 - 41 U/L   ALT 23 0 - 44 U/L   Alkaline Phosphatase 109 38 - 126 U/L   Total Bilirubin 0.3 0.3 - 1.2 mg/dL   GFR, Estimated 58.0 >99 mL/min   Anion gap 7 5 - 15  CBC     Status: Abnormal   Collection Time: 09/19/22  3:21 AM  Result Value Ref Range   WBC 14.2 (H) 4.0 - 10.5 K/uL   RBC 3.79 (L) 3.87 - 5.11 MIL/uL   Hemoglobin 10.5 (L) 12.0 - 15.0 g/dL   HCT 09/21/22 (L) 38.2 - 50.5 %   MCV 84.7 80.0 - 100.0 fL   MCH 27.7 26.0 - 34.0 pg   MCHC 32.7 30.0 - 36.0 g/dL   RDW 39.7 (H) 67.3 - 41.9 %   Platelets 280 150 - 400 K/uL   nRBC 0.0 0.0 - 0.2 %    I&O: I/O last 3 completed shifts: In: -  Out: 2706 [Urine:2600; Blood:106]   No intake/output data recorded.  Lungs: {Exam; lungs:5033}  Heart: {Exam; heart:5510}  Abdomen: {PE ABDOMEN POSTPARTUM OBGYN:313106}  Perineum: {exam; perineum:10172}  Lochia: ***  Extremities:{pe extremities  2707    A/P: PPD # ***/ KW:409735}  Doing well  Continue routine post partum orders  ***

## 2022-09-19 NOTE — Social Work (Incomplete)
CSW received consult for hx of Anxiety and Depression.  CSW met with MOB to offer support and complete assessment. CSW met with MOB in the hallway due to multiple room guests. MOB presented pleasant and engaged throughout the assessment.   CSW inquired about how MOB   CSW provided education regarding the baby blues period vs. perinatal mood disorders, discussed treatment and gave resources for mental health follow up if concerns arise.  CSW recommends self-evaluation during the postpartum time period using the New Mom Checklist from Postpartum Progress and encouraged MOB to contact a medical professional if symptoms are noted at any time.   CSW provided review of Sudden Infant Death Syndrome (SIDS) precautions.   CSW identifies no further need for intervention and no barriers to discharge at this time.

## 2022-09-20 ENCOUNTER — Ambulatory Visit: Payer: BC Managed Care – PPO

## 2022-09-21 ENCOUNTER — Telehealth: Payer: BC Managed Care – PPO | Admitting: Cardiology

## 2022-09-27 ENCOUNTER — Ambulatory Visit: Payer: BC Managed Care – PPO

## 2022-09-28 ENCOUNTER — Telehealth (HOSPITAL_COMMUNITY): Payer: Self-pay

## 2022-09-28 NOTE — Telephone Encounter (Signed)
Patient reports doing fine. Patient declines questions/concerns about her health and healing.  Patient reports that baby is doing good. "I noticed a knot on the back of her head. I just noticed it. I'm not sure if it's new or if it's been there and I just didn't notice it before." Patient reports baby's behavior is normal and has not changed. RN told patient to call her pediatrician after phone call and speak with them about her concern. Patient reports baby is eating, peeing/pooping, and sleeping well. Patient states baby has an appointment with her pediatrician on Tuesday. Baby sleeps in a crib. RN reviewed ABC's of safe sleep with patient. Patient declines any questions or concerns about baby.  EPDS- Patient states that she is walking into her apt with her OB and could we call her back at a later time.  Marcelino Duster [facility] 09/28/22,1355

## 2022-09-29 ENCOUNTER — Telehealth (HOSPITAL_COMMUNITY): Payer: Self-pay | Admitting: *Deleted

## 2022-09-29 NOTE — Telephone Encounter (Signed)
Call back for EPDS. Left voicemail message.  Duffy Rhody, RN 09-29-2022 at 9:55am

## 2022-10-13 ENCOUNTER — Other Ambulatory Visit: Payer: Self-pay | Admitting: Cardiology

## 2022-10-16 ENCOUNTER — Encounter: Payer: Self-pay | Admitting: Cardiology

## 2022-10-16 ENCOUNTER — Ambulatory Visit: Payer: BC Managed Care – PPO | Attending: Cardiology | Admitting: Cardiology

## 2022-10-16 ENCOUNTER — Telehealth: Payer: Self-pay

## 2022-10-16 VITALS — BP 114/52 | HR 68 | Ht 71.0 in | Wt 295.0 lb

## 2022-10-16 DIAGNOSIS — I10 Essential (primary) hypertension: Secondary | ICD-10-CM

## 2022-10-16 DIAGNOSIS — O165 Unspecified maternal hypertension, complicating the puerperium: Secondary | ICD-10-CM

## 2022-10-16 DIAGNOSIS — Z8759 Personal history of other complications of pregnancy, childbirth and the puerperium: Secondary | ICD-10-CM

## 2022-10-16 MED ORDER — FUROSEMIDE 40 MG PO TABS: 40.0000 mg | ORAL_TABLET | ORAL | 3 refills | Status: DC

## 2022-10-16 MED ORDER — POTASSIUM CHLORIDE CRYS ER 20 MEQ PO TBCR: 20.0000 meq | EXTENDED_RELEASE_TABLET | ORAL | 3 refills | Status: DC

## 2022-10-16 NOTE — Progress Notes (Signed)
Cardio-Obstetrics Clinic  Follow Up Note   Date:  10/16/2022   ID:  Carrie Shaw, DOB 10-01-1986, MRN [id number]  PCP:  Aliene Beams, MD   [facility] Providers Cardiologist:  Thomasene Ripple, DO  Electrophysiologist:  None        Referring MD: Aliene Beams, MD   Chief Complaint:  " I am doing ok:"  The patient is at home. I am in Clinic.  Virtual Visit via Video  Note . I connected with the patient today by a   video enabled telemedicine application and verified that I am speaking with the correct person using two identifiers.   History of Present Illness:    Carrie Shaw is a 37 y.o. female [[id number]] who returns for follow up of postpartum hypertension.  No specific complaints - she lost her uncle today.  Prior CV Studies Reviewed: The following studies were reviewed today:   Past Medical History:  Diagnosis Date   ADD (attention deficit disorder)    Anemia    Anxiety    B12 deficiency    Back pain    Bilateral swelling of feet    Depression    Gallbladder problem    HSV infection    Hypertension    Joint pain    Lower extremity edema    Obesity    Pregnancy induced hypertension    Sleep apnea    mild no mask   SOB (shortness of breath)    SVD (spontaneous vaginal delivery)    x 2   Termination of pregnancy (fetus)    x 2   UTI (urinary tract infection)    Vitamin B12 deficiency    Vitamin D deficiency     Past Surgical History:  Procedure Laterality Date   APPENDECTOMY     CYSTOSCOPY N/A 08/16/2017   Procedure: CYSTOSCOPY;  Surgeon: Conan Bowens, MD;  Location: [facility];  Service: Gynecology;  Laterality: N/A;   GASTRIC ROUX-EN-Y N/A 01/01/2018   Procedure: LAPAROSCOPIC ROUX-EN-Y GASTRIC BYPASS WITH UPPER ENDOSCOPY;  Surgeon: Gaynelle Adu, MD;  Location: [facility];  Service: General;  Laterality: N/A;   LAPAROSCOPY N/A 08/16/2017   Procedure: LAPAROSCOPY DIAGNOSTIC;  Surgeon: Conan Bowens, MD;  Location: [facility];   Service: Gynecology;  Laterality: N/A;   LAPAROTOMY N/A 08/16/2017   Procedure: EXPLORATORY LAPAROTOMY WITH REMOVAL OF LEFT ECTOPIC PREGNANCY, LEFT SALPINGECTOMY AND CYSTOTOMY OF BLADDER;  Surgeon: Conan Bowens, MD;  Location: [facility];  Service: Gynecology;  Laterality: N/A;   LYSIS OF ADHESION N/A 08/16/2017   Procedure: LYSIS OF ADHESION;  Surgeon: Conan Bowens, MD;  Location: [facility];  Service: Gynecology;  Laterality: N/A;   Tummy tuck     10/2019   UNILATERAL SALPINGECTOMY Left 08/16/2017   Procedure: UNILATERAL SALPINGECTOMY;  Surgeon: Conan Bowens, MD;  Location: [facility];  Service: Gynecology;  Laterality: Left;      OB History     Gravida  5   Para  3   Term  3   Preterm      AB  2   Living  3      SAB  0   IAB  2   Ectopic      Multiple  0   Live Births  3               Current Medications: Current Meds  Medication Sig   cyanocobalamin (VITAMIN B12) 1000 MCG tablet Take 1 tablet (1,000 mcg total)  by mouth daily.   labetalol (NORMODYNE) 200 MG tablet TAKE 1 TABLET BY MOUTH EVERY 8 HOURS.   NIFEdipine (PROCARDIA XL/NIFEDICAL XL) 60 MG 24 hr tablet Take 1 tablet (60 mg total) by mouth daily.   potassium chloride SA (KLOR-CON M) 20 MEQ tablet Take 1 tablet (20 mEq total) by mouth daily.   Prenatal Vit-Fe Fumarate-FA (PRENATAL VITAMIN PO) Take by mouth daily in the afternoon.   Vitamin D, Ergocalciferol, (DRISDOL) 1.25 MG (50000 UNIT) CAPS capsule Take 1 capsule (50,000 Units total) by mouth every 7 (seven) days.     Allergies:   Patient has no known allergies.   Social History   Socioeconomic History   Marital status: Married    Spouse name: Jaci Standard   Number of children: 2   Years of education: Not on file   Highest education level: Not on file  Occupational History   Occupation: Therapist, art Rep  Tobacco Use   Smoking status: Former    Packs/day: 0.25    Years: 10.00    Total pack years: 2.50    Types: Cigarettes    Quit date:  11/2021    Years since quitting: 0.8   Smokeless tobacco: Never  Vaping Use   Vaping Use: Never used  Substance and Sexual Activity   Alcohol use: Not Currently    Comment: rare   Drug use: No   Sexual activity: Yes    Partners: Male    Birth control/protection: None  Other Topics Concern   Not on file  Social History Narrative   She works at [employer]    Two children   Not married       She likes to shop and travel    Social Determinants of Radio broadcast assistant Strain: Not on file  Food Insecurity: No [place] (09/16/2022)   Hunger Vital Sign    Worried About Running Out of Food in the Last Year: Never true    Ran Out of Food in the Last Year: Never true  Transportation Needs: No Transportation Needs (09/16/2022)   PRAPARE - Hydrologist (Medical): No    Lack of Transportation (Non-Medical): No  Physical Activity: Not on file  Stress: Not on file  Social Connections: Not on file      Family History  Problem Relation Age of Onset   Depression Mother    Hypertension Mother    Anxiety disorder Mother    Sleep apnea Mother    Obesity Mother    Eating disorder Mother    Hypertension Father    Depression Father    Anxiety disorder Father    Sleep apnea Father    Alcoholism Father    Drug abuse Father    Obesity Father    Schizophrenia Sister    Bipolar disorder Sister       ROS:   Please see the history of present illness.     All other systems reviewed and are negative.   Labs/EKG Reviewed:    EKG:   EKG is was not ordered today.    Recent Labs: 07/04/2022: TSH 1.490 09/05/2022: Magnesium 1.7 09/19/2022: ALT 23; BUN 7; Creatinine, Ser 0.67; Hemoglobin 10.5; Platelets 280; Potassium 4.0; Sodium 137   Recent Lipid Panel Lab Results  Component Value Date/Time   CHOL 195 07/04/2022 09:45 AM   TRIG 96 07/04/2022 09:45 AM   HDL 59 07/04/2022 09:45 AM   CHOLHDL 3.4 12/25/2014 12:27 PM   LDLCALC  119 (H)  07/04/2022 09:45 AM    Physical Exam:    VS:  BP (!) 114/52   Pulse 68   Ht 5\' 11"  (1.803 m)   Wt 133.8 kg   BMI 41.14 kg/m     Wt Readings from Last 3 Encounters:  10/16/22 133.8 kg  09/16/22 (!) 152.5 kg  09/05/22 (!) 151.7 kg     GEN:  Well nourished, well developed in no acute distress HEENT: Normal NECK: No JVD; No carotid bruits LYMPHATICS: No lymphadenopathy CARDIAC: RRR, no murmurs, rubs, gallops RESPIRATORY:  Clear to auscultation without rales, wheezing or rhonchi  ABDOMEN: Soft, non-tender, non-distended MUSCULOSKELETAL:  No edema; No deformity  SKIN: Warm and dry NEUROLOGIC:  Alert and oriented x 3 PSYCHIATRIC:  Normal affect    Risk Assessment/Risk Calculators:     CARPREG II Risk Prediction Index Score:  1.  The patient's risk for a primary cardiac event is 5%.            ASSESSMENT & PLAN:    Hx of gestational hypertension Postpartum hypertension  obesity  Her blood pressure is acceptable today.  She has not been taking the nifedipine due to running out of this medication. Will stop the Nifedipine, change Lasix to twice a week and continue labetalol.  The patient understands the need to lose weight with diet and exercise. We have discussed specific strategies for this.  12 weeks follow up  Total time spent 12 minutes  There are no Patient Instructions on file for this visit.   Dispo:  No follow-ups on file.   Medication Adjustments/Labs and Tests Ordered: Current medicines are reviewed at length with the patient today.  Concerns regarding medicines are outlined above.  Tests Ordered: No orders of the defined types were placed in this encounter.  Medication Changes: No orders of the defined types were placed in this encounter.

## 2022-10-16 NOTE — Addendum Note (Signed)
Addended by: Orvan July on: 10/16/2022 03:11 PM   Modules accepted: Orders

## 2022-10-16 NOTE — Telephone Encounter (Signed)
Called patient in regards to 3:00pm virtual appointment, unable to reach. Left message asking her to call the office back.

## 2022-10-16 NOTE — Patient Instructions (Signed)
Medication Instructions:  Your physician has recommended you make the following change in your medication:  STOP: Nifedipine START: Lasix 40 mg on Tuesday and Thursday START: Potassium 20 mEq Tuesday and Thursday    Please take your blood pressure daily for 2 weeks and send in a MyChart message. Please include heart rates.   HOW TO TAKE YOUR BLOOD PRESSURE: Rest 5 minutes before taking your blood pressure. Don't smoke or drink caffeinated beverages for at least 30 minutes before. Take your blood pressure before (not after) you eat. Sit comfortably with your back supported and both feet on the floor (don't cross your legs). Elevate your arm to heart level on a table or a desk. Use the proper sized cuff. It should fit smoothly and snugly around your bare upper arm. There should be enough room to slip a fingertip under the cuff. The bottom edge of the cuff should be 1 inch above the crease of the elbow. Ideally, take 3 measurements at one sitting and record the average.  *If you need a refill on your cardiac medications before your next appointment, please call your pharmacy*   Lab Work: None  Testing/Procedures: None   Follow-Up: At [facility], you and your health needs are our priority.  As part of our continuing mission to provide you with exceptional heart care, we have created designated Provider Care Teams.  These Care Teams include your primary Cardiologist (physician) and Advanced Practice Providers (APPs -  Physician Assistants and Nurse Practitioners) who all work together to provide you with the care you need, when you need it.  We recommend signing up for the patient portal called "MyChart".  Sign up information is provided on this After Visit Summary.  MyChart is used to connect with patients for Virtual Visits (Telemedicine).  Patients are able to view lab/test results, encounter notes, upcoming appointments, etc.  Non-urgent messages can be sent to your provider  as well.   To learn more about what you can do with MyChart, go to [email or web address].    Your next appointment:   12 week(s)  Provider:   Berniece Salines, DO     Other Instructions

## 2022-11-01 ENCOUNTER — Encounter (INDEPENDENT_AMBULATORY_CARE_PROVIDER_SITE_OTHER): Payer: Self-pay | Admitting: Family Medicine

## 2022-11-01 ENCOUNTER — Ambulatory Visit (INDEPENDENT_AMBULATORY_CARE_PROVIDER_SITE_OTHER): Payer: BC Managed Care – PPO | Admitting: Family Medicine

## 2022-11-01 ENCOUNTER — Encounter: Payer: Self-pay | Admitting: Hematology and Oncology

## 2022-11-01 ENCOUNTER — Other Ambulatory Visit: Payer: Self-pay

## 2022-11-01 VITALS — BP 126/81 | HR 68 | Temp 98.3°F | Ht 71.0 in

## 2022-11-01 DIAGNOSIS — Z6841 Body Mass Index (BMI) 40.0 and over, adult: Secondary | ICD-10-CM | POA: Diagnosis not present

## 2022-11-01 DIAGNOSIS — K912 Postsurgical malabsorption, not elsewhere classified: Secondary | ICD-10-CM

## 2022-11-01 DIAGNOSIS — E559 Vitamin D deficiency, unspecified: Secondary | ICD-10-CM

## 2022-11-01 DIAGNOSIS — E538 Deficiency of other specified B group vitamins: Secondary | ICD-10-CM

## 2022-11-01 DIAGNOSIS — Z903 Acquired absence of stomach [part of]: Secondary | ICD-10-CM

## 2022-11-01 DIAGNOSIS — I10 Essential (primary) hypertension: Secondary | ICD-10-CM | POA: Diagnosis not present

## 2022-11-01 MED ORDER — VITAMIN D (ERGOCALCIFEROL) 1.25 MG (50000 UNIT) PO CAPS: 50000.0000 [IU] | ORAL_CAPSULE | ORAL | 0 refills | Status: DC

## 2022-11-01 MED ORDER — WEGOVY 0.25 MG/0.5ML ~~LOC~~ SOAJ
0.2500 mg | SUBCUTANEOUS | 0 refills | Status: DC
  Filled 2022-11-01 – 2022-11-09 (×2): qty 2, 28d supply, fill #0

## 2022-11-01 MED ORDER — VITAMIN B-12 1000 MCG PO TABS: 1000.0000 ug | ORAL_TABLET | Freq: Every day | ORAL | 0 refills | Status: DC

## 2022-11-09 ENCOUNTER — Encounter: Payer: Self-pay | Admitting: Hematology and Oncology

## 2022-11-09 ENCOUNTER — Other Ambulatory Visit: Payer: Self-pay

## 2022-11-10 ENCOUNTER — Inpatient Hospital Stay: Payer: BC Managed Care – PPO | Admitting: Adult Health

## 2022-11-10 ENCOUNTER — Telehealth: Payer: Self-pay

## 2022-11-10 NOTE — Telephone Encounter (Signed)
Called Pt regarding no show to NP appt. Pt stated she "woke up too late" and was unable to come. Pt agreeable to reschedule for 2/13.

## 2022-11-13 ENCOUNTER — Telehealth (INDEPENDENT_AMBULATORY_CARE_PROVIDER_SITE_OTHER): Payer: Self-pay | Admitting: *Deleted

## 2022-11-13 NOTE — Telephone Encounter (Signed)
Phone call to patient to check on her medication since we never received Prior authorization request. Prior authorization done via cover my meds for patients [employer]. Key: [id number]) Waiting on determination.

## 2022-11-14 ENCOUNTER — Inpatient Hospital Stay: Payer: BC Managed Care – PPO | Attending: Hematology and Oncology | Admitting: Adult Health

## 2022-11-14 ENCOUNTER — Other Ambulatory Visit: Payer: Self-pay

## 2022-11-14 ENCOUNTER — Inpatient Hospital Stay: Payer: BC Managed Care – PPO

## 2022-11-14 ENCOUNTER — Encounter: Payer: Self-pay | Admitting: Adult Health

## 2022-11-14 VITALS — BP 142/95 | HR 72 | Temp 97.4°F | Resp 18 | Ht 71.0 in | Wt 309.3 lb

## 2022-11-14 DIAGNOSIS — E538 Deficiency of other specified B group vitamins: Secondary | ICD-10-CM

## 2022-11-14 DIAGNOSIS — D509 Iron deficiency anemia, unspecified: Secondary | ICD-10-CM

## 2022-11-14 DIAGNOSIS — D508 Other iron deficiency anemias: Secondary | ICD-10-CM | POA: Diagnosis not present

## 2022-11-14 DIAGNOSIS — Z9884 Bariatric surgery status: Secondary | ICD-10-CM | POA: Diagnosis not present

## 2022-11-14 LAB — CBC WITH DIFFERENTIAL (CANCER CENTER ONLY)
Abs Immature Granulocytes: 0.01 10*3/uL (ref 0.00–0.07)
Basophils Absolute: 0 10*3/uL (ref 0.0–0.1)
Basophils Relative: 0 %
Eosinophils Absolute: 0.1 10*3/uL (ref 0.0–0.5)
Eosinophils Relative: 2 %
HCT: 37.7 % (ref 36.0–46.0)
Hemoglobin: 12.3 g/dL (ref 12.0–15.0)
Immature Granulocytes: 0 %
Lymphocytes Relative: 30 %
Lymphs Abs: 2 10*3/uL (ref 0.7–4.0)
MCH: 29.9 pg (ref 26.0–34.0)
MCHC: 32.6 g/dL (ref 30.0–36.0)
MCV: 91.5 fL (ref 80.0–100.0)
Monocytes Absolute: 0.5 10*3/uL (ref 0.1–1.0)
Monocytes Relative: 7 %
Neutro Abs: 4 10*3/uL (ref 1.7–7.7)
Neutrophils Relative %: 61 %
Platelet Count: 353 10*3/uL (ref 150–400)
RBC: 4.12 MIL/uL (ref 3.87–5.11)
RDW: 18.7 % — ABNORMAL HIGH (ref 11.5–15.5)
WBC Count: 6.6 10*3/uL (ref 4.0–10.5)
nRBC: 0 % (ref 0.0–0.2)

## 2022-11-14 LAB — CMP (CANCER CENTER ONLY)
ALT: 38 U/L (ref 0–44)
AST: 24 U/L (ref 15–41)
Albumin: 4 g/dL (ref 3.5–5.0)
Alkaline Phosphatase: 73 U/L (ref 38–126)
Anion gap: 4 — ABNORMAL LOW (ref 5–15)
BUN: 13 mg/dL (ref 6–20)
CO2: 24 mmol/L (ref 22–32)
Calcium: 8.7 mg/dL — ABNORMAL LOW (ref 8.9–10.3)
Chloride: 109 mmol/L (ref 98–111)
Creatinine: 0.7 mg/dL (ref 0.44–1.00)
GFR, Estimated: >60 mL/min (ref >60)
Glucose, Bld: 74 mg/dL (ref 70–99)
Potassium: 4.7 mmol/L (ref 3.5–5.1)
Sodium: 137 mmol/L (ref 135–145)
Total Bilirubin: 0.3 mg/dL (ref 0.3–1.2)
Total Protein: 7.5 g/dL (ref 6.5–8.1)

## 2022-11-14 LAB — VITAMIN B12: Vitamin B-12: 176 pg/mL — ABNORMAL LOW (ref 180–914)

## 2022-11-14 LAB — IRON AND IRON BINDING CAPACITY (CC-WL,HP ONLY)
Iron: 43 ug/dL (ref 28–170)
Saturation Ratios: 10 % — ABNORMAL LOW (ref 10.4–31.8)
TIBC: 444 ug/dL (ref 250–450)
UIBC: 401 ug/dL (ref 148–442)

## 2022-11-14 LAB — FERRITIN: Ferritin: 7 ng/mL — ABNORMAL LOW (ref 11–307)

## 2022-11-14 MED ORDER — CYANOCOBALAMIN 1000 MCG/ML IJ SOLN
1000.0000 ug | Freq: Once | INTRAMUSCULAR | Status: AC
  Administered 2022-11-14: 1000 ug via INTRAMUSCULAR
  Filled 2022-11-14: qty 1

## 2022-11-14 NOTE — Assessment & Plan Note (Signed)
Her iron deficiency in the setting of pregnancy and the inability to absorb oral iron from her previous gastric bypass surgery.  We are going to repeat CBC and iron studies today to follow-up on her levels.  We will call her with the results.  As she is getting B12 injections every 4 weeks we will repeat lab studies in approximately 24 weeks with follow-up afterwards.

## 2022-11-14 NOTE — Telephone Encounter (Addendum)
Prior authorization denied for patients [employer]. Per Her insurance company they do not cover any weight loss medications. Patient notified.

## 2022-11-14 NOTE — Progress Notes (Signed)
[facility] Follow up:    Carrie Shaw, [place] [address]   DIAGNOSIS: Iron deficiency anemia  SUMMARY OF HEMATOLOGIC HISTORY: S/p gastric bypass surgery in approximately 2018 and became pregnant in 2023 Oral iron 51m elemental iron BID begain in 03/2022 (ferritin 6, sat 4%, iron 19, TIBC 521, hemoglobin 8.5, MCV 76.3) 05/09/2022 hemoglobin 7.9, MCV 75.2; Venofer 2058mx 5  given beginning 05/15/2022  On 07/29/2022 her cbc was 8.3 and MCV improved to 81.4; ferritin on 10/31 6--was recommended to restart oral iron 08/10/2022 ferritin is 4--Venofer 2008m 5 began on 08/16/2022 Hemoglobin 09/19/2022 10.5, MCV 84.7  CURRENT THERAPY: Intermittent IV iron  INTERVAL HISTORY: Carrie Shaw 50o. female returns for follow-up of her iron deficiency anemia.  She has a history of iron deficiency anemia likely related to her gastric bypass surgery and difficulty with iron absorption due to this.  She tells me that she tolerated the oral iron moderately well however noticed it did not really make an impact on her labs like the IV iron did.  She is also been diagnosed with B12 deficiency and is not currently taking any supplementation at this time.  She was previously told to take oral B12 but healthy weight and wellness.  Her most recent hemoglobin had improved along with the MCV had normalized.  She has had 1 menstrual cycle since delivering her baby girl on December 18.  She is due for the next cycle soon.  She tells me that she is of course tired because she has an [id number]-old.  Patient Active Problem List   Diagnosis Date Noted   History of gestational hypertension 10/16/2022   Postpartum care following vaginal delivery 09/18/2022   Benign essential HTN, chronic, antepartum, third trimester 09/16/2022   Medication management 09/05/2022   [redacted] weeks gestation of pregnancy 09/05/2022   Gestational hypertension, third trimester 09/05/2022    Weight gain status post gastric bypass 07/18/2022   B12 deficiency 07/18/2022   Vitamin D deficiency 07/18/2022   Absolute anemia 07/18/2022   Other fatigue 07/04/2022   SOBOE (shortness of breath on exertion) 07/04/2022   [redacted] weeks gestation of pregnancy 07/04/2022   Weight gain following gastric bypass surgery 07/04/2022   Intestinal malabsorption following gastrectomy 07/04/2022   Depression 07/04/2022   IDA (iron deficiency anemia) 03/29/2022   Postpartum hypertension 08/29/2017   Intraoperative bladder injury 08/17/2017   Pelvic peritoneal adhesions, female 08/17/2017   Ectopic pregnancy without intrauterine pregnancy 08/16/2017   Morbid obesity ([facility]7/23/2014    has No Known Allergies.  MEDICAL HISTORY: Past Medical History:  Diagnosis Date   ADD (attention deficit disorder)    Anemia    Anxiety    B12 deficiency    Back pain    Bilateral swelling of feet    Depression    Gallbladder problem    HSV infection    Hypertension    Joint pain    Lower extremity edema    Obesity    Pregnancy induced hypertension    Sleep apnea    mild no mask   SOB (shortness of breath)    SVD (spontaneous vaginal delivery)    x 2   Termination of pregnancy (fetus)    x 2   UTI (urinary tract infection)    Vitamin B12 deficiency    Vitamin D deficiency     SURGICAL HISTORY: Past Surgical History:  Procedure Laterality Date   APPENDECTOMY  CYSTOSCOPY N/A 08/16/2017   Procedure: CYSTOSCOPY;  Surgeon: Sloan Leiter, MD;  Location: [facility];  Service: Gynecology;  Laterality: N/A;   GASTRIC ROUX-EN-Y N/A 01/01/2018   Procedure: LAPAROSCOPIC ROUX-EN-Y GASTRIC BYPASS WITH UPPER ENDOSCOPY;  Surgeon: Greer Pickerel, MD;  Location: [facility];  Service: General;  Laterality: N/A;   LAPAROSCOPY N/A 08/16/2017   Procedure: LAPAROSCOPY DIAGNOSTIC;  Surgeon: Sloan Leiter, MD;  Location: [facility];  Service: Gynecology;  Laterality: N/A;   LAPAROTOMY N/A 08/16/2017   Procedure: EXPLORATORY  LAPAROTOMY WITH REMOVAL OF LEFT ECTOPIC PREGNANCY, LEFT SALPINGECTOMY AND CYSTOTOMY OF BLADDER;  Surgeon: Sloan Leiter, MD;  Location: [facility];  Service: Gynecology;  Laterality: N/A;   LYSIS OF ADHESION N/A 08/16/2017   Procedure: LYSIS OF ADHESION;  Surgeon: Sloan Leiter, MD;  Location: [facility];  Service: Gynecology;  Laterality: N/A;   Tummy tuck     10/2019   UNILATERAL SALPINGECTOMY Left 08/16/2017   Procedure: UNILATERAL SALPINGECTOMY;  Surgeon: Sloan Leiter, MD;  Location: [facility];  Service: Gynecology;  Laterality: Left;    SOCIAL HISTORY: Social History   Socioeconomic History   Marital status: Married    Spouse name: Jaci Standard   Number of children: 2   Years of education: Not on file   Highest education level: Not on file  Occupational History   Occupation: Therapist, art Rep  Tobacco Use   Smoking status: Former    Packs/day: 0.25    Years: 10.00    Total pack years: 2.50    Types: Cigarettes    Quit date: 11/2021    Years since quitting: 0.9   Smokeless tobacco: Never  Vaping Use   Vaping Use: Never used  Substance and Sexual Activity   Alcohol use: Not Currently    Comment: rare   Drug use: No   Sexual activity: Yes    Partners: Male    Birth control/protection: None  Other Topics Concern   Not on file  Social History Narrative   She works at [employer]    Two children   Not married       She likes to shop and travel    Social Determinants of Radio broadcast assistant Strain: Not on file  Food Insecurity: No [place] (09/16/2022)   Hunger Vital Sign    Worried About Running Out of Food in the Last Year: Never true    Ran Out of Food in the Last Year: Never true  Transportation Needs: No Transportation Needs (09/16/2022)   PRAPARE - Hydrologist (Medical): No    Lack of Transportation (Non-Medical): No  Physical Activity: Not on file  Stress: Not on file  Social Connections: Not on file  Intimate Partner  Violence: Not At Risk (09/16/2022)   Humiliation, Afraid, Rape, and Kick questionnaire    Fear of Current or Ex-Partner: No    Emotionally Abused: No    Physically Abused: No    Sexually Abused: No    FAMILY HISTORY: Family History  Problem Relation Age of Onset   Depression Mother    Hypertension Mother    Anxiety disorder Mother    Sleep apnea Mother    Obesity Mother    Eating disorder Mother    Hypertension Father    Depression Father    Anxiety disorder Father    Sleep apnea Father    Alcoholism Father    Drug abuse Father    Obesity Father  Schizophrenia Sister    Bipolar disorder Sister     Review of Systems  Constitutional:  Positive for fatigue. Negative for appetite change, chills, fever and unexpected weight change.  HENT:   Negative for hearing loss, lump/mass and trouble swallowing.   Eyes:  Negative for eye problems and icterus.  Respiratory:  Negative for chest tightness, cough and shortness of breath.   Cardiovascular:  Negative for chest pain, leg swelling and palpitations.  Gastrointestinal:  Negative for abdominal distention, abdominal pain, constipation, diarrhea, nausea and vomiting.  Endocrine: Negative for hot flashes.  Genitourinary:  Negative for difficulty urinating.   Musculoskeletal:  Negative for arthralgias.  Skin:  Negative for itching and rash.  Neurological:  Negative for dizziness, extremity weakness, headaches and numbness.  Hematological:  Negative for adenopathy. Does not bruise/bleed easily.  Psychiatric/Behavioral:  Negative for depression. The patient is not nervous/anxious.       PHYSICAL EXAMINATION  ECOG PERFORMANCE STATUS: 1 - Symptomatic but completely ambulatory  Vitals:   11/14/22 1018  BP: (!) 142/95  Pulse: 72  Resp: 18  Temp: (!) 97.4 F (36.3 C)  SpO2: 100%    Physical Exam Constitutional:      General: She is not in acute distress.    Appearance: Normal appearance. She is not toxic-appearing.  HENT:      Head: Normocephalic and atraumatic.  Eyes:     General: No scleral icterus. Cardiovascular:     Rate and Rhythm: Normal rate and regular rhythm.     Pulses: Normal pulses.     Heart sounds: Normal heart sounds.  Pulmonary:     Effort: Pulmonary effort is normal.     Breath sounds: Normal breath sounds.  Abdominal:     General: Abdomen is flat. Bowel sounds are normal. There is no distension.     Palpations: Abdomen is soft.     Tenderness: There is no abdominal tenderness.  Musculoskeletal:        General: No swelling.     Cervical back: Neck supple.  Lymphadenopathy:     Cervical: No cervical adenopathy.  Skin:    General: Skin is warm and dry.     Findings: No rash.  Neurological:     General: No focal deficit present.     Mental Status: She is alert.  Psychiatric:        Mood and Affect: Mood normal.        Behavior: Behavior normal.     LABORATORY DATA:  CBC    Component Value Date/Time   WBC 14.2 (H) 09/19/2022 0321   RBC 3.79 (L) 09/19/2022 0321   HGB 10.5 (L) 09/19/2022 0321   HGB 9.8 (L) 09/13/2022 1027   HCT 32.1 (L) 09/19/2022 0321   HCT 32.0 (L) 09/13/2022 1027   PLT 280 09/19/2022 0321   PLT 281 09/13/2022 1027   MCV 84.7 09/19/2022 0321   MCV 86 09/13/2022 1027   MCH 27.7 09/19/2022 0321   MCHC 32.7 09/19/2022 0321   RDW 24.1 (H) 09/19/2022 0321   RDW 21.8 (H) 09/13/2022 1027   LYMPHSABS 1.3 09/18/2022 0215   LYMPHSABS 2.0 01/06/2020 1103   MONOABS 1.2 (H) 09/18/2022 0215   EOSABS 0.0 09/18/2022 0215   EOSABS 0.1 01/06/2020 1103   BASOSABS 0.0 09/18/2022 0215   BASOSABS 0.0 01/06/2020 1103     ASSESSMENT and THERAPY PLAN:   B12 deficiency We are going to check B12 levels today.  Because she has had bypass surgery she likely will  not absorb oral B12 and needs injections.  I placed orders for these if she would like to resume receiving these.  I added on an intrinsic factor and parietal antibody testing today as well.  IDA (iron  deficiency anemia) Her iron deficiency in the setting of pregnancy and the inability to absorb oral iron from her previous gastric bypass surgery.  We are going to repeat CBC and iron studies today to follow-up on her levels.  We will call her with the results.  As she is getting B12 injections every 4 weeks we will repeat lab studies in approximately 24 weeks with follow-up afterwards.  All questions were answered. The patient knows to call the clinic with any problems, questions or concerns. We can certainly see the patient much sooner if necessary.  Total encounter time:30 minutes*in face-to-face visit time, chart review, lab review, care coordination, order entry, and documentation of the encounter time.  Wilber Bihari, NP 11/14/22 11:07 AM Medical Oncology and Hematology [facility] [place], [place] [address] Tel. [phone]    Fax. [phone]  *Total Encounter Time as defined by the Centers for Medicare and Medicaid Services includes, in addition to the face-to-face time of a patient visit (documented in the note above) non-face-to-face time: obtaining and reviewing outside history, ordering and reviewing medications, tests or procedures, care coordination (communications with other health care professionals or caregivers) and documentation in the medical record.

## 2022-11-14 NOTE — Assessment & Plan Note (Signed)
We are going to check B12 levels today.  Because she has had bypass surgery she likely will not absorb oral B12 and needs injections.  I placed orders for these if she would like to resume receiving these.  I added on an intrinsic factor and parietal antibody testing today as well.

## 2022-11-15 LAB — ANTI-PARIETAL ANTIBODY: Parietal Cell Antibody-IgG: 43.7 Units — ABNORMAL HIGH (ref 0.0–20.0)

## 2022-11-16 LAB — INTRINSIC FACTOR ANTIBODIES: Intrinsic Factor: 1.1 AU/mL (ref 0.0–1.1)

## 2022-11-22 ENCOUNTER — Telehealth: Payer: Self-pay

## 2022-11-22 NOTE — Telephone Encounter (Signed)
-----   Message from Gardenia Phlegm, NP sent at 11/21/2022  4:13 PM EST ----- Pleaes call patient and let her know that PI and LC recommend IV iron.    If agreeable let me know and I will send this in.  ----- Message ----- From: Benay Pike, MD Sent: 11/21/2022   2:56 PM EST To: Gardenia Phlegm, NP  Yes should be ok. Is she still pregnant?  ----- Message ----- From: Gardenia Phlegm, NP Sent: 11/20/2022   8:35 AM EST To: Benay Pike, MD  Hey there,  Are you good with her receiving some venofer if agreeable?  She doesn't appear to absorb oral iron with her bypass.  (I thought I sent this to you-so correct me if I already have!) ----- Message ----- From: Interface, Lab In [place] Sent: 11/14/2022  11:10 AM EST To: Gardenia Phlegm, NP

## 2022-11-22 NOTE — Telephone Encounter (Signed)
Attempted to call patient per NP for below recommendations. LVM for call back.

## 2022-11-24 ENCOUNTER — Other Ambulatory Visit: Payer: Self-pay | Admitting: Adult Health

## 2022-11-24 ENCOUNTER — Encounter: Payer: Self-pay | Admitting: Hematology and Oncology

## 2022-11-24 ENCOUNTER — Telehealth: Payer: Self-pay

## 2022-11-24 NOTE — Telephone Encounter (Signed)
Spoke with patient per Wilber Bihari NP, Ferritin level is low and she recommends IV Iron. Patient is agreeable with receiving IV Iron. Patient is aware that the team will be following back up with her regarding the IV Iron. Message has been sent to Wilber Bihari NP to place orders.

## 2022-11-27 ENCOUNTER — Ambulatory Visit (INDEPENDENT_AMBULATORY_CARE_PROVIDER_SITE_OTHER): Payer: BC Managed Care – PPO | Admitting: Family Medicine

## 2022-11-27 ENCOUNTER — Encounter (INDEPENDENT_AMBULATORY_CARE_PROVIDER_SITE_OTHER): Payer: Self-pay | Admitting: Family Medicine

## 2022-11-27 ENCOUNTER — Telehealth (INDEPENDENT_AMBULATORY_CARE_PROVIDER_SITE_OTHER): Payer: Self-pay | Admitting: *Deleted

## 2022-11-27 VITALS — BP 149/88 | HR 65 | Temp 98.3°F | Ht 71.0 in | Wt 306.0 lb

## 2022-11-27 DIAGNOSIS — E559 Vitamin D deficiency, unspecified: Secondary | ICD-10-CM | POA: Diagnosis not present

## 2022-11-27 DIAGNOSIS — D508 Other iron deficiency anemias: Secondary | ICD-10-CM | POA: Diagnosis not present

## 2022-11-27 DIAGNOSIS — Z9884 Bariatric surgery status: Secondary | ICD-10-CM

## 2022-11-27 DIAGNOSIS — Z6841 Body Mass Index (BMI) 40.0 and over, adult: Secondary | ICD-10-CM

## 2022-11-27 MED ORDER — VITAMIN D (ERGOCALCIFEROL) 1.25 MG (50000 UNIT) PO CAPS: 50000.0000 [IU] | ORAL_CAPSULE | ORAL | 0 refills | Status: DC

## 2022-11-27 MED ORDER — ZEPBOUND 2.5 MG/0.5ML ~~LOC~~ SOAJ: 2.5000 mg | SUBCUTANEOUS | 0 refills | Status: DC

## 2022-11-27 NOTE — Assessment & Plan Note (Signed)
Improving with improved food choices, portion control, lean protein intake and starting an increase in physical activity.  Continue to consume 4-5 small meals per day, each with lean protein Plan to add in resistance training over the next month.  Continue a MVI daily, B12 injections, iron infusions for post op malabsorption related to gastric bypass.

## 2022-11-27 NOTE — Assessment & Plan Note (Signed)
Last vitamin D Lab Results  Component Value Date   VD25OH 13.2 (L) 07/04/2022   Taking RX vitamin D 50,000 IU weekly Energy level is improving Recheck vitamin D level next visit

## 2022-11-27 NOTE — Telephone Encounter (Signed)
Prior authorization for patients Zepbound is approved.  CaseId:[id number];Status:Approved;Review Type:Prior Auth;Coverage Start Date:10/28/2022;Coverage End Date:07/25/2023;

## 2022-11-27 NOTE — Assessment & Plan Note (Signed)
Related to gastric bypass surgery but also is 2 mos post partum with fairly heavy menses.  She is set up for IV iron infusions. Anticipate an improvement in energy levels.

## 2022-11-27 NOTE — Progress Notes (Signed)
Office: [phone]  /  Fax: [phone]  WEIGHT SUMMARY AND BIOMETRICS  Vitals Temp: 98.3 F (36.8 C) BP: (!) 149/88 Pulse Rate: 65 SpO2: 100 %   Anthropometric Measurements Height: '5\' 11"'$  (1.803 m) Weight: (!) 306 lb (138.8 kg) BMI (Calculated): 42.7 Weight at Last Visit: 309lb Weight Lost Since Last Visit: 3lb Starting Weight: 314lb Total Weight Loss (lbs): 8 lb (3.629 kg)   Body Composition  Body Fat %: 49.4 % Fat Mass (lbs): 151.6 lbs Muscle Mass (lbs): 147.4 lbs Total Body Water (lbs): 111.6 lbs Visceral Fat Rating : 14   Other Clinical Data Fasting: no Labs: no Today's Visit #: 5 Starting Date: 07/04/22    HPI  Chief Complaint: OBESITY  Carrie Shaw is here to discuss her progress with her obesity treatment plan. She is on the the Category 3 Plan and states she is following her eating plan approximately 80 % of the time. She states she is exercising 30 minutes 3 times per week.   Interval History:  Since last office visit she is down 3 lb  This gives her a net weight loss of 8 lb in 4 mos She has a busy schedule with her 3 kids.  Her 2 mos old baby is sleeping thru the night Pre baby weight was 280 lb She is no longer breastfeeding.  Not snacking much.  She is eating her meals- each meal with and more salads, little fruit.  Trying to get more water in.  Has some menstrual sugar cravings.  Keeping trigger foods out of the house.  Periods have regulated but have been heavier.  Pharmacotherapy: none  PHYSICAL EXAM:  Blood pressure (!) 149/88, pulse 65, temperature 98.3 F (36.8 C), height '5\' 11"'$  (1.803 m), weight (!) 306 lb (138.8 kg), SpO2 100 %, unknown if currently breastfeeding. Body mass index is 42.68 kg/m.  General: She is overweight, cooperative, alert, well developed, and in no acute distress. PSYCH: Has normal mood, affect and thought process.   HEENT: EOMI, sclerae are anicteric. Lungs: Normal breathing effort, no conversational  dyspnea. Extremities: No edema.  Neurologic: No gross sensory or motor deficits. No tremors or fasciculations noted.    DIAGNOSTIC DATA REVIEWED:  BMET    Component Value Date/Time   NA 137 11/14/2022 1054   NA 136 09/13/2022 1027   K 4.7 11/14/2022 1054   CL 109 11/14/2022 1054   CO2 24 11/14/2022 1054   GLUCOSE 74 11/14/2022 1054   BUN 13 11/14/2022 1054   BUN 7 09/13/2022 1027   CREATININE 0.70 11/14/2022 1054   CREATININE 0.72 05/17/2016 1155   CALCIUM 8.7 (L) 11/14/2022 1054   GFRNONAA >60 11/14/2022 1054   GFRNONAA >89 12/25/2014 1227   GFRAA 138 01/06/2020 1103   GFRAA >89 12/25/2014 1227   Lab Results  Component Value Date   HGBA1C 5.0 07/04/2022   HGBA1C 4.9 01/06/2020   Lab Results  Component Value Date   INSULIN 8.1 07/04/2022   INSULIN 7.8 01/06/2020   Lab Results  Component Value Date   TSH 1.490 07/04/2022   CBC    Component Value Date/Time   WBC 6.6 11/14/2022 1054   WBC 14.2 (H) 09/19/2022 0321   RBC 4.12 11/14/2022 1054   HGB 12.3 11/14/2022 1054   HGB 9.8 (L) 09/13/2022 1027   HCT 37.7 11/14/2022 1054   HCT 32.0 (L) 09/13/2022 1027   PLT 353 11/14/2022 1054   PLT 281 09/13/2022 1027   MCV 91.5 11/14/2022 1054   MCV  86 09/13/2022 1027   MCH 29.9 11/14/2022 1054   MCHC 32.6 11/14/2022 1054   RDW 18.7 (H) 11/14/2022 1054   RDW 21.8 (H) 09/13/2022 1027   Iron Studies    Component Value Date/Time   IRON 43 11/14/2022 1054   TIBC 444 11/14/2022 1054   FERRITIN 7 (L) 11/14/2022 1055   IRONPCTSAT 10 (L) 11/14/2022 1054   Lipid Panel     Component Value Date/Time   CHOL 195 07/04/2022 0945   TRIG 96 07/04/2022 0945   HDL 59 07/04/2022 0945   CHOLHDL 3.4 12/25/2014 1227   VLDL 11 12/25/2014 1227   LDLCALC 119 (H) 07/04/2022 0945   Hepatic Function Panel     Component Value Date/Time   PROT 7.5 11/14/2022 1054   PROT 5.9 (L) 09/13/2022 1027   ALBUMIN 4.0 11/14/2022 1054   ALBUMIN 3.1 (L) 09/13/2022 1027   AST 24 11/14/2022 1054    ALT 38 11/14/2022 1054   ALKPHOS 73 11/14/2022 1054   BILITOT 0.3 11/14/2022 1054   BILIDIR 0.1 02/12/2015 0927   IBILI 0.4 02/12/2015 0927      Component Value Date/Time   TSH 1.490 07/04/2022 0945   Nutritional Lab Results  Component Value Date   VD25OH 13.2 (L) 07/04/2022   VD25OH 17.8 (L) 01/06/2020     ASSESSMENT AND PLAN  TREATMENT PLAN FOR OBESITY:  Recommended Dietary Goals  Tykeisha is currently in the action stage of change. As such, her goal is to continue weight management plan. She has agreed to the Category 3 Plan.  Behavioral Intervention  We discussed the following Behavioral Modification Strategies today: increasing lean protein intake, increasing vegetables, increasing water intake, work on meal planning and easy cooking plans, and work on managing stress, creating time for self-care and relaxation measures.  Additional resources provided today: NA  Recommended Physical Activity Goals  Sallyann has been advised to work up to 150 minutes of moderate intensity aerobic activity a week and strengthening exercises 2-3 times per week for cardiovascular health, weight loss maintenance and preservation of muscle mass.   She has agreed to increase physical activity in their day and reduce sedentary time (increase NEAT).    Pharmacotherapy We discussed various medication options to help Rasha with her weight loss efforts and we both agreed to Zepbound.  ASSOCIATED CONDITIONS ADDRESSED TODAY  Weight gain following gastric bypass surgery Assessment & Plan: Improving with improved food choices, portion control, lean protein intake and starting an increase in physical activity.  Continue to consume 4-5 small meals per day, each with lean protein Plan to add in resistance training over the next month.  Continue a MVI daily, B12 injections, iron infusions for post op malabsorption related to gastric bypass.   Vitamin D deficiency Assessment &  Plan: Last vitamin D Lab Results  Component Value Date   VD25OH 13.2 (L) 07/04/2022   Taking RX vitamin D 50,000 IU weekly Energy level is improving Recheck vitamin D level next visit   Orders: -     Vitamin D (Ergocalciferol); Take 1 capsule (50,000 Units total) by mouth every 7 (seven) days.  Dispense: 5 capsule; Refill: 0  Morbid obesity ([place]) -     Zepbound; Inject 2.5 mg into the skin once a week.  Dispense: 2 mL; Refill: 0  BMI 40.0-44.9, adult (HCC)  Other iron deficiency anemia  Iron deficiency anemia secondary to inadequate dietary iron intake Assessment & Plan: Related to gastric bypass surgery but also is 2 mos post partum  with fairly heavy menses.  She is set up for IV iron infusions. Anticipate an improvement in energy levels.       No follow-ups on file.Marland Kitchen She was informed of the importance of frequent follow up visits to maximize her success with intensive lifestyle modifications for her multiple health conditions.   ATTESTASTION STATEMENTS:  Reviewed by clinician on day of visit: allergies, medications, problem list, medical history, surgical history, family history, social history, and previous encounter notes.   I have personally spent 30 minutes total time today in preparation, patient care, nutritional counseling and documentation for this visit, including the following: review of clinical lab tests; review of medical tests/procedures/services.      Dell Ponto, DO

## 2022-11-28 NOTE — Progress Notes (Signed)
Chief Complaint:   OBESITY Carrie Shaw is here to discuss her progress with her obesity treatment plan along with follow-up of her obesity related diagnoses. Carrie Shaw is on the Category 4 Plan and keeping a food journal and adhering to recommended goals of 2000 calories and 90 protein and states she is following her eating plan approximately 0% of the time. Carrie Shaw states she is not exercising.  Today's visit was #: 4 Starting weight: 314 lbs Starting date: 07/05/2023 Today's weight: 309 lbs Today's date: 11/01/2022 Total lbs lost to date: 5 lbs Total lbs lost since last in-office visit: 19 lbs  Interim History: Patient had a baby girl, 09/16/2022.  She is no longer breast-feeding and weight has been stuck at 295 LBS for the last 2 weeks.  Patient is feeling hungry, eating breakfast and 2 protein shakes and then dinner.  Patient is having a lot of head hunger.  She is not on birth control has never used AOM's.  Subjective:   1. Intestinal malabsorption following gastrectomy Patient taking Vitamin B12 1,000 mcg daily.  Patient is taking prescription Vitamin D 50,000 IU weekly.  Nadir weight 265 lbs.  Patient is taking a PNV daily and iron 2 times per day.  Has hematology follow up on 11/10/2022.    2. Essential hypertension Patient is taking Lasix 40 mg 2 days/week.  She is on labetalol 200 mg every 8 hours, taking once a day.  Blood pressure is well-controlled.  Assessment/Plan:   1. Intestinal malabsorption following gastrectomy Recheck labs in 4 weeks. Refill- vitamin D 50,000 IU weekly #5 no refills. Refill-B12 1000 mcg daily number #30 tablets no refills  2. Essential hypertension Avoid use of phentermine or Qsymia due to hypertension.  Look for further blood pressure improvements with weight loss.  3. Morbid obesity ([place]) 1.  Continue to consume 3 meals per day each with 20+ grams of protein. 2.  Adding to higher protein snacks daily.  Carrie Shaw is currently in the  action stage of change. As such, her goal is to continue with weight loss efforts. She has agreed to the Category 4 Plan +130 g of protein daily.   Exercise goals:  Add in gym workout 3 days/week  Behavioral modification strategies: increasing lean protein intake, increasing vegetables, increasing water intake, decreasing eating out, no skipping meals, meal planning and cooking strategies, keeping healthy foods in the home, avoiding temptations, and planning for success.  Carrie Shaw has agreed to follow-up with our clinic in 4 weeks. She was informed of the importance of frequent follow-up visits to maximize her success with intensive lifestyle modifications for her multiple health conditions.   Objective:   Blood pressure 126/81, pulse 68, temperature 98.3 F (36.8 C), height '5\' 11"'$  (1.803 m), SpO2 98 %, unknown if currently breastfeeding. Body mass index is 41.14 kg/m.  General: Cooperative, alert, well developed, in no acute distress. HEENT: Conjunctivae and lids unremarkable. Cardiovascular: Regular rhythm.  Lungs: Normal work of breathing. Neurologic: No focal deficits.   Lab Results  Component Value Date   CREATININE 0.70 11/14/2022   BUN 13 11/14/2022   NA 137 11/14/2022   K 4.7 11/14/2022   CL 109 11/14/2022   CO2 24 11/14/2022   Lab Results  Component Value Date   ALT 38 11/14/2022   AST 24 11/14/2022   ALKPHOS 73 11/14/2022   BILITOT 0.3 11/14/2022   Lab Results  Component Value Date   HGBA1C 5.0 07/04/2022   HGBA1C 4.9 01/06/2020   Lab  Results  Component Value Date   INSULIN 8.1 07/04/2022   INSULIN 7.8 01/06/2020   Lab Results  Component Value Date   TSH 1.490 07/04/2022   Lab Results  Component Value Date   CHOL 195 07/04/2022   HDL 59 07/04/2022   LDLCALC 119 (H) 07/04/2022   TRIG 96 07/04/2022   CHOLHDL 3.4 12/25/2014   Lab Results  Component Value Date   VD25OH 13.2 (L) 07/04/2022   VD25OH 17.8 (L) 01/06/2020   Lab Results  Component  Value Date   WBC 6.6 11/14/2022   HGB 12.3 11/14/2022   HCT 37.7 11/14/2022   MCV 91.5 11/14/2022   PLT 353 11/14/2022   Lab Results  Component Value Date   IRON 43 11/14/2022   TIBC 444 11/14/2022   FERRITIN 7 (L) 11/14/2022   Attestation Statements:   Reviewed by clinician on day of visit: allergies, medications, problem list, medical history, surgical history, family history, social history, and previous encounter notes.  I have personally spent 30 minutes total time today in preparation, patient care, nutritional counseling and documentation for this visit, including the following: review of clinical lab tests; review of medical tests/procedures/services.    I, Davy Pique, am acting as Location manager for Loyal Gambler, DO.  I have reviewed the above documentation for accuracy and completeness, and I agree with the above. Dell Ponto, DO

## 2022-12-01 ENCOUNTER — Inpatient Hospital Stay: Payer: BC Managed Care – PPO | Attending: Hematology and Oncology

## 2022-12-01 VITALS — BP 156/69 | HR 65 | Temp 97.5°F | Resp 18

## 2022-12-01 DIAGNOSIS — Z9884 Bariatric surgery status: Secondary | ICD-10-CM | POA: Insufficient documentation

## 2022-12-01 DIAGNOSIS — D509 Iron deficiency anemia, unspecified: Secondary | ICD-10-CM

## 2022-12-01 DIAGNOSIS — D508 Other iron deficiency anemias: Secondary | ICD-10-CM | POA: Insufficient documentation

## 2022-12-01 DIAGNOSIS — E538 Deficiency of other specified B group vitamins: Secondary | ICD-10-CM | POA: Insufficient documentation

## 2022-12-01 MED ORDER — SODIUM CHLORIDE 0.9 % IV SOLN: Freq: Once | INTRAVENOUS | Status: AC

## 2022-12-01 MED ORDER — SODIUM CHLORIDE 0.9 % IV SOLN
200.0000 mg | Freq: Once | INTRAVENOUS | Status: AC
  Administered 2022-12-01: 200 mg via INTRAVENOUS
  Filled 2022-12-01: qty 10

## 2022-12-01 NOTE — Progress Notes (Signed)
Venofer '200mg'$  Lot:[id number], Exp:08/31/2024  Acquanetta Belling, RPH, BCPS, BCOP 12/01/2022 3:10 PM

## 2022-12-01 NOTE — Patient Instructions (Signed)

## 2022-12-04 ENCOUNTER — Inpatient Hospital Stay: Payer: BC Managed Care – PPO

## 2022-12-04 VITALS — BP 158/89 | HR 69 | Temp 97.7°F | Resp 18

## 2022-12-04 DIAGNOSIS — D509 Iron deficiency anemia, unspecified: Secondary | ICD-10-CM

## 2022-12-04 DIAGNOSIS — D508 Other iron deficiency anemias: Secondary | ICD-10-CM | POA: Diagnosis not present

## 2022-12-04 DIAGNOSIS — E538 Deficiency of other specified B group vitamins: Secondary | ICD-10-CM | POA: Diagnosis not present

## 2022-12-04 DIAGNOSIS — Z9884 Bariatric surgery status: Secondary | ICD-10-CM | POA: Diagnosis not present

## 2022-12-04 MED ORDER — SODIUM CHLORIDE 0.9 % IV SOLN: Freq: Once | INTRAVENOUS | Status: AC

## 2022-12-04 MED ORDER — SODIUM CHLORIDE 0.9 % IV SOLN
200.0000 mg | Freq: Once | INTRAVENOUS | Status: AC
  Administered 2022-12-04: 200 mg via INTRAVENOUS
  Filled 2022-12-04: qty 200

## 2022-12-04 NOTE — Patient Instructions (Signed)

## 2022-12-04 NOTE — Progress Notes (Signed)
Patient did well with her iron today- has had multiple doses with no issues. Patient declined the 30 minute observation period. VSS- BP (!) 158/89 (BP Location: Left Arm, Patient Position: Sitting)   Pulse 69   Temp 97.7 F (36.5 C) (Oral)   Resp 18   SpO2 100%   Ambulatory to the lobby.

## 2022-12-06 ENCOUNTER — Inpatient Hospital Stay: Payer: BC Managed Care – PPO

## 2022-12-06 VITALS — BP 141/90 | HR 73 | Temp 97.7°F | Resp 16

## 2022-12-06 DIAGNOSIS — D508 Other iron deficiency anemias: Secondary | ICD-10-CM | POA: Diagnosis not present

## 2022-12-06 DIAGNOSIS — D509 Iron deficiency anemia, unspecified: Secondary | ICD-10-CM

## 2022-12-06 DIAGNOSIS — E538 Deficiency of other specified B group vitamins: Secondary | ICD-10-CM | POA: Diagnosis not present

## 2022-12-06 DIAGNOSIS — Z9884 Bariatric surgery status: Secondary | ICD-10-CM | POA: Diagnosis not present

## 2022-12-06 MED ORDER — SODIUM CHLORIDE 0.9 % IV SOLN
200.0000 mg | Freq: Once | INTRAVENOUS | Status: AC
  Administered 2022-12-06: 200 mg via INTRAVENOUS
  Filled 2022-12-06: qty 200

## 2022-12-06 MED ORDER — SODIUM CHLORIDE 0.9 % IV SOLN: Freq: Once | INTRAVENOUS | Status: AC

## 2022-12-06 NOTE — Progress Notes (Signed)
Patient declined to stay for 30 minute post infusion observation. Patient tolerated infusion well. VSS at discharge. Ambulated independently to lobby.

## 2022-12-06 NOTE — Patient Instructions (Signed)

## 2022-12-08 ENCOUNTER — Inpatient Hospital Stay: Payer: BC Managed Care – PPO

## 2022-12-08 VITALS — BP 150/86 | HR 76 | Temp 97.9°F | Resp 17

## 2022-12-08 DIAGNOSIS — Z9884 Bariatric surgery status: Secondary | ICD-10-CM | POA: Diagnosis not present

## 2022-12-08 DIAGNOSIS — E538 Deficiency of other specified B group vitamins: Secondary | ICD-10-CM | POA: Diagnosis not present

## 2022-12-08 DIAGNOSIS — D509 Iron deficiency anemia, unspecified: Secondary | ICD-10-CM

## 2022-12-08 DIAGNOSIS — D508 Other iron deficiency anemias: Secondary | ICD-10-CM | POA: Diagnosis not present

## 2022-12-08 MED ORDER — CYANOCOBALAMIN 1000 MCG/ML IJ SOLN
1000.0000 ug | Freq: Once | INTRAMUSCULAR | Status: AC
  Administered 2022-12-08: 1000 ug via INTRAMUSCULAR
  Filled 2022-12-08: qty 1

## 2022-12-08 MED ORDER — SODIUM CHLORIDE 0.9 % IV SOLN
200.0000 mg | Freq: Once | INTRAVENOUS | Status: AC
  Administered 2022-12-08: 200 mg via INTRAVENOUS
  Filled 2022-12-08: qty 200

## 2022-12-08 MED ORDER — SODIUM CHLORIDE 0.9 % IV SOLN: Freq: Once | INTRAVENOUS | Status: AC

## 2022-12-08 NOTE — Progress Notes (Signed)
Patient declined to stay for 30 minute post observation. VSS and ambulated independently upon discharge.

## 2022-12-12 ENCOUNTER — Inpatient Hospital Stay: Payer: BC Managed Care – PPO

## 2022-12-26 ENCOUNTER — Ambulatory Visit (INDEPENDENT_AMBULATORY_CARE_PROVIDER_SITE_OTHER): Payer: BC Managed Care – PPO | Admitting: Family Medicine

## 2022-12-26 ENCOUNTER — Encounter: Payer: Self-pay | Admitting: Hematology and Oncology

## 2022-12-26 ENCOUNTER — Encounter (INDEPENDENT_AMBULATORY_CARE_PROVIDER_SITE_OTHER): Payer: Self-pay | Admitting: Family Medicine

## 2022-12-26 VITALS — BP 136/83 | HR 78 | Temp 97.9°F | Ht 71.0 in | Wt 301.0 lb

## 2022-12-26 DIAGNOSIS — R635 Abnormal weight gain: Secondary | ICD-10-CM

## 2022-12-26 DIAGNOSIS — E559 Vitamin D deficiency, unspecified: Secondary | ICD-10-CM | POA: Diagnosis not present

## 2022-12-26 DIAGNOSIS — Z6841 Body Mass Index (BMI) 40.0 and over, adult: Secondary | ICD-10-CM

## 2022-12-26 DIAGNOSIS — D508 Other iron deficiency anemias: Secondary | ICD-10-CM

## 2022-12-26 DIAGNOSIS — Z9884 Bariatric surgery status: Secondary | ICD-10-CM

## 2022-12-26 MED ORDER — ZEPBOUND 5 MG/0.5ML ~~LOC~~ SOAJ: 5.0000 mg | SUBCUTANEOUS | 0 refills | Status: DC

## 2022-12-26 MED ORDER — VITAMIN D (ERGOCALCIFEROL) 1.25 MG (50000 UNIT) PO CAPS: 50000.0000 [IU] | ORAL_CAPSULE | ORAL | 0 refills | Status: DC

## 2022-12-26 NOTE — Assessment & Plan Note (Signed)
Improving She feels back on track with eating on a schedule and prescribed meal plan.  She is motivated to begin doing more consistent exercise.  We reviewed her bioimpedance results.  Anticipate further weight reduction with increased dose of tirzepatide.  Recommend use of the my fitness pal app to log daily intake aiming for 1500 kcal/day which should include about 110 g of protein intake.

## 2022-12-26 NOTE — Assessment & Plan Note (Signed)
Last vitamin D Lab Results  Component Value Date   VD25OH 13.2 (L) 07/04/2022   She is doing well on prescription vitamin D 50,000 IU once weekly.  Her energy level is starting to improve.  Recheck vitamin D level today with a target goal 50-70.

## 2022-12-26 NOTE — Assessment & Plan Note (Signed)
She is status post for IV iron infusions with hematology.  She has iron deficiency anemia status post gastric bypass surgery and from recent pregnancy.  Her energy level is starting to improve.  She is scheduled for recheck B12 and iron levels with hematology in the next month.

## 2022-12-26 NOTE — Progress Notes (Signed)
Office: [phone]  /  Fax: [place]  Starting Date: 07/04/22  Starting Weight: 314lb   Weight Lost Since Last Visit: 5lb   Vitals Temp: 97.9 F (36.6 C) BP: 136/83 Pulse Rate: 78 SpO2: 100 %   Body Composition  Body Fat %: 47.1 % Fat Mass (lbs): 142 lbs Muscle Mass (lbs): 151.45 lbs Total Body Water (lbs): 104.8 lbs Visceral Fat Rating : 13   HPI  Chief Complaint: OBESITY  Carrie Shaw is here to discuss her progress with her obesity treatment plan. She is on the the Category 3 Plan and states she is following her eating plan approximately 85 % of the time. She states she is exercising 30 minutes 4 times per week.   Interval History:  Since last office visit she is down 5 lb  She is not on birth control and is abstinent She is 3 mos post partum-- she is sleeping thru the night.   She is s/p 4 IV iron infusions - energy level is improving Prenatal vitamin --> bariatric multivitamin Taking RX vitamin D weekly She has started to walk more and has a Forensic scientist    Pharmacotherapy: Zepbound 2.5 mg  PHYSICAL EXAM:  Blood pressure 136/83, pulse 78, temperature 97.9 F (36.6 C), height 5\' 11"  (1.803 m), weight (!) 301 lb (136.5 kg), SpO2 100 %, unknown if currently breastfeeding. Body mass index is 41.98 kg/m.  General: She is overweight, cooperative, alert, well developed, and in no acute distress. PSYCH: Has normal mood, affect and thought process.   Lungs: Normal breathing effort, no conversational dyspnea.   ASSESSMENT AND PLAN  TREATMENT PLAN FOR OBESITY:  Recommended Dietary Goals  [name] is currently in the action stage of change. As such, her goal is to continue weight management plan. She has agreed to the Category 3 Plan.  Behavioral Intervention  We discussed the following Behavioral Modification Strategies today: increasing lean protein intake, increasing vegetables, increasing water intake, work on  meal planning and easy cooking plans, work on Interior and spatial designer calories using tracking App, planning for success, and keeping healthy foods at home.  Additional resources provided today: NA  Recommended Physical Activity Goals  [name] has been advised to work up to 150 minutes of moderate intensity aerobic activity a week and strengthening exercises 2-3 times per week for cardiovascular health, weight loss maintenance and preservation of muscle mass.   She has agreed to Work on scheduling and tracking physical activity.   Pharmacotherapy changes for the treatment of obesity: Tirzepatide increased to 5 mg once weekly injection  ASSOCIATED CONDITIONS ADDRESSED TODAY  Vitamin D deficiency Assessment & Plan: Last vitamin D Lab Results  Component Value Date   VD25OH 13.2 (L) 07/04/2022   She is doing well on prescription vitamin D 50,000 IU once weekly.  Her energy level is starting to improve.  Recheck vitamin D level today with a target goal 50-70.  Orders: -     Vitamin D (Ergocalciferol); Take 1 capsule (50,000 Units total) by mouth every 7 (seven) days.  Dispense: 5 capsule; Refill: 0 -     VITAMIN D 25 Hydroxy (Vit-D Deficiency, Fractures)  Morbid obesity ([place]) -     Zepbound; Inject 5 mg into the skin once a week.  Dispense: 2 mL; Refill: 0  BMI 40.0-44.9, adult ([facility])  Weight gain following gastric bypass surgery Assessment & Plan: Improving She feels back on track with eating on a schedule and prescribed meal plan.  She  is motivated to begin doing more consistent exercise.  We reviewed her bioimpedance results.  Anticipate further weight reduction with increased dose of tirzepatide.  Recommend use of the my fitness pal app to log daily intake aiming for 1500 kcal/day which should include about 110 g of protein intake.   Other iron deficiency anemia  Iron deficiency anemia secondary to inadequate dietary iron intake Assessment & Plan: She is status post for IV  iron infusions with hematology.  She has iron deficiency anemia status post gastric bypass surgery and from recent pregnancy.  Her energy level is starting to improve.  She is scheduled for recheck B12 and iron levels with hematology in the next month.       She was informed of the importance of frequent follow up visits to maximize her success with intensive lifestyle modifications for her multiple health conditions.   ATTESTASTION STATEMENTS:  Reviewed by clinician on day of visit: allergies, medications, problem list, medical history, surgical history, family history, social history, and previous encounter notes pertinent to obesity diagnosis.   I have personally spent 30 minutes total time today in preparation, patient care, nutritional counseling and documentation for this visit, including the following: review of clinical lab tests; review of medical tests/procedures/services.      Dell Ponto, DO DABFM, DABOM [facility] [address] [place], [place] [address] [phone]

## 2023-01-08 ENCOUNTER — Other Ambulatory Visit: Payer: Self-pay

## 2023-01-08 ENCOUNTER — Inpatient Hospital Stay: Payer: BC Managed Care – PPO | Attending: Hematology and Oncology

## 2023-01-08 DIAGNOSIS — E538 Deficiency of other specified B group vitamins: Secondary | ICD-10-CM | POA: Insufficient documentation

## 2023-01-08 DIAGNOSIS — D509 Iron deficiency anemia, unspecified: Secondary | ICD-10-CM

## 2023-01-08 MED ORDER — CYANOCOBALAMIN 1000 MCG/ML IJ SOLN
1000.0000 ug | Freq: Once | INTRAMUSCULAR | Status: AC
  Administered 2023-01-08: 1000 ug via INTRAMUSCULAR
  Filled 2023-01-08: qty 1

## 2023-01-09 ENCOUNTER — Inpatient Hospital Stay: Payer: BC Managed Care – PPO

## 2023-01-25 ENCOUNTER — Encounter (INDEPENDENT_AMBULATORY_CARE_PROVIDER_SITE_OTHER): Payer: Self-pay | Admitting: Family Medicine

## 2023-01-25 ENCOUNTER — Encounter: Payer: Self-pay | Admitting: Hematology and Oncology

## 2023-01-25 ENCOUNTER — Ambulatory Visit (INDEPENDENT_AMBULATORY_CARE_PROVIDER_SITE_OTHER): Payer: BC Managed Care – PPO | Admitting: Family Medicine

## 2023-01-25 VITALS — BP 133/84 | HR 68 | Temp 97.7°F | Ht 71.0 in | Wt 298.0 lb

## 2023-01-25 DIAGNOSIS — K912 Postsurgical malabsorption, not elsewhere classified: Secondary | ICD-10-CM

## 2023-01-25 DIAGNOSIS — Z903 Acquired absence of stomach [part of]: Secondary | ICD-10-CM

## 2023-01-25 DIAGNOSIS — Z6841 Body Mass Index (BMI) 40.0 and over, adult: Secondary | ICD-10-CM

## 2023-01-25 DIAGNOSIS — E559 Vitamin D deficiency, unspecified: Secondary | ICD-10-CM

## 2023-01-25 MED ORDER — ZEPBOUND 7.5 MG/0.5ML ~~LOC~~ SOAJ
7.5000 mg | SUBCUTANEOUS | 1 refills | Status: DC
Start: 2023-01-25 — End: 2023-02-28

## 2023-01-25 MED ORDER — VITAMIN D (ERGOCALCIFEROL) 1.25 MG (50000 UNIT) PO CAPS: 50000.0000 [IU] | ORAL_CAPSULE | ORAL | 0 refills | Status: DC

## 2023-01-25 NOTE — Assessment & Plan Note (Signed)
She has been on treatment for Vitamin D, Vitamin B12 and iron deficiency s/p RYGB surgery. She has upcoming hematology follow up in early May to recheck iron and Vitamin B12 levels She remains on a Prenatal vitamin daily and RX vitamin D 50,000 IU weekly Energy levels have improved  Continue current supplements Keep upcoming visit with hematology

## 2023-01-25 NOTE — Assessment & Plan Note (Signed)
Last vitamin D Lab Results  Component Value Date   VD25OH 13.2 (L) 07/04/2022   She is taking RX vitamin D weekly and is due for a level today.  Denies adverse Ses.  Goal 50-70.

## 2023-01-25 NOTE — Progress Notes (Signed)
Office: [phone]  /  Fax: [phone]  WEIGHT SUMMARY AND BIOMETRICS  Starting Date: 07/04/22  Starting Weight: 314lb   Weight Lost Since Last Visit: 3lb   Vitals Temp: 97.7 F (36.5 C) BP: 133/84 Pulse Rate: 68 SpO2: 99 %   Body Composition  Body Fat %: 47.6 % Fat Mass (lbs): 142.2 lbs Muscle Mass (lbs): 148.6 lbs Total Body Water (lbs): 108.2 lbs Visceral Fat Rating : 13    HPI  Chief Complaint: OBESITY  Carrie Shaw is here to discuss her progress with her obesity treatment plan. She is on the the Category 4 Plan and states she is following her eating plan approximately 75 % of the time. She states she is exercising 30-45 minutes 3 times per week.   Interval History:  Since last office visit she is down 3 lb She has lost 3 lb of muscle mass She is motivated  to continue working on meal planning and workouts Lack of time has been her biggest barrier due to work and kids She has her bday coming up She is getting in more fruits and veggies She is keeping junk food out of the house She is going to [employer] 3 x a week Doing well on Zepbound 5 mg weekly without side effects and has added in protein shakes and protein soups Has volume restriction s/p gastric bypass   Pharmacotherapy: Zepbound 5 mg weekly  PHYSICAL EXAM:  Blood pressure 133/84, pulse 68, temperature 97.7 F (36.5 C), height  (1.803 m), weight 298 lb (135.2 kg), SpO2 99 %, unknown if currently breastfeeding. Body mass index is 41.56 kg/m.  General: She is overweight, cooperative, alert, well developed, and in no acute distress. PSYCH: Has normal mood, affect and thought process.   Lungs: Normal breathing effort, no conversational dyspnea.   ASSESSMENT AND PLAN  TREATMENT PLAN FOR OBESITY:  Recommended Dietary Goals  Carrie Shaw is currently in the action stage of change. As such, her goal is to continue weight management plan. She has agreed to the Category 4  Plan.  Behavioral Intervention  We discussed the following Behavioral Modification Strategies today: increasing lean protein intake, decreasing simple carbohydrates , increasing vegetables, increasing lower glycemic fruits, increasing water intake, continue to practice mindfulness when eating, and planning for success.  Additional resources provided today: NA  Recommended Physical Activity Goals  Carrie Shaw has been advised to work up to 150 minutes of moderate intensity aerobic activity a week and strengthening exercises 2-3 times per week for cardiovascular health, weight loss maintenance and preservation of muscle mass.   She has agreed to Work on scheduling and tracking physical activity.   Pharmacotherapy changes for the treatment of obesity: increase Zepbound to 7.5 mg weekly  ASSOCIATED CONDITIONS ADDRESSED TODAY  Intestinal malabsorption following gastrectomy Assessment & Plan: She has been on treatment for Vitamin D, Vitamin B12 and iron deficiency s/p RYGB surgery. She has upcoming hematology follow up in early May to recheck iron and Vitamin B12 levels She remains on a Prenatal vitamin daily and RX vitamin D 50,000 IU weekly Energy levels have improved  Continue current supplements Keep upcoming visit with hematology  Orders: -     Prealbumin  Morbid obesity -     Hemoglobin A1c -     Zepbound; Inject 7.5 mg into the skin once a week.  Dispense: 2 mL; Refill: 1  Vitamin D deficiency Assessment & Plan: Last vitamin D Lab Results  Component Value Date   VD25OH 13.2 (L) 07/04/2022  She is taking RX vitamin D weekly and is due for a level today.  Denies adverse Ses.  Goal 50-70.  Orders: -     Vitamin D (Ergocalciferol); Take 1 capsule (50,000 Units total) by mouth every 7 (seven) days.  Dispense: 5 capsule; Refill: 0 -     VITAMIN D 25 Hydroxy (Vit-D Deficiency, Fractures)  BMI 40.0-44.9, adult      She was informed of the importance of frequent follow up  visits to maximize her success with intensive lifestyle modifications for her multiple health conditions.   ATTESTASTION STATEMENTS:  Reviewed by clinician on day of visit: allergies, medications, problem list, medical history, surgical history, family history, social history, and previous encounter notes pertinent to obesity diagnosis.   I have personally spent 30 minutes total time today in preparation, patient care, nutritional counseling and documentation for this visit, including the following: review of clinical lab tests; review of medical tests/procedures/services.      Carrie Brink, DO DABFM, DABOM [facility] [address] [place], [place] [address] [phone]

## 2023-01-26 LAB — VITAMIN D 25 HYDROXY (VIT D DEFICIENCY, FRACTURES): Vit D, 25-Hydroxy: 26 ng/mL — ABNORMAL LOW (ref 30.0–100.0)

## 2023-01-26 LAB — PREALBUMIN: PREALBUMIN: 18 mg/dL (ref 14–35)

## 2023-01-26 LAB — HEMOGLOBIN A1C
Est. average glucose Bld gHb Est-mCnc: 111 mg/dL
Hgb A1c MFr Bld: 5.5 % (ref 4.8–5.6)

## 2023-02-06 ENCOUNTER — Inpatient Hospital Stay: Payer: BC Managed Care – PPO | Attending: Hematology and Oncology

## 2023-02-16 ENCOUNTER — Other Ambulatory Visit (INDEPENDENT_AMBULATORY_CARE_PROVIDER_SITE_OTHER): Payer: Self-pay | Admitting: Family Medicine

## 2023-02-16 DIAGNOSIS — E559 Vitamin D deficiency, unspecified: Secondary | ICD-10-CM

## 2023-02-20 ENCOUNTER — Encounter (INDEPENDENT_AMBULATORY_CARE_PROVIDER_SITE_OTHER): Payer: Self-pay

## 2023-02-21 ENCOUNTER — Other Ambulatory Visit (INDEPENDENT_AMBULATORY_CARE_PROVIDER_SITE_OTHER): Payer: Self-pay | Admitting: Family Medicine

## 2023-02-21 MED ORDER — ZEPBOUND 5 MG/0.5ML ~~LOC~~ SOAJ
5.0000 mg | SUBCUTANEOUS | 0 refills | Status: DC
Start: 2023-02-21 — End: 2023-02-28

## 2023-02-21 NOTE — Telephone Encounter (Signed)
From: Becky Augusta To: Office of Glennis Brink, DO Sent: 02/19/2023 9:23 AM EDT Subject: Medication Renewal Request  Refills have been requested for the following medications:   tirzepatide (ZEPBOUND) 5 MG/0.5ML Pen [Alajiah Dutkiewicz E Dontavius Keim]  Patient Comment: I have not been able to get tye higher dosage filled. Can we try this one please?   Preferred pharmacy: [id number] - [place], [place] - [address] Delivery method: Daryll Drown

## 2023-02-22 ENCOUNTER — Other Ambulatory Visit: Payer: Self-pay | Admitting: Cardiology

## 2023-02-28 ENCOUNTER — Other Ambulatory Visit (HOSPITAL_COMMUNITY): Payer: Self-pay

## 2023-02-28 ENCOUNTER — Encounter (INDEPENDENT_AMBULATORY_CARE_PROVIDER_SITE_OTHER): Payer: Self-pay | Admitting: Family Medicine

## 2023-02-28 ENCOUNTER — Ambulatory Visit (INDEPENDENT_AMBULATORY_CARE_PROVIDER_SITE_OTHER): Payer: BC Managed Care – PPO | Admitting: Family Medicine

## 2023-02-28 VITALS — BP 132/85 | HR 70 | Temp 97.8°F | Ht 71.0 in | Wt 309.0 lb

## 2023-02-28 DIAGNOSIS — E559 Vitamin D deficiency, unspecified: Secondary | ICD-10-CM | POA: Diagnosis not present

## 2023-02-28 DIAGNOSIS — K9189 Other postprocedural complications and disorders of digestive system: Secondary | ICD-10-CM | POA: Diagnosis not present

## 2023-02-28 DIAGNOSIS — D508 Other iron deficiency anemias: Secondary | ICD-10-CM | POA: Diagnosis not present

## 2023-02-28 DIAGNOSIS — K912 Postsurgical malabsorption, not elsewhere classified: Secondary | ICD-10-CM | POA: Diagnosis not present

## 2023-02-28 DIAGNOSIS — Z903 Acquired absence of stomach [part of]: Secondary | ICD-10-CM

## 2023-02-28 DIAGNOSIS — Z6841 Body Mass Index (BMI) 40.0 and over, adult: Secondary | ICD-10-CM

## 2023-02-28 MED ORDER — VITAMIN D (ERGOCALCIFEROL) 1.25 MG (50000 UNIT) PO CAPS
50000.0000 [IU] | ORAL_CAPSULE | ORAL | 0 refills | Status: DC
Start: 2023-02-28 — End: 2023-04-03
  Filled 2023-02-28: qty 5, 35d supply, fill #0

## 2023-02-28 MED ORDER — ZEPBOUND 10 MG/0.5ML ~~LOC~~ SOAJ
10.0000 mg | SUBCUTANEOUS | 0 refills | Status: DC
Start: 2023-02-28 — End: 2023-03-14
  Filled 2023-02-28 – 2023-03-13 (×5): qty 2, 28d supply, fill #0

## 2023-02-28 NOTE — Assessment & Plan Note (Signed)
Reviewed her progress She is 5 mos postpartum birth of a healthy baby girl Hunger increased without Zepbound x 4 weeks due to pharmacy shortages which lead to a 10 lb weight gain Stress at home also contributing to getting off meal plan and sporadic exercise Plans to walk more outdoors Low energy levels also have been a factor  Resume Zepbound weekly injections Aim for a 30 min walk 3-4 x a week for stress reduction

## 2023-02-28 NOTE — Assessment & Plan Note (Signed)
Due to gastric bypass surgery Has volume restriction at mealtime, needing to eat 5-6 small meals per day and drink water outside of mealtime Taking a PNV + RX D weekly + B12 injections monthly and has needed IV iron infusions in past Has f/u with hematology tor recheck B12 and iron C/o fatigue  Continue current supplements Be sure that 2-3 snacks daily have >15 g of protein each due to prealbumin of 18 (should be 20-40 post gastric bypass) F/u with hematology

## 2023-02-28 NOTE — Assessment & Plan Note (Signed)
She has a hx of IDA following gastric bypass and has required IV iron infusions. Her last one was during pregnancy Dec 2023. She reports feeling more tired Denies PICCA syndrome  Menses are regulating, not heavy Not on an oral iron supplement  Keep upcoming f/u with hematology to recheck iron levels

## 2023-02-28 NOTE — Assessment & Plan Note (Signed)
Last vitamin D Lab Results  Component Value Date   VD25OH 26.0 (L) 01/25/2023   Reviewed labs from last visit Vitamin D improving from 13.2 to 26. Energy level still low Reports good compliance taking RX vitamin D 50,000 IU weekly.  Recheck level in 3-4 mos

## 2023-02-28 NOTE — Progress Notes (Signed)
Office: [phone]  /  Fax: [phone]  WEIGHT SUMMARY AND BIOMETRICS  Starting Date: 07/04/22  Starting Weight: 314lb   Weight Lost Since Last Visit: 0   Vitals Temp: 97.8 F (36.6 C) BP: 132/85 Pulse Rate: 70 SpO2: 99 %   Body Composition  Body Fat %: 49.3 % Fat Mass (lbs): 152.4 lbs Muscle Mass (lbs): 148.8 lbs Total Body Water (lbs): 112.4 lbs Visceral Fat Rating : 14     HPI  Chief Complaint: OBESITY  Carrie Shaw is here to discuss her progress with her obesity treatment plan. She is on the the Category 3 Plan and states she is following her eating plan approximately 85 % of the time. She states she is exercising 30 minutes 2-3 times per week.   Interval History:  Since last office visit she is up 11 lb She had a 4 week gap without Zepbound.   She did ramp up to 7.5 mg dose and just started this Her appetite did increase without the medication She denies issues with nausea, heartburn or constipation She has been under a lot of stress She sees hematology in June to recheck iron and B12 She is s/p gastric bypass.  Last iron infusion infusion Dec She has been tired.  Getting B12 monthly She is taking a prenatal vitamin daily  Pharmacotherapy: Zepbound 7.5 mg weekly   PHYSICAL EXAM:  Blood pressure 132/85, pulse 70, temperature 97.8 F (36.6 C), height 5\' 11"  (1.803 m), weight (!) 309 lb (140.2 kg), SpO2 99 %, unknown if currently breastfeeding. Body mass index is 43.1 kg/m.  General: She is overweight, cooperative, alert, well developed, and in no acute distress. PSYCH: Has normal mood, affect and thought process.   Lungs: Normal breathing effort, no conversational dyspnea.   ASSESSMENT AND PLAN  TREATMENT PLAN FOR OBESITY:  Recommended Dietary Goals  Carrie Shaw is currently in the action stage of change. As such, her goal is to continue weight management plan. She has agreed to the Category 3 Plan.  Behavioral Intervention  We discussed  the following Behavioral Modification Strategies today: increasing lean protein intake, decreasing simple carbohydrates , increasing vegetables, increasing lower glycemic fruits, increasing water intake, work on meal planning and preparation, reading food labels , keeping healthy foods at home, continue to practice mindfulness when eating, planning for success, and better snacking choices.  Additional resources provided today: NA  Recommended Physical Activity Goals  Carrie Shaw has been advised to work up to 150 minutes of moderate intensity aerobic activity a week and strengthening exercises 2-3 times per week for cardiovascular health, weight loss maintenance and preservation of muscle mass.   She has agreed to Start aerobic activity with a goal of 150 minutes a week at moderate intensity.   Pharmacotherapy changes for the treatment of obesity: finish out Zepbound 7.5 mg weekly injection then increase to 10 mg weekly injection  ASSOCIATED CONDITIONS ADDRESSED TODAY  Intestinal malabsorption following gastrectomy Assessment & Plan: Due to gastric bypass surgery Has volume restriction at mealtime, needing to eat 5-6 small meals per day and drink water outside of mealtime Taking a PNV + RX D weekly + B12 injections monthly and has needed IV iron infusions in past Has f/u with hematology tor recheck B12 and iron C/o fatigue  Continue current supplements Be sure that 2-3 snacks daily have >15 g of protein each due to prealbumin of 18 (should be 20-40 post gastric bypass) F/u with hematology   Morbid obesity (HCC) with starting BMI 43 Assessment &  Plan: Reviewed her progress She is 5 mos postpartum birth of a healthy baby girl Hunger increased without Zepbound x 4 weeks due to pharmacy shortages which lead to a 10 lb weight gain Stress at home also contributing to getting off meal plan and sporadic exercise Plans to walk more outdoors Low energy levels also have been a factor  Resume  Zepbound weekly injections Aim for a 30 min walk 3-4 x a week for stress reduction  Orders: -     Zepbound; Inject 10 mg into the skin once a week.  Dispense: 2 mL; Refill: 0  BMI 40.0-44.9, adult (HCC)  Vitamin D deficiency Assessment & Plan: Last vitamin D Lab Results  Component Value Date   VD25OH 26.0 (L) 01/25/2023   Reviewed labs from last visit Vitamin D improving from 13.2 to 26. Energy level still low Reports good compliance taking RX vitamin D 50,000 IU weekly.  Recheck level in 3-4 mos  Orders: -     Vitamin D (Ergocalciferol); Take 1 capsule (50,000 Units total) by mouth every 7 (seven) days.  Dispense: 5 capsule; Refill: 0  Iron deficiency anemia after gastrectomy Assessment & Plan: She has a hx of IDA following gastric bypass and has required IV iron infusions. Her last one was during pregnancy Dec 2023. She reports feeling more tired Denies PICCA syndrome  Menses are regulating, not heavy Not on an oral iron supplement  Keep upcoming f/u with hematology to recheck iron levels       She was informed of the importance of frequent follow up visits to maximize her success with intensive lifestyle modifications for her multiple health conditions.   ATTESTASTION STATEMENTS:  Reviewed by clinician on day of visit: allergies, medications, problem list, medical history, surgical history, family history, social history, and previous encounter notes pertinent to obesity diagnosis.   I have personally spent 30 minutes total time today in preparation, patient care, nutritional counseling and documentation for this visit, including the following: review of clinical lab tests; review of medical tests/procedures/services.      Glennis Brink, DO DABFM, DABOM [facility] [address] [place], [place] [address] [phone]

## 2023-03-01 ENCOUNTER — Other Ambulatory Visit: Payer: Self-pay

## 2023-03-06 ENCOUNTER — Inpatient Hospital Stay: Payer: BC Managed Care – PPO | Attending: Hematology and Oncology

## 2023-03-12 ENCOUNTER — Encounter (INDEPENDENT_AMBULATORY_CARE_PROVIDER_SITE_OTHER): Payer: Self-pay

## 2023-03-12 ENCOUNTER — Encounter (HOSPITAL_COMMUNITY): Payer: Self-pay

## 2023-03-12 ENCOUNTER — Other Ambulatory Visit (HOSPITAL_COMMUNITY): Payer: Self-pay

## 2023-03-12 ENCOUNTER — Encounter: Payer: Self-pay | Admitting: Hematology and Oncology

## 2023-03-13 ENCOUNTER — Other Ambulatory Visit (HOSPITAL_COMMUNITY): Payer: Self-pay

## 2023-03-14 MED ORDER — ZEPBOUND 10 MG/0.5ML ~~LOC~~ SOAJ
10.0000 mg | SUBCUTANEOUS | 0 refills | Status: DC
Start: 2023-03-14 — End: 2023-04-03

## 2023-03-15 ENCOUNTER — Encounter (INDEPENDENT_AMBULATORY_CARE_PROVIDER_SITE_OTHER): Payer: Self-pay

## 2023-03-26 ENCOUNTER — Other Ambulatory Visit (HOSPITAL_COMMUNITY): Payer: Self-pay

## 2023-03-29 DIAGNOSIS — Z01419 Encounter for gynecological examination (general) (routine) without abnormal findings: Secondary | ICD-10-CM | POA: Diagnosis not present

## 2023-03-29 DIAGNOSIS — R5383 Other fatigue: Secondary | ICD-10-CM | POA: Diagnosis not present

## 2023-03-30 LAB — HM PAP SMEAR: HPV, high-risk: NEGATIVE

## 2023-04-03 ENCOUNTER — Other Ambulatory Visit: Payer: Self-pay

## 2023-04-03 ENCOUNTER — Encounter (INDEPENDENT_AMBULATORY_CARE_PROVIDER_SITE_OTHER): Payer: Self-pay | Admitting: Family Medicine

## 2023-04-03 ENCOUNTER — Ambulatory Visit (INDEPENDENT_AMBULATORY_CARE_PROVIDER_SITE_OTHER): Payer: BC Managed Care – PPO | Admitting: Family Medicine

## 2023-04-03 ENCOUNTER — Inpatient Hospital Stay: Payer: BC Managed Care – PPO | Attending: Hematology and Oncology

## 2023-04-03 VITALS — BP 145/87 | HR 85 | Temp 98.9°F | Resp 18

## 2023-04-03 VITALS — BP 119/81 | HR 79 | Temp 98.3°F | Ht 71.0 in | Wt 296.0 lb

## 2023-04-03 DIAGNOSIS — R5383 Other fatigue: Secondary | ICD-10-CM | POA: Diagnosis not present

## 2023-04-03 DIAGNOSIS — E538 Deficiency of other specified B group vitamins: Secondary | ICD-10-CM | POA: Insufficient documentation

## 2023-04-03 DIAGNOSIS — Z9884 Bariatric surgery status: Secondary | ICD-10-CM

## 2023-04-03 DIAGNOSIS — Z903 Acquired absence of stomach [part of]: Secondary | ICD-10-CM

## 2023-04-03 DIAGNOSIS — Z6841 Body Mass Index (BMI) 40.0 and over, adult: Secondary | ICD-10-CM

## 2023-04-03 DIAGNOSIS — D508 Other iron deficiency anemias: Secondary | ICD-10-CM

## 2023-04-03 DIAGNOSIS — R635 Abnormal weight gain: Secondary | ICD-10-CM

## 2023-04-03 DIAGNOSIS — E559 Vitamin D deficiency, unspecified: Secondary | ICD-10-CM | POA: Diagnosis not present

## 2023-04-03 DIAGNOSIS — K912 Postsurgical malabsorption, not elsewhere classified: Secondary | ICD-10-CM

## 2023-04-03 MED ORDER — CYANOCOBALAMIN 1000 MCG/ML IJ SOLN
1000.0000 ug | Freq: Once | INTRAMUSCULAR | Status: AC
  Administered 2023-04-03: 1000 ug via INTRAMUSCULAR

## 2023-04-03 MED ORDER — ZEPBOUND 12.5 MG/0.5ML ~~LOC~~ SOAJ
12.5000 mg | SUBCUTANEOUS | 0 refills | Status: DC
Start: 2023-04-03 — End: 2023-05-03

## 2023-04-03 MED ORDER — VITAMIN D (ERGOCALCIFEROL) 1.25 MG (50000 UNIT) PO CAPS
50000.0000 [IU] | ORAL_CAPSULE | ORAL | 0 refills | Status: DC
Start: 2023-04-03 — End: 2023-05-01

## 2023-04-03 NOTE — Assessment & Plan Note (Signed)
Last vitamin D Lab Results  Component Value Date   VD25OH 26.0 (L) 01/25/2023   Taking RX vitamin D weekly x 2 mos C/o fatigue Recheck level today

## 2023-04-03 NOTE — Progress Notes (Signed)
Office: [phone]  /  Fax: [phone]  WEIGHT SUMMARY AND BIOMETRICS  Starting Date: 07/04/22  Starting Weight: 314lb   Weight Lost Since Last Visit: 13lb   Vitals Temp: 98.3 F (36.8 C) BP: 119/81 Pulse Rate: 79 SpO2: 100 %   Body Composition  Body Fat %: 45.1 % Fat Mass (lbs): 133.6 lbs Muscle Mass (lbs): 154.4 lbs Total Body Water (lbs): 101.8 lbs Visceral Fat Rating : 12     HPI  Chief Complaint: OBESITY  Carrie Shaw is here to discuss her progress with her obesity treatment plan. She is on the the Category 3 Plan and states she is following her eating plan approximately 85 % of the time. She states she is walking for 30-45 minutes 2-3 days a week.    Interval History:  Since last office visit she is down 13 lb in the past month She had a 2 week gap without Zepbound and has been hungrier She is tolerating Zepbound 10 mg weekly injection without problems She is getting in lean protein with meals Energy level remains lower She is getting 6-8 hrs of sleep.  Her 26 mos old baby still awakens most nights She has been working out less due to fatigue He next weight loss is 18 lb in 8 mos She is 6 mos post partum, not on birth control  Pharmacotherapy: Zepbound 10 mg weekly  PHYSICAL EXAM:  Blood pressure 119/81, pulse 79, temperature 98.3 F (36.8 C), height 5\' 11"  (1.803 m), weight 296 lb (134.3 kg), SpO2 100 %, unknown if currently breastfeeding. Body mass index is 41.28 kg/m.  General: She is overweight, cooperative, alert, well developed, and in no acute distress. PSYCH: Has normal mood, affect and thought process.   Lungs: Normal breathing effort, no conversational dyspnea.   ASSESSMENT AND PLAN  TREATMENT PLAN FOR OBESITY:  Recommended Dietary Goals  [name] is currently in the action stage of change. As such, her goal is to continue weight management plan. She has agreed to the Category 3 Plan.  Behavioral Intervention  We discussed the  following Behavioral Modification Strategies today: increasing lean protein intake, decreasing simple carbohydrates , increasing vegetables, increasing lower glycemic fruits, increasing water intake, continue to practice mindfulness when eating, and planning for success.  Additional resources provided today: NA  Recommended Physical Activity Goals  [name] has been advised to work up to 150 minutes of moderate intensity aerobic activity a week and strengthening exercises 2-3 times per week for cardiovascular health, weight loss maintenance and preservation of muscle mass.   She has agreed to [employer] strengthening exercises with a goal of 2-3 sessions a week   Pharmacotherapy changes for the treatment of obesity: Zepbound increase to 12.5 mg weekly  ASSOCIATED CONDITIONS ADDRESSED TODAY  Intestinal malabsorption following gastrectomy Assessment & Plan: Pt has had deficiencies in B12, iron and vitamin D post VSG She c/o increased fatigue today Last IV iron infusion ~ 7-8 mos ago and levels have not been rechecked She is not on an iron supplement Takes a MVI daily, B12 injection monthly (missed 2 mos) and RX vitamin D weekly  Check levels today   Orders: -     Iron and TIBC -     Ferritin -     Vitamin B12 -     VITAMIN D 25 Hydroxy (Vit-D Deficiency, Fractures)  Morbid obesity (HCC) with starting BMI 43 -     Zepbound; Inject 12.5 mg into the skin once a week.  Dispense: 2 mL; Refill:  0  Vitamin D deficiency Assessment & Plan: Last vitamin D Lab Results  Component Value Date   VD25OH 26.0 (L) 01/25/2023   Taking RX vitamin D weekly x 2 mos C/o fatigue Recheck level today  Orders: -     Vitamin D (Ergocalciferol); Take 1 capsule (50,000 Units total) by mouth every 7 (seven) days.  Dispense: 5 capsule; Refill: 0  Other fatigue -     Comprehensive metabolic panel -     TSH Rfx on Abnormal to Free T4  BMI 40.0-44.9, adult (HCC)  Weight gain status post gastric  bypass  Weight gain following gastric bypass surgery Assessment & Plan: Improving with dietary changes and Zepbound Has improved volume control with gastric bypass Doing well with GI effects on Zepbound without reflux or nausea Taking MVI daily Focused on 4-5 small meals each with lean protein Added in fruits and veggies Drinks water outside of meals  Plan to add in resistance training 2 x a week       She was informed of the importance of frequent follow up visits to maximize her success with intensive lifestyle modifications for her multiple health conditions.   ATTESTASTION STATEMENTS:  Reviewed by clinician on day of visit: allergies, medications, problem list, medical history, surgical history, family history, social history, and previous encounter notes pertinent to obesity diagnosis.   I have personally spent 30 minutes total time today in preparation, patient care, nutritional counseling and documentation for this visit, including the following: review of clinical lab tests; review of medical tests/procedures/services.      Glennis Brink, DO DABFM, DABOM [facility] [address] [place], [place] [address] [phone]

## 2023-04-03 NOTE — Assessment & Plan Note (Signed)
Pt has had deficiencies in B12, iron and vitamin D post VSG She c/o increased fatigue today Last IV iron infusion ~ 7-8 mos ago and levels have not been rechecked She is not on an iron supplement Takes a MVI daily, B12 injection monthly (missed 2 mos) and RX vitamin D weekly  Check levels today

## 2023-04-03 NOTE — Assessment & Plan Note (Signed)
Improving with dietary changes and Zepbound Has improved volume control with gastric bypass Doing well with GI effects on Zepbound without reflux or nausea Taking MVI daily Focused on 4-5 small meals each with lean protein Added in fruits and veggies Drinks water outside of meals  Plan to add in resistance training 2 x a week

## 2023-04-04 LAB — COMPREHENSIVE METABOLIC PANEL
ALT: 34 IU/L — ABNORMAL HIGH (ref 0–32)
AST: 19 IU/L (ref 0–40)
Albumin: 4.6 g/dL (ref 3.9–4.9)
Alkaline Phosphatase: 68 IU/L (ref 44–121)
BUN/Creatinine Ratio: 21 (ref 9–23)
BUN: 15 mg/dL (ref 6–20)
Bilirubin Total: 0.4 mg/dL (ref 0.0–1.2)
CO2: 19 mmol/L — ABNORMAL LOW (ref 20–29)
Calcium: 9.4 mg/dL (ref 8.7–10.2)
Chloride: 104 mmol/L (ref 96–106)
Creatinine, Ser: 0.7 mg/dL (ref 0.57–1.00)
Globulin, Total: 3.2 g/dL (ref 1.5–4.5)
Glucose: 70 mg/dL (ref 70–99)
Potassium: 5.4 mmol/L — ABNORMAL HIGH (ref 3.5–5.2)
Sodium: 138 mmol/L (ref 134–144)
Total Protein: 7.8 g/dL (ref 6.0–8.5)
eGFR: 115 mL/min/{1.73_m2} (ref >59)

## 2023-04-04 LAB — IRON AND TIBC
Iron Saturation: 20 % (ref 15–55)
Iron: 64 ug/dL (ref 27–159)
Total Iron Binding Capacity: 314 ug/dL (ref 250–450)
UIBC: 250 ug/dL (ref 131–425)

## 2023-04-04 LAB — VITAMIN B12: Vitamin B-12: 284 pg/mL (ref 232–1245)

## 2023-04-04 LAB — VITAMIN D 25 HYDROXY (VIT D DEFICIENCY, FRACTURES): Vit D, 25-Hydroxy: 25.8 ng/mL — ABNORMAL LOW (ref 30.0–100.0)

## 2023-04-04 LAB — FERRITIN: Ferritin: 125 ng/mL (ref 15–150)

## 2023-04-04 LAB — TSH RFX ON ABNORMAL TO FREE T4: TSH: 1.18 u[IU]/mL (ref 0.450–4.500)

## 2023-04-16 ENCOUNTER — Other Ambulatory Visit (INDEPENDENT_AMBULATORY_CARE_PROVIDER_SITE_OTHER): Payer: Self-pay | Admitting: Family Medicine

## 2023-04-16 DIAGNOSIS — E559 Vitamin D deficiency, unspecified: Secondary | ICD-10-CM

## 2023-04-17 ENCOUNTER — Other Ambulatory Visit (INDEPENDENT_AMBULATORY_CARE_PROVIDER_SITE_OTHER): Payer: Self-pay | Admitting: Family Medicine

## 2023-04-17 DIAGNOSIS — E559 Vitamin D deficiency, unspecified: Secondary | ICD-10-CM

## 2023-05-01 ENCOUNTER — Inpatient Hospital Stay: Payer: BC Managed Care – PPO

## 2023-05-01 ENCOUNTER — Encounter: Payer: Self-pay | Admitting: Adult Health

## 2023-05-01 ENCOUNTER — Inpatient Hospital Stay (HOSPITAL_BASED_OUTPATIENT_CLINIC_OR_DEPARTMENT_OTHER): Payer: BC Managed Care – PPO | Admitting: Adult Health

## 2023-05-01 VITALS — BP 138/93 | HR 82 | Temp 97.7°F | Resp 17 | Wt 296.2 lb

## 2023-05-01 DIAGNOSIS — E538 Deficiency of other specified B group vitamins: Secondary | ICD-10-CM

## 2023-05-01 DIAGNOSIS — D508 Other iron deficiency anemias: Secondary | ICD-10-CM

## 2023-05-01 DIAGNOSIS — D509 Iron deficiency anemia, unspecified: Secondary | ICD-10-CM

## 2023-05-01 DIAGNOSIS — E559 Vitamin D deficiency, unspecified: Secondary | ICD-10-CM

## 2023-05-01 DIAGNOSIS — K9189 Other postprocedural complications and disorders of digestive system: Secondary | ICD-10-CM | POA: Diagnosis not present

## 2023-05-01 LAB — CBC WITH DIFFERENTIAL/PLATELET
Abs Immature Granulocytes: 0.01 10*3/uL (ref 0.00–0.07)
Basophils Absolute: 0 10*3/uL (ref 0.0–0.1)
Basophils Relative: 0 %
Eosinophils Absolute: 0.2 10*3/uL (ref 0.0–0.5)
Eosinophils Relative: 3 %
HCT: 37.7 % (ref 36.0–46.0)
Hemoglobin: 12.4 g/dL (ref 12.0–15.0)
Immature Granulocytes: 0 %
Lymphocytes Relative: 29 %
Lymphs Abs: 2.1 10*3/uL (ref 0.7–4.0)
MCH: 30.9 pg (ref 26.0–34.0)
MCHC: 32.9 g/dL (ref 30.0–36.0)
MCV: 94 fL (ref 80.0–100.0)
Monocytes Absolute: 0.5 10*3/uL (ref 0.1–1.0)
Monocytes Relative: 7 %
Neutro Abs: 4.5 10*3/uL (ref 1.7–7.7)
Neutrophils Relative %: 61 %
Platelets: 318 10*3/uL (ref 150–400)
RBC: 4.01 MIL/uL (ref 3.87–5.11)
RDW: 13.7 % (ref 11.5–15.5)
WBC: 7.3 10*3/uL (ref 4.0–10.5)
nRBC: 0 % (ref 0.0–0.2)

## 2023-05-01 MED ORDER — VITAMIN D (ERGOCALCIFEROL) 1.25 MG (50000 UNIT) PO CAPS: 50000.0000 [IU] | ORAL_CAPSULE | ORAL | 1 refills | Status: DC

## 2023-05-01 MED ORDER — CYANOCOBALAMIN 1000 MCG/ML IJ SOLN
1000.0000 ug | Freq: Once | INTRAMUSCULAR | Status: AC
  Administered 2023-05-01: 1000 ug via INTRAMUSCULAR
  Filled 2023-05-01: qty 1

## 2023-05-01 NOTE — Assessment & Plan Note (Signed)
Her most recent B12 level was 284 that she completed on April 03, 2023.  This is improved from previous testing.  I recommended that she continue her monthly B12 injections.  We will repeat B12 level in about 6 months.

## 2023-05-01 NOTE — Progress Notes (Signed)
[facility] Cancer Follow up:    Patient, No Pcp Per No address on file   DIAGNOSIS: Iron deficiency anemia, B12 deficiency  SUMMARY OF HEMATOLOGIC HISTORY: S/p gastric bypass surgery in approximately 2018 and became pregnant in 2023 Oral iron 65mg  elemental iron BID begain in 03/2022 (ferritin 6, sat 4%, iron 19, TIBC 521, hemoglobin 8.5, MCV 76.3) 05/09/2022 hemoglobin 7.9, MCV 75.2; Venofer 200mg  x 5  given beginning 05/15/2022  On 07/29/2022 her cbc was 8.3 and MCV improved to 81.4; ferritin on 10/31 6--was recommended to restart oral iron 08/10/2022 ferritin is 4--Venofer 200mg  x 5 began on 08/16/2022 Hemoglobin 09/19/2022 10.5, MCV 84.7 Venofer 200mg  x 4 beginning 12/01/2022 Ferritin 04/03/2023: 125  CURRENT THERAPY: Intermittent IV iron, B12 injections  INTERVAL HISTORY: Carrie Shaw 37 y.o. female returns for follow-up and evaluation.  Her most recent lab testing indicated her ferritin was 125 demonstrating response to the IV iron she received in March.  She continues to receive B12 injections every 4 weeks.  She is tolerating these well.  Her B12 level has slowly increased.  She also has a vitamin D deficiency and needs her vitamin D prescription refilled.  She continues to have some mild fatigue however notes that this is slightly improved with her daughter being 55 months old now.   Patient Active Problem List   Diagnosis Date Noted   History of gestational hypertension 10/16/2022   Postpartum care following vaginal delivery 09/18/2022   Benign essential HTN, chronic, antepartum, third trimester 09/16/2022   Medication management 09/05/2022   [redacted] weeks gestation of pregnancy 09/05/2022   Gestational hypertension, third trimester 09/05/2022   B12 deficiency 07/18/2022   Vitamin D deficiency 07/18/2022   Absolute anemia 07/18/2022   Other fatigue 07/04/2022   SOBOE (shortness of breath on exertion) 07/04/2022   [redacted] weeks gestation of pregnancy 07/04/2022    Weight gain following gastric bypass surgery 07/04/2022   Intestinal malabsorption following gastrectomy 07/04/2022   Depression 07/04/2022   Iron deficiency anemia after gastrectomy 03/29/2022   Postpartum hypertension 08/29/2017   Intraoperative bladder injury 08/17/2017   Pelvic peritoneal adhesions, female 08/17/2017   Ectopic pregnancy without intrauterine pregnancy 08/16/2017   Morbid obesity (HCC) with starting BMI 43 04/23/2013    has No Known Allergies.  MEDICAL HISTORY: Past Medical History:  Diagnosis Date   ADD (attention deficit disorder)    Anemia    Anxiety    B12 deficiency    Back pain    Bilateral swelling of feet    Depression    Gallbladder problem    HSV infection    Hypertension    Joint pain    Lower extremity edema    Obesity    Pregnancy induced hypertension    Sleep apnea    mild no mask   SOB (shortness of breath)    SVD (spontaneous vaginal delivery)    x 2   Termination of pregnancy (fetus)    x 2   UTI (urinary tract infection)    Vitamin B12 deficiency    Vitamin D deficiency     SURGICAL HISTORY: Past Surgical History:  Procedure Laterality Date   APPENDECTOMY     CYSTOSCOPY N/A 08/16/2017   Procedure: CYSTOSCOPY;  Surgeon: Conan Bowens, MD;  Location: [facility];  Service: Gynecology;  Laterality: N/A;   GASTRIC ROUX-EN-Y N/A 01/01/2018   Procedure: LAPAROSCOPIC ROUX-EN-Y GASTRIC BYPASS WITH UPPER ENDOSCOPY;  Surgeon: Gaynelle Adu, MD;  Location: [facility];  Service: General;  Laterality: N/A;   LAPAROSCOPY N/A 08/16/2017   Procedure: LAPAROSCOPY DIAGNOSTIC;  Surgeon: Conan Bowens, MD;  Location: [facility];  Service: Gynecology;  Laterality: N/A;   LAPAROTOMY N/A 08/16/2017   Procedure: EXPLORATORY LAPAROTOMY WITH REMOVAL OF LEFT ECTOPIC PREGNANCY, LEFT SALPINGECTOMY AND CYSTOTOMY OF BLADDER;  Surgeon: Conan Bowens, MD;  Location: [facility];  Service: Gynecology;  Laterality: N/A;   LYSIS OF ADHESION N/A 08/16/2017   Procedure: LYSIS  OF ADHESION;  Surgeon: Conan Bowens, MD;  Location: [facility];  Service: Gynecology;  Laterality: N/A;   Tummy tuck     10/2019   UNILATERAL SALPINGECTOMY Left 08/16/2017   Procedure: UNILATERAL SALPINGECTOMY;  Surgeon: Conan Bowens, MD;  Location: [facility];  Service: Gynecology;  Laterality: Left;    SOCIAL HISTORY: Social History   Socioeconomic History   Marital status: Married    Spouse name: Fraser Din   Number of children: 2   Years of education: Not on file   Highest education level: Not on file  Occupational History   Occupation: Clinical biochemist Rep  Tobacco Use   Smoking status: Former    Current packs/day: 0.00    Average packs/day: 0.3 packs/day for 10.0 years (2.5 ttl pk-yrs)    Types: Cigarettes    Start date: 12/2011    Quit date: 11/2021    Years since quitting: 1.4   Smokeless tobacco: Never  Vaping Use   Vaping status: Never Used  Substance and Sexual Activity   Alcohol use: Not Currently    Comment: rare   Drug use: No   Sexual activity: Yes    Partners: Male    Birth control/protection: None  Other Topics Concern   Not on file  Social History Narrative   She works at [employer]    Two children   Not married       She likes to shop and travel    Social Determinants of Corporate investment banker Strain: Not on file  Food Insecurity: No Food Insecurity (09/16/2022)   Hunger Vital Sign    Worried About Running Out of Food in the Last Year: Never true    Ran Out of Food in the Last Year: Never true  Transportation Needs: No Transportation Needs (09/16/2022)   PRAPARE - Administrator, Civil Service (Medical): No    Lack of Transportation (Non-Medical): No  Physical Activity: Not on file  Stress: Not on file  Social Connections: Not on file  Intimate Partner Violence: Not At Risk (09/16/2022)   Humiliation, Afraid, Rape, and Kick questionnaire    Fear of Current or Ex-Partner: No    Emotionally Abused: No    Physically Abused: No     Sexually Abused: No    FAMILY HISTORY: Family History  Problem Relation Age of Onset   Depression Mother    Hypertension Mother    Anxiety disorder Mother    Sleep apnea Mother    Obesity Mother    Eating disorder Mother    Hypertension Father    Depression Father    Anxiety disorder Father    Sleep apnea Father    Alcoholism Father    Drug abuse Father    Obesity Father    Schizophrenia Sister    Bipolar disorder Sister     Review of Systems  Constitutional:  Positive for fatigue. Negative for appetite change, chills, fever and unexpected weight change.  HENT:   Negative for hearing loss, lump/mass and trouble  swallowing.   Eyes:  Negative for eye problems and icterus.  Respiratory:  Negative for chest tightness, cough and shortness of breath.   Cardiovascular:  Negative for chest pain, leg swelling and palpitations.  Gastrointestinal:  Negative for abdominal distention, abdominal pain, constipation, diarrhea, nausea and vomiting.  Endocrine: Negative for hot flashes.  Genitourinary:  Negative for difficulty urinating.   Musculoskeletal:  Negative for arthralgias.  Skin:  Negative for itching and rash.  Neurological:  Negative for dizziness, extremity weakness, headaches and numbness.  Hematological:  Negative for adenopathy. Does not bruise/bleed easily.  Psychiatric/Behavioral:  Negative for depression. The patient is not nervous/anxious.       PHYSICAL EXAMINATION    Vitals:   05/01/23 1135  BP: (!) 138/93  Pulse: 82  Resp: 17  Temp: 97.7 F (36.5 C)  SpO2: 100%   Exam deferred in lieu of counseling   LABORATORY DATA:  CBC    Component Value Date/Time   WBC 7.3 05/01/2023 1103   RBC 4.01 05/01/2023 1103   HGB 12.4 05/01/2023 1103   HGB 12.3 11/14/2022 1054   HGB 9.8 (L) 09/13/2022 1027   HCT 37.7 05/01/2023 1103   HCT 32.0 (L) 09/13/2022 1027   PLT 318 05/01/2023 1103   PLT 353 11/14/2022 1054   PLT 281 09/13/2022 1027   MCV 94.0 05/01/2023  1103   MCV 86 09/13/2022 1027   MCH 30.9 05/01/2023 1103   MCHC 32.9 05/01/2023 1103   RDW 13.7 05/01/2023 1103   RDW 21.8 (H) 09/13/2022 1027   LYMPHSABS 2.1 05/01/2023 1103   LYMPHSABS 2.0 01/06/2020 1103   MONOABS 0.5 05/01/2023 1103   EOSABS 0.2 05/01/2023 1103   EOSABS 0.1 01/06/2020 1103   BASOSABS 0.0 05/01/2023 1103   BASOSABS 0.0 01/06/2020 1103     ASSESSMENT and THERAPY PLAN:   B12 deficiency Her most recent B12 level was 284 that she completed on April 03, 2023.  This is improved from previous testing.  I recommended that she continue her monthly B12 injections.  We will repeat B12 level in about 6 months.  Iron deficiency anemia after gastrectomy Her ferritin level is 125 on July 2 after receiving 800 mg of IV iron in March.  We will continue to monitor her iron levels since she has decreased iron absorption due to her weight loss surgery.  We will repeat testing in 6 months.  We will see Renesha back in 1 year for her follow-up.  All questions were answered. The patient knows to call the clinic with any problems, questions or concerns. We can certainly see the patient much sooner if necessary.  Total encounter time:20 minutes*in face-to-face visit time, chart review, lab review, care coordination, order entry, and documentation of the encounter time.    Lillard Anes, NP 05/01/23 12:02 PM Medical Oncology and Hematology [facility] [address] [place], [place] [address] Tel. [phone]    Fax. [phone]  *Total Encounter Time as defined by the Centers for Medicare and Medicaid Services includes, in addition to the face-to-face time of a patient visit (documented in the note above) non-face-to-face time: obtaining and reviewing outside history, ordering and reviewing medications, tests or procedures, care coordination (communications with other health care professionals or caregivers) and documentation in the medical record.

## 2023-05-01 NOTE — Progress Notes (Unsigned)
TeleHealth Visit:  This visit was completed with telemedicine (audio/video) technology. Carrie Shaw has verbally consented to this TeleHealth visit. The patient is located at home, the provider is located at home. The participants in this visit include the listed provider and patient. The visit was conducted today via MyChart video.  OBESITY Carrie Shaw is here to discuss her progress with her obesity treatment plan along with follow-up of her obesity related diagnoses.   Today's visit was # 10 Starting weight: 314 lbs Starting date: 07/04/22 Weight at last in office visit: 296 lbs on 04/03/23 Total weight loss: 18 lbs at last in office visit on 04/03/23. Today's reported weight (05/03/23): none reported  Nutrition Plan: the Category 3 plan   Current exercise:  walking for 30-45 minutes 2-3 days a week.   Interim History:  She struggles to get in her protein because her appetite is suppressed from the Zepbound.  Breakfast and lunch she does fairly well but dinner she eats very little protein. Water intake is low.  Typical day of food: Breakfast: 2-3 eggs and protein shake Lunch: Protein soup Carrie Shaw) mixed with [place] soup. Dinner: has a small amount of protein,   Pharmacotherapy: Carrie Shaw is on Zepbound 12.5 mg SQ weekly Adverse side effects: None Hunger is well controlled.  Cravings are well controlled.  Assessment/Plan:  We discussed recent lab results in depth.  1.  Weight gain following gastric bypass Status post Roux-en-Y gastric bypass on 01/01/2018 by Dr. Andrey Campanile will.  Her top weight was 415 pounds but weight at the time of surgery was 384 pounds.  Her low weight was 265 pounds but she began regaining.  Takes a multivitamin daily.  Takes B12 shots.  Currently on vitamin D 50 K once weekly. Recently had an iron infusion. Most recent iron level and hemoglobin were normal.  Plan: Increase protein through several small meals. Protein equivalent handout sent via  MyChart. She will begin tracking protein-May use the lose it app.  Goal is 80 g daily minimum. Continue all vitamin supplementation.  2. Vitamin D Deficiency Vitamin D is not at goal of 50.  Most recent vitamin D level was 25.8, slightly decreased from the level in April 2024 (26) despite taking weekly high-dose prescription vitamin D.. She is on  prescription ergocalciferol 50,000 IU weekly. Lab Results  Component Value Date   VD25OH 25.8 (L) 04/03/2023   VD25OH 26.0 (L) 01/25/2023   VD25OH 13.2 (L) 07/04/2022    Plan: Continue and increase dose  prescription ergocalciferol 50,000 IU twice weekly  3. Morbid Obesity: Current BMI 41  Pharmacotherapy Plan Continue and refill  Zepbound 12.5 mg SQ weekly  Carrie Shaw is currently in the action stage of change. As such, her goal is to continue with weight loss efforts.  She has agreed to the Category 3 plan.  1.  Begin tracking protein with minimum goal of 80 g/day. 2.  Discussed ideas for food to increase protein. 3.  Increase water to 64 ounces per day.  Advised her to never leave home without a water bottle. 4.  Eat protein first.  Prioritize protein over carbs.  Exercise goals:  as is  Behavioral modification strategies: increasing lean protein intake, decreasing simple carbohydrates , meal planning , and planning for success.  Carrie Shaw has agreed to follow-up with our clinic in 4 weeks. She will call office to set up her appointment.  No orders of the defined types were placed in this encounter.   Medications Discontinued During This Encounter  Medication  Reason   tirzepatide (ZEPBOUND) 12.5 MG/0.5ML Pen Reorder   Vitamin D, Ergocalciferol, (DRISDOL) 1.25 MG (50000 UNIT) CAPS capsule Reorder     Meds ordered this encounter  Medications   tirzepatide (ZEPBOUND) 12.5 MG/0.5ML Pen    Sig: Inject 12.5 mg into the skin once a week.    Dispense:  2 mL    Refill:  0    Order Specific Question:   Supervising Provider     Answer:   Carolin Sicks   Vitamin D, Ergocalciferol, (DRISDOL) 1.25 MG (50000 UNIT) CAPS capsule    Sig: Take 1 capsule (50,000 Units total) by mouth 2 (two) times a week.    Dispense:  24 capsule    Refill:  0    Order Specific Question:   Supervising Provider    Answer:   Glennis Brink [[id number]]      Objective:   VITALS: Per patient if applicable, see vitals. GENERAL: Alert and in no acute distress. CARDIOPULMONARY: No increased WOB. Speaking in clear sentences.  PSYCH: Pleasant and cooperative. Speech normal rate and rhythm. Affect is appropriate. Insight and judgement are appropriate. Attention is focused, linear, and appropriate.  NEURO: Oriented as arrived to appointment on time with no prompting.   Attestation Statements:   Reviewed by clinician on day of visit: allergies, medications, problem list, medical history, surgical history, family history, social history, and previous encounter notes.   This was prepared with the assistance of Engineer, civil (consulting).  Occasional wrong-word or sound-a-like substitutions may have occurred due to the inherent limitations of voice recognition software.

## 2023-05-01 NOTE — Assessment & Plan Note (Signed)
Her ferritin level is 125 on July 2 after receiving 800 mg of IV iron in March.  We will continue to monitor her iron levels since she has decreased iron absorption due to her weight loss surgery.  We will repeat testing in 6 months.  We will see Carrie Shaw back in 1 year for her follow-up.

## 2023-05-02 ENCOUNTER — Ambulatory Visit (INDEPENDENT_AMBULATORY_CARE_PROVIDER_SITE_OTHER): Payer: BC Managed Care – PPO | Admitting: Family Medicine

## 2023-05-03 ENCOUNTER — Encounter (INDEPENDENT_AMBULATORY_CARE_PROVIDER_SITE_OTHER): Payer: Self-pay | Admitting: Family Medicine

## 2023-05-03 ENCOUNTER — Telehealth (INDEPENDENT_AMBULATORY_CARE_PROVIDER_SITE_OTHER): Payer: BC Managed Care – PPO | Admitting: Family Medicine

## 2023-05-03 DIAGNOSIS — Z6841 Body Mass Index (BMI) 40.0 and over, adult: Secondary | ICD-10-CM | POA: Diagnosis not present

## 2023-05-03 DIAGNOSIS — E559 Vitamin D deficiency, unspecified: Secondary | ICD-10-CM

## 2023-05-03 DIAGNOSIS — Z9884 Bariatric surgery status: Secondary | ICD-10-CM

## 2023-05-03 MED ORDER — ZEPBOUND 12.5 MG/0.5ML ~~LOC~~ SOAJ
12.5000 mg | SUBCUTANEOUS | 0 refills | Status: DC
Start: 2023-05-03 — End: 2023-05-30

## 2023-05-03 MED ORDER — VITAMIN D (ERGOCALCIFEROL) 1.25 MG (50000 UNIT) PO CAPS
50000.0000 [IU] | ORAL_CAPSULE | ORAL | 0 refills | Status: DC
Start: 2023-05-03 — End: 2023-06-25

## 2023-05-23 ENCOUNTER — Other Ambulatory Visit (INDEPENDENT_AMBULATORY_CARE_PROVIDER_SITE_OTHER): Payer: Self-pay | Admitting: Family Medicine

## 2023-05-29 ENCOUNTER — Other Ambulatory Visit (INDEPENDENT_AMBULATORY_CARE_PROVIDER_SITE_OTHER): Payer: Self-pay | Admitting: Family Medicine

## 2023-05-29 ENCOUNTER — Inpatient Hospital Stay: Payer: BC Managed Care – PPO | Attending: Hematology and Oncology

## 2023-05-29 DIAGNOSIS — E538 Deficiency of other specified B group vitamins: Secondary | ICD-10-CM | POA: Diagnosis not present

## 2023-05-29 DIAGNOSIS — D508 Other iron deficiency anemias: Secondary | ICD-10-CM

## 2023-05-29 MED ORDER — CYANOCOBALAMIN 1000 MCG/ML IJ SOLN
1000.0000 ug | Freq: Once | INTRAMUSCULAR | Status: AC
  Administered 2023-05-29: 1000 ug via INTRAMUSCULAR
  Filled 2023-05-29: qty 1

## 2023-05-30 ENCOUNTER — Other Ambulatory Visit (INDEPENDENT_AMBULATORY_CARE_PROVIDER_SITE_OTHER): Payer: Self-pay | Admitting: Family Medicine

## 2023-05-30 MED ORDER — ZEPBOUND 15 MG/0.5ML ~~LOC~~ SOAJ: 15.0000 mg | SUBCUTANEOUS | 0 refills | Status: DC

## 2023-05-30 NOTE — Telephone Encounter (Signed)
Patient is requesting another PA to be completed. She wants to know if you need her medication card. Please call her when you are available.

## 2023-05-30 NOTE — Addendum Note (Signed)
Addended by: Jesse Sans on: 05/30/2023 01:20 PM   Modules accepted: Orders

## 2023-05-30 NOTE — Telephone Encounter (Signed)
Phone call from patient needing another prior authorization for her Zepbound 12.5. I have completed another prior authorization. Waiting on determination.

## 2023-05-31 ENCOUNTER — Telehealth (INDEPENDENT_AMBULATORY_CARE_PROVIDER_SITE_OTHER): Payer: Self-pay

## 2023-05-31 NOTE — Telephone Encounter (Signed)
PA for Zepbound has been approved.

## 2023-06-11 ENCOUNTER — Encounter: Payer: Self-pay | Admitting: Hematology and Oncology

## 2023-06-25 ENCOUNTER — Ambulatory Visit (INDEPENDENT_AMBULATORY_CARE_PROVIDER_SITE_OTHER): Payer: BC Managed Care – PPO | Admitting: Adult Health

## 2023-06-25 ENCOUNTER — Encounter (INDEPENDENT_AMBULATORY_CARE_PROVIDER_SITE_OTHER): Payer: Self-pay | Admitting: Adult Health

## 2023-06-25 VITALS — BP 116/76 | HR 78 | Temp 98.1°F | Ht 71.0 in | Wt 278.0 lb

## 2023-06-25 DIAGNOSIS — Z6839 Body mass index (BMI) 39.0-39.9, adult: Secondary | ICD-10-CM

## 2023-06-25 DIAGNOSIS — E559 Vitamin D deficiency, unspecified: Secondary | ICD-10-CM

## 2023-06-25 DIAGNOSIS — K912 Postsurgical malabsorption, not elsewhere classified: Secondary | ICD-10-CM | POA: Diagnosis not present

## 2023-06-25 DIAGNOSIS — Z903 Acquired absence of stomach [part of]: Secondary | ICD-10-CM

## 2023-06-25 DIAGNOSIS — E669 Obesity, unspecified: Secondary | ICD-10-CM | POA: Diagnosis not present

## 2023-06-25 MED ORDER — ZEPBOUND 15 MG/0.5ML ~~LOC~~ SOAJ
15.0000 mg | SUBCUTANEOUS | 0 refills | Status: DC
Start: 2023-06-25 — End: 2023-07-24

## 2023-06-25 MED ORDER — PRENATAL VITAMINS 27-0.8 MG PO TABS: 1.0000 | ORAL_TABLET | Freq: Every day | ORAL | 0 refills | Status: DC

## 2023-06-25 MED ORDER — VITAMIN D (ERGOCALCIFEROL) 1.25 MG (50000 UNIT) PO CAPS
50000.0000 [IU] | ORAL_CAPSULE | ORAL | 0 refills | Status: DC
Start: 2023-06-25 — End: 2023-07-24

## 2023-06-25 NOTE — Progress Notes (Signed)
WEIGHT SUMMARY AND BIOMETRICS  Vitals Temp: 98.1 F (36.7 C) BP: 116/76 Pulse Rate: 78 SpO2: 98 %   Anthropometric Measurements Height: 5\' 11"  (1.803 m) Weight: 278 lb (126.1 kg) BMI (Calculated): 38.79 Weight at Last Visit: 296 lb Weight Lost Since Last Visit: 18 lb Weight Gained Since Last Visit: 0 Starting Weight: 312 lb Total Weight Loss (lbs): 34 lb (15.4 kg) Peak Weight: 410 lb   Body Composition  Body Fat %: 45.5 % Fat Mass (lbs): 126.6 lbs Muscle Mass (lbs): 144.2 lbs Total Body Water (lbs): 102.2 lbs Visceral Fat Rating : 11   Other Clinical Data Fasting: no Labs: no Today's Visit #: 11 Starting Date: 07/04/22    Chief Complaint:   OBESITY Carrie Shaw is here to discuss her progress with her obesity treatment plan. She is on the the Category 2 Plan and states she is following her eating plan approximately 50 % of the time. She states she is not currently exercising.   Interim History:  [name] provided the following food recall typical of a day: Breakfast: 16 Bean Soup                   Vegetables Lunch: Protein shake or protein soup Dinner: Met protein with 2 cups of vegetables  She estimates to consume at least 80 oz protein/day  Reviewed Bioimpedance results with pt: Muscle Mass: -10.2 lbs Adipose Mass: - 7 lbs  Subjective:   1. Intestinal malabsorption following gastrectomy  Carrie Shaw is status post gastric bypass in 2019.  She went from 410 lbs to 275 lbs in 12 month  Current weight 240 lbs  2. Vitamin D deficiency  Latest Reference Range & Units 01/25/23 14:31 04/03/23 12:20  Vitamin D, 25-Hydroxy 30.0 - 100.0 ng/mL 26.0 (L) 25.8 (L)  (L): Data is abnormally low  She is on twice weekly Ergocalciferol- denies N/V/Muscle Weakness  Assessment/Plan:   1. Intestinal malabsorption following gastrectomy  Continue to avoid liquids when eating. Start   Prenatal Vit-Fe Fumarate-FA (PRENATAL VITAMINS) 27-0.8 MG TABS Take 1  tablet by mouth daily. Dispense: 90 each, Refills: 0 ordered   2. Vitamin D deficiency Refill - Vitamin D, Ergocalciferol, (DRISDOL) 1.25 MG (50000 UNIT) CAPS capsule; Take 1 capsule (50,000 Units total) by mouth 2 (two) times a week.  Dispense: 24 capsule; Refill: 0  3. Obesity,current BMI 39.9 Refill - tirzepatide (ZEPBOUND) 15 MG/0.5ML Pen; Inject 15 mg into the skin once a week.  Dispense: 2 mL; Refill: 0  Carrie Shaw is currently in the action stage of change. As such, her goal is to continue with weight loss efforts. She has agreed to the Category 2 Plan.   Exercise goals: For substantial health benefits, adults should do at least 150 minutes (2 hours and 30 minutes) a week of moderate-intensity, or 75 minutes (1 hour and 15 minutes) a week of vigorous-intensity aerobic physical activity, or an equivalent combination of moderate- and vigorous-intensity aerobic activity. Aerobic activity should be performed in episodes of at least 10 minutes, and preferably, it should be spread throughout the week.  Behavioral modification strategies: increasing lean protein intake, decreasing simple carbohydrates, increasing vegetables, increasing water intake, decreasing eating out, no skipping meals, meal planning and cooking strategies, keeping healthy foods in the home, better snacking choices, and planning for success.  [name] has agreed to follow-up with our clinic in 4 weeks. She was informed of the importance of frequent follow-up visits to maximize her success with intensive lifestyle modifications for  her multiple health conditions.   Objective:   Blood pressure 116/76, pulse 78, temperature 98.1 F (36.7 C), height 5\' 11"  (1.803 m), weight 278 lb (126.1 kg), SpO2 98%, unknown if currently breastfeeding. Body mass index is 38.77 kg/m.  General: Cooperative, alert, well developed, in no acute distress. HEENT: Conjunctivae and lids unremarkable. Cardiovascular: Regular rhythm.  Lungs: Normal  work of breathing. Neurologic: No focal deficits.   Lab Results  Component Value Date   CREATININE 0.70 04/03/2023   BUN 15 04/03/2023   NA 138 04/03/2023   K 5.4 (H) 04/03/2023   CL 104 04/03/2023   CO2 19 (L) 04/03/2023   Lab Results  Component Value Date   ALT 34 (H) 04/03/2023   AST 19 04/03/2023   ALKPHOS 68 04/03/2023   BILITOT 0.4 04/03/2023   Lab Results  Component Value Date   HGBA1C 5.5 01/25/2023   HGBA1C 5.0 07/04/2022   HGBA1C 4.9 01/06/2020   Lab Results  Component Value Date   INSULIN 8.1 07/04/2022   INSULIN 7.8 01/06/2020   Lab Results  Component Value Date   TSH 1.180 04/03/2023   Lab Results  Component Value Date   CHOL 195 07/04/2022   HDL 59 07/04/2022   LDLCALC 119 (H) 07/04/2022   TRIG 96 07/04/2022   CHOLHDL 3.4 12/25/2014   Lab Results  Component Value Date   VD25OH 25.8 (L) 04/03/2023   VD25OH 26.0 (L) 01/25/2023   VD25OH 13.2 (L) 07/04/2022   Lab Results  Component Value Date   WBC 7.3 05/01/2023   HGB 12.4 05/01/2023   HCT 37.7 05/01/2023   MCV 94.0 05/01/2023   PLT 318 05/01/2023   Lab Results  Component Value Date   IRON 64 04/03/2023   TIBC 314 04/03/2023   FERRITIN 125 04/03/2023    Attestation Statements:   Reviewed by clinician on day of visit: allergies, medications, problem list, medical history, surgical history, family history, social history, and previous encounter notes.  I have reviewed the above documentation for accuracy and completeness, and I agree with the above. -  Hlee Fringer d. Lam Bjorklund, NP-C

## 2023-06-26 ENCOUNTER — Inpatient Hospital Stay: Payer: BC Managed Care – PPO | Attending: Hematology and Oncology

## 2023-06-26 VITALS — BP 131/85 | HR 72 | Temp 97.9°F | Resp 17

## 2023-06-26 DIAGNOSIS — E538 Deficiency of other specified B group vitamins: Secondary | ICD-10-CM | POA: Diagnosis not present

## 2023-06-26 DIAGNOSIS — K9189 Other postprocedural complications and disorders of digestive system: Secondary | ICD-10-CM

## 2023-06-26 MED ORDER — CYANOCOBALAMIN 1000 MCG/ML IJ SOLN
1000.0000 ug | Freq: Once | INTRAMUSCULAR | Status: AC
  Administered 2023-06-26: 1000 ug via INTRAMUSCULAR
  Filled 2023-06-26: qty 1

## 2023-07-18 ENCOUNTER — Encounter (HOSPITAL_COMMUNITY): Payer: Self-pay | Admitting: *Deleted

## 2023-07-24 ENCOUNTER — Ambulatory Visit (INDEPENDENT_AMBULATORY_CARE_PROVIDER_SITE_OTHER): Payer: BC Managed Care – PPO | Admitting: Adult Health

## 2023-07-24 ENCOUNTER — Inpatient Hospital Stay: Payer: BC Managed Care – PPO | Attending: Hematology and Oncology

## 2023-07-24 ENCOUNTER — Encounter (INDEPENDENT_AMBULATORY_CARE_PROVIDER_SITE_OTHER): Payer: Self-pay | Admitting: Adult Health

## 2023-07-24 DIAGNOSIS — E559 Vitamin D deficiency, unspecified: Secondary | ICD-10-CM

## 2023-07-24 DIAGNOSIS — D508 Other iron deficiency anemias: Secondary | ICD-10-CM | POA: Diagnosis not present

## 2023-07-24 DIAGNOSIS — E669 Obesity, unspecified: Secondary | ICD-10-CM | POA: Diagnosis not present

## 2023-07-24 DIAGNOSIS — K912 Postsurgical malabsorption, not elsewhere classified: Secondary | ICD-10-CM

## 2023-07-24 DIAGNOSIS — K9189 Other postprocedural complications and disorders of digestive system: Secondary | ICD-10-CM

## 2023-07-24 DIAGNOSIS — E538 Deficiency of other specified B group vitamins: Secondary | ICD-10-CM | POA: Insufficient documentation

## 2023-07-24 DIAGNOSIS — Z903 Acquired absence of stomach [part of]: Secondary | ICD-10-CM

## 2023-07-24 DIAGNOSIS — Z6839 Body mass index (BMI) 39.0-39.9, adult: Secondary | ICD-10-CM

## 2023-07-24 MED ORDER — ZEPBOUND 15 MG/0.5ML ~~LOC~~ SOAJ: 15.0000 mg | SUBCUTANEOUS | 0 refills | Status: DC

## 2023-07-24 MED ORDER — VITAMIN D (ERGOCALCIFEROL) 1.25 MG (50000 UNIT) PO CAPS
50000.0000 [IU] | ORAL_CAPSULE | ORAL | 0 refills | Status: DC
Start: 2023-07-26 — End: 2023-08-22

## 2023-07-24 MED ORDER — CYANOCOBALAMIN 1000 MCG/ML IJ SOLN
1000.0000 ug | Freq: Once | INTRAMUSCULAR | Status: AC
  Administered 2023-07-24: 1000 ug via INTRAMUSCULAR
  Filled 2023-07-24: qty 1

## 2023-07-24 NOTE — Progress Notes (Signed)
Patient presents today for B12 injection. Patient tolerated injection in rt deltoid well with no complaints voiced.  Site clean and dry with no bruising or swelling noted.  No complaints of pain.  Discharged with vital signs stable and no signs or symptoms of distress noted.

## 2023-07-24 NOTE — Progress Notes (Signed)
WEIGHT SUMMARY AND BIOMETRICS  Vitals Temp: 97.9 F (36.6 C) BP: 122/76 Pulse Rate: 73 SpO2: 98 %   Anthropometric Measurements Height: 5\' 11"  (1.803 m) Weight: 280 lb (127 kg) BMI (Calculated): 39.07 Weight at Last Visit: 278lb Weight Lost Since Last Visit: 0 Weight Gained Since Last Visit: 2lb Starting Weight: 312lb Total Weight Loss (lbs): 32 lb (14.5 kg) Peak Weight: 410lb   Body Composition  Body Fat %: 46.2 % Fat Mass (lbs): 129.4 lbs Muscle Mass (lbs): 143.2 lbs Total Body Water (lbs): 104.2 lbs Visceral Fat Rating : 12   Other Clinical Data Fasting: no Labs: no Today's Visit #: 12 Starting Date: 07/04/22    Chief Complaint:   OBESITY Carrie Shaw is here to discuss her progress with her obesity treatment plan. She is on the the Category 2 Plan and states she is following her eating plan approximately 75 % of the time. She states she is exercising Walking and House Work 30 minutes 4-5 times per week.   Interim History:  Reviewed Bioimpedance results with pt: Muscle Mass: -1 lbs Adipose Mass: +2.8 lbs  She endorses increased cravings and consuming more "junk", ie: chips and sweats  She would like to increase Zepbound dose- explained that she in on max dose at 15mg  once weekly injection  Stress- she works a FT position at [employer]  She owns/operates a small business- bakery She is mother to 3 children, ages 15, 50, and 10 months  Sleep- she estimates to sleep 6 hrs/night- reports daytime fatigue Exercise-walking 30 mins 4-5 x week  Subjective:   1. Intestinal malabsorption following gastrectomy Carrie Shaw is status post gastric bypass in 2019.  She went from 410 lbs to 275 lbs in 12 month  Current weight 240 lbs  She recently started Pre Natal multivitamin once daily  2. Iron deficiency anemia secondary to inadequate dietary iron intake Carrie Shaw is status post gastric bypass in 2019.  She went from 410 lbs to 275 lbs in 12 month   Current weight 240 lbs  Latest Reference Range & Units 09/13/22 10:27 09/16/22 11:47 09/17/22 00:58 09/18/22 02:15 09/19/22 03:21 11/14/22 10:54 05/01/23 11:03  Hemoglobin 12.0 - 15.0 g/dL 9.8 (L) 08.6 (L) 57.8 (L) 11.3 (L) 10.5 (L) 12.3 12.4  HCT 36.0 - 46.0 % 32.0 (L) 32.7 (L) 34.1 (L) 34.9 (L) 32.1 (L) 37.7 37.7  (L): Data is abnormally low  06/26/2023 - she received  Iron Sucrose (Venofer) 100-200 mg & Vitamin B12 monthly  Treatment 10 Cyanocobalamin 1000 mcg injection  She recently started Pre Natal multivitamin once daily  Condition stable!  3. Vit D Def  Latest Reference Range & Units 01/25/23 14:31 04/03/23 12:20  Vitamin D, 25-Hydroxy 30.0 - 100.0 ng/mL 26.0 (L) 25.8 (L)  (L): Data is abnormally low  She is on twice weekly Ergocalciferol and daily Pre Natal multivitamin  Assessment/Plan:   1. Intestinal malabsorption following gastrectomy Continue Cat 2 meal plan and supplements  2. Iron deficiency anemia secondary to inadequate dietary iron intake Continue Cat 2 meal plan and supplements Continue monthly injections  3. Vit D Def Refill  Vitamin D, Ergocalciferol, (DRISDOL) 1.25 MG (50000 UNIT) CAPS capsule (Future) Take 1 capsule (50,000 Units total) by mouth 2 (two) times a week. Dispense: 24 capsule, Refills: 0 ordered   4. Obesity,current BMI 39.07 Refill  tirzepatide (ZEPBOUND) 15 MG/0.5ML Pen Inject 15 mg into the skin once a week. Dispense: 2 mL, Refills: 0 ordered   Carrie Shaw is currently in  the action stage of change. As such, her goal is to continue with weight loss efforts. She has agreed to the Category 2 Plan.   Exercise goals: For substantial health benefits, adults should do at least 150 minutes (2 hours and 30 minutes) a week of moderate-intensity, or 75 minutes (1 hour and 15 minutes) a week of vigorous-intensity aerobic physical activity, or an equivalent combination of moderate- and vigorous-intensity aerobic activity. Aerobic activity should be  performed in episodes of at least 10 minutes, and preferably, it should be spread throughout the week.  Behavioral modification strategies: increasing lean protein intake, decreasing simple carbohydrates, increasing vegetables, increasing water intake, no skipping meals, meal planning and cooking strategies, keeping healthy foods in the home, ways to avoid boredom eating, and avoiding temptations.  Carrie Shaw has agreed to follow-up with our clinic in 4 weeks. She was informed of the importance of frequent follow-up visits to maximize her success with intensive lifestyle modifications for her multiple health conditions.   Objective:   Blood pressure 122/76, pulse 73, temperature 97.9 F (36.6 C), height 5\' 11"  (1.803 m), weight 280 lb (127 kg), SpO2 98%, unknown if currently breastfeeding. Body mass index is 39.05 kg/m.  General: Cooperative, alert, well developed, in no acute distress. HEENT: Conjunctivae and lids unremarkable. Cardiovascular: Regular rhythm.  Lungs: Normal work of breathing. Neurologic: No focal deficits.   Lab Results  Component Value Date   CREATININE 0.70 04/03/2023   BUN 15 04/03/2023   NA 138 04/03/2023   K 5.4 (H) 04/03/2023   CL 104 04/03/2023   CO2 19 (L) 04/03/2023   Lab Results  Component Value Date   ALT 34 (H) 04/03/2023   AST 19 04/03/2023   ALKPHOS 68 04/03/2023   BILITOT 0.4 04/03/2023   Lab Results  Component Value Date   HGBA1C 5.5 01/25/2023   HGBA1C 5.0 07/04/2022   HGBA1C 4.9 01/06/2020   Lab Results  Component Value Date   INSULIN 8.1 07/04/2022   INSULIN 7.8 01/06/2020   Lab Results  Component Value Date   TSH 1.180 04/03/2023   Lab Results  Component Value Date   CHOL 195 07/04/2022   HDL 59 07/04/2022   LDLCALC 119 (H) 07/04/2022   TRIG 96 07/04/2022   CHOLHDL 3.4 12/25/2014   Lab Results  Component Value Date   VD25OH 25.8 (L) 04/03/2023   VD25OH 26.0 (L) 01/25/2023   VD25OH 13.2 (L) 07/04/2022   Lab Results   Component Value Date   WBC 7.3 05/01/2023   HGB 12.4 05/01/2023   HCT 37.7 05/01/2023   MCV 94.0 05/01/2023   PLT 318 05/01/2023   Lab Results  Component Value Date   IRON 64 04/03/2023   TIBC 314 04/03/2023   FERRITIN 125 04/03/2023    Attestation Statements:   Reviewed by clinician on day of visit: allergies, medications, problem list, medical history, surgical history, family history, social history, and previous encounter notes.  I have reviewed the above documentation for accuracy and completeness, and I agree with the above. -  Frenchie Pribyl d. Shakyra Mattera, NP-C

## 2023-08-03 DIAGNOSIS — I10 Essential (primary) hypertension: Secondary | ICD-10-CM | POA: Diagnosis not present

## 2023-08-21 ENCOUNTER — Inpatient Hospital Stay: Payer: BC Managed Care – PPO | Attending: Adult Health

## 2023-08-21 VITALS — BP 147/90 | HR 76 | Resp 18

## 2023-08-21 DIAGNOSIS — E538 Deficiency of other specified B group vitamins: Secondary | ICD-10-CM | POA: Diagnosis not present

## 2023-08-21 DIAGNOSIS — K9189 Other postprocedural complications and disorders of digestive system: Secondary | ICD-10-CM

## 2023-08-21 MED ORDER — CYANOCOBALAMIN 1000 MCG/ML IJ SOLN
1000.0000 ug | Freq: Once | INTRAMUSCULAR | Status: AC
  Administered 2023-08-21: 1000 ug via INTRAMUSCULAR
  Filled 2023-08-21: qty 1

## 2023-08-21 NOTE — Patient Instructions (Signed)
 Vitamin B12 Injection What is this medication? Vitamin B12 (VAHY tuh min B12) prevents and treats low vitamin B12 levels in your body. It is used in people who do not get enough vitamin B12 from their diet or when their digestive tract does not absorb enough. Vitamin B12 plays an important role in maintaining the health of your nervous system and red blood cells. This medicine may be used for other purposes; ask your health care provider or pharmacist if you have questions. COMMON BRAND NAME(S): B-12 Compliance Kit, B-12 Injection Kit, Cyomin, Dodex, LA-12, Nutri-Twelve, Physicians EZ Use B-12, Primabalt, Vitamin Deficiency Injectable System - B12 What should I tell my care team before I take this medication? They need to know if you have any of these conditions: Kidney disease Leber's disease Megaloblastic anemia An unusual or allergic reaction to cyanocobalamin, cobalt, other medications, foods, dyes, or preservatives Pregnant or trying to get pregnant Breast-feeding How should I use this medication? This medication is injected into a muscle or deeply under the skin. It is usually given in a clinic or care team's office. However, your care team may teach you how to inject yourself. Follow all instructions. Talk to your care team about the use of this medication in children. Special care may be needed. Overdosage: If you think you have taken too much of this medicine contact a poison control center or emergency room at once. NOTE: This medicine is only for you. Do not share this medicine with others. What if I miss a dose? If you are given your dose at a clinic or care team's office, call to reschedule your appointment. If you give your own injections, and you miss a dose, take it as soon as you can. If it is almost time for your next dose, take only that dose. Do not take double or extra doses. What may interact with this medication? Alcohol Colchicine This list may not describe all possible  interactions. Give your health care provider a list of all the medicines, herbs, non-prescription drugs, or dietary supplements you use. Also tell them if you smoke, drink alcohol, or use illegal drugs. Some items may interact with your medicine. What should I watch for while using this medication? Visit your care team regularly. You may need blood work done while you are taking this medication. You may need to follow a special diet. Talk to your care team. Limit your alcohol intake and avoid smoking to get the best benefit. What side effects may I notice from receiving this medication? Side effects that you should report to your care team as soon as possible: Allergic reactions--skin rash, itching, hives, swelling of the face, lips, tongue, or throat Swelling of the ankles, hands, or feet Trouble breathing Side effects that usually do not require medical attention (report to your care team if they continue or are bothersome): Diarrhea This list may not describe all possible side effects. Call your doctor for medical advice about side effects. You may report side effects to FDA at 1-800-FDA-1088. Where should I keep my medication? Keep out of the reach of children. Store at room temperature between 15 and 30 degrees C (59 and 85 degrees F). Protect from light. Throw away any unused medication after the expiration date. NOTE: This sheet is a summary. It may not cover all possible information. If you have questions about this medicine, talk to your doctor, pharmacist, or health care provider.  2024 Elsevier/Gold Standard (2021-05-31 00:00:00)

## 2023-08-22 ENCOUNTER — Ambulatory Visit (INDEPENDENT_AMBULATORY_CARE_PROVIDER_SITE_OTHER): Payer: BC Managed Care – PPO | Admitting: Adult Health

## 2023-08-22 ENCOUNTER — Encounter (INDEPENDENT_AMBULATORY_CARE_PROVIDER_SITE_OTHER): Payer: Self-pay | Admitting: Adult Health

## 2023-08-22 DIAGNOSIS — I10 Essential (primary) hypertension: Secondary | ICD-10-CM

## 2023-08-22 DIAGNOSIS — D508 Other iron deficiency anemias: Secondary | ICD-10-CM

## 2023-08-22 DIAGNOSIS — E669 Obesity, unspecified: Secondary | ICD-10-CM

## 2023-08-22 DIAGNOSIS — E559 Vitamin D deficiency, unspecified: Secondary | ICD-10-CM | POA: Diagnosis not present

## 2023-08-22 DIAGNOSIS — Z6838 Body mass index (BMI) 38.0-38.9, adult: Secondary | ICD-10-CM

## 2023-08-22 DIAGNOSIS — E66812 Obesity, class 2: Secondary | ICD-10-CM

## 2023-08-22 MED ORDER — VITAMIN D (ERGOCALCIFEROL) 1.25 MG (50000 UNIT) PO CAPS: 50000.0000 [IU] | ORAL_CAPSULE | ORAL | 0 refills | Status: DC

## 2023-08-22 MED ORDER — ZEPBOUND 15 MG/0.5ML ~~LOC~~ SOAJ: 15.0000 mg | SUBCUTANEOUS | 0 refills | Status: DC

## 2023-08-22 NOTE — Progress Notes (Signed)
WEIGHT SUMMARY AND BIOMETRICS  Vitals Temp: (!) 97.5 F (36.4 C) BP: 110/76 Pulse Rate: 76 SpO2: 99 %   Anthropometric Measurements Height: 5\' 11"  (1.803 m) Weight: 278 lb (126.1 kg) BMI (Calculated): 38.79 Weight at Last Visit: 280lb Weight Lost Since Last Visit: 2lb Weight Gained Since Last Visit: 0 Starting Weight: 312lb Total Weight Loss (lbs): 34 lb (15.4 kg) Peak Weight: 410lb   Body Composition  Body Fat %: 45.6 % Fat Mass (lbs): 126.8 lbs Muscle Mass (lbs): 143.6 lbs Total Body Water (lbs): 101 lbs Visceral Fat Rating : 11   Other Clinical Data Fasting: no Labs: no Today's Visit #: 13 Starting Date: 07/04/22    Chief Complaint:   OBESITY Carrie Shaw is here to discuss her progress with her obesity treatment plan. She is on the the Category 2 Plan and states she is following her eating plan approximately 25 % of the time. She states she is not currently exercising- Chronic L Hip pain- worsening over last 8 months.   Interim History:  She has been on max dose Zepbound 15mg  since on/about 05/29/2023 She had a brief 2 week hiatus of Zepbound due to [employer] She reports profound polyphagia when off GLP-I/GIP therapy. She has been back on weekly injections for > 1 month  [name] provided the following foor recall typical of a day: Breakfast: Protein Shake, 3 eggs, either oatmeal or grits Lunch: [employer] Chicken soup mixed with Bariwise soup (15g protein) Dinner: Protein serving with "greens".  Reviewed Bioimpedance results with pt: Muscle Mass: +0.4 lb Adipose Mass: -2.6 lbs  Chronic L Hip pain- worsening over last 8 months. She denies acute injury/accident prior to onset of pain. She has not been evaluated for condition. Last visit with her [facility] in 2021- lost to f/u  Provided list of local [place] PCP Providers  Subjective:   1. Iron deficiency anemia secondary to inadequate dietary iron intake She  has received IM B12 injections on 10/22 and 11/19 She endorses worsening fatigue since Oct 2024  2. Essential hypertension BP at goal at OV She started antihypertensive therapy during her third and final pregnancy. She is currently on daily Lasix 40mg  and daily Labetalol 200mg    3. Vit D Def  Latest Reference Range & Units 01/25/23 14:31 04/03/23 12:20  Vitamin D, 25-Hydroxy 30.0 - 100.0 ng/mL 26.0 (L) 25.8 (L)  (L): Data is abnormally low   She is on bi-weekly Ergocalciferol- denies N/V/Muscle Weakness  Assessment/Plan:   1. Iron deficiency anemia secondary to inadequate dietary iron intake Check Labs at next OV  2. Essential hypertension Check Labs at next OV  3. Vit D Def Refill Vitamin D, Ergocalciferol, (DRISDOL) 1.25 MG (50000 UNIT) CAPS capsule (Future) Take 1 capsule (50,000 Units total) by mouth 2 (two) times a week. Dispense: 24 capsule, Refills: 0 ordered   Check Labs at next OV  4. Obesity,current BMI 38.79 Refill  [name] is not currently in the action stage of change. As such, her goal is to get back to weightloss efforts . She has agreed to the Category 2 Plan.   Exercise goals: All adults should avoid inactivity. Some physical activity is better than none, and adults who participate in any amount of physical activity gain some health benefits. Adults should also include muscle-strengthening activities that involve all major muscle groups on 2 or more days a week.  Behavioral modification strategies: increasing lean protein intake, decreasing simple carbohydrates, increasing vegetables, increasing  water intake, decreasing eating out, no skipping meals, meal planning and cooking strategies, keeping healthy foods in the home, and planning for success.  Mariposa has agreed to follow-up with our clinic in 4 weeks. She was informed of the importance of frequent follow-up visits to maximize her success with intensive lifestyle modifications for her multiple health  conditions.   Check Fasting Labs at next OV  Objective:   Blood pressure 110/76, pulse 76, temperature (!) 97.5 F (36.4 C), height 5\' 11"  (1.803 m), weight 278 lb (126.1 kg), last menstrual period 08/10/2023, SpO2 99%, unknown if currently breastfeeding. Body mass index is 38.77 kg/m.  General: Cooperative, alert, well developed, in no acute distress. HEENT: Conjunctivae and lids unremarkable. Cardiovascular: Regular rhythm.  Lungs: Normal work of breathing. Neurologic: No focal deficits.   Lab Results  Component Value Date   CREATININE 0.70 04/03/2023   BUN 15 04/03/2023   NA 138 04/03/2023   K 5.4 (H) 04/03/2023   CL 104 04/03/2023   CO2 19 (L) 04/03/2023   Lab Results  Component Value Date   ALT 34 (H) 04/03/2023   AST 19 04/03/2023   ALKPHOS 68 04/03/2023   BILITOT 0.4 04/03/2023   Lab Results  Component Value Date   HGBA1C 5.5 01/25/2023   HGBA1C 5.0 07/04/2022   HGBA1C 4.9 01/06/2020   Lab Results  Component Value Date   INSULIN 8.1 07/04/2022   INSULIN 7.8 01/06/2020   Lab Results  Component Value Date   TSH 1.180 04/03/2023   Lab Results  Component Value Date   CHOL 195 07/04/2022   HDL 59 07/04/2022   LDLCALC 119 (H) 07/04/2022   TRIG 96 07/04/2022   CHOLHDL 3.4 12/25/2014   Lab Results  Component Value Date   VD25OH 25.8 (L) 04/03/2023   VD25OH 26.0 (L) 01/25/2023   VD25OH 13.2 (L) 07/04/2022   Lab Results  Component Value Date   WBC 7.3 05/01/2023   HGB 12.4 05/01/2023   HCT 37.7 05/01/2023   MCV 94.0 05/01/2023   PLT 318 05/01/2023   Lab Results  Component Value Date   IRON 64 04/03/2023   TIBC 314 04/03/2023   FERRITIN 125 04/03/2023    Attestation Statements:   Reviewed by clinician on day of visit: allergies, medications, problem list, medical history, surgical history, family history, social history, and previous encounter notes.  I have reviewed the above documentation for accuracy and completeness, and I agree with  the above. -  Dalonte Hardage d. Devin Ganaway, NP-C

## 2023-09-02 DIAGNOSIS — I10 Essential (primary) hypertension: Secondary | ICD-10-CM | POA: Diagnosis not present

## 2023-09-13 ENCOUNTER — Telehealth (INDEPENDENT_AMBULATORY_CARE_PROVIDER_SITE_OTHER): Payer: Self-pay | Admitting: Adult Health

## 2023-09-13 NOTE — Telephone Encounter (Signed)
Pt is calling in stating that she would be sending over some FMLA pw for Katy to fill out and would like to see if they can be ready before the end of the year.

## 2023-09-18 ENCOUNTER — Inpatient Hospital Stay: Payer: BC Managed Care – PPO | Attending: Hematology and Oncology

## 2023-09-18 ENCOUNTER — Encounter (INDEPENDENT_AMBULATORY_CARE_PROVIDER_SITE_OTHER): Payer: Self-pay | Admitting: Adult Health

## 2023-09-18 VITALS — BP 138/72 | HR 77 | Temp 98.0°F | Resp 18

## 2023-09-18 DIAGNOSIS — E538 Deficiency of other specified B group vitamins: Secondary | ICD-10-CM | POA: Diagnosis not present

## 2023-09-18 DIAGNOSIS — D508 Other iron deficiency anemias: Secondary | ICD-10-CM

## 2023-09-18 MED ORDER — CYANOCOBALAMIN 1000 MCG/ML IJ SOLN
1000.0000 ug | Freq: Once | INTRAMUSCULAR | Status: AC
  Administered 2023-09-18: 1000 ug via INTRAMUSCULAR
  Filled 2023-09-18: qty 1

## 2023-09-19 ENCOUNTER — Encounter (INDEPENDENT_AMBULATORY_CARE_PROVIDER_SITE_OTHER): Payer: Self-pay | Admitting: Adult Health

## 2023-09-19 ENCOUNTER — Telehealth: Payer: Self-pay

## 2023-09-19 NOTE — Telephone Encounter (Signed)
Left voicemail notifying patient that her Carrie Shaw documents had been completed and faxed back to company. Fax confirmation received. Copy of documents mailed to patient per request.

## 2023-09-21 DIAGNOSIS — E669 Obesity, unspecified: Secondary | ICD-10-CM | POA: Diagnosis not present

## 2023-09-21 DIAGNOSIS — Z9884 Bariatric surgery status: Secondary | ICD-10-CM | POA: Diagnosis not present

## 2023-09-21 DIAGNOSIS — E559 Vitamin D deficiency, unspecified: Secondary | ICD-10-CM | POA: Diagnosis not present

## 2023-09-24 ENCOUNTER — Ambulatory Visit (INDEPENDENT_AMBULATORY_CARE_PROVIDER_SITE_OTHER): Payer: BC Managed Care – PPO | Admitting: Adult Health

## 2023-10-03 DIAGNOSIS — I10 Essential (primary) hypertension: Secondary | ICD-10-CM | POA: Diagnosis not present

## 2023-10-16 ENCOUNTER — Inpatient Hospital Stay: Payer: BC Managed Care – PPO

## 2023-10-16 ENCOUNTER — Other Ambulatory Visit: Payer: Self-pay | Admitting: Adult Health

## 2023-10-16 ENCOUNTER — Inpatient Hospital Stay: Payer: BC Managed Care – PPO | Attending: Hematology and Oncology

## 2023-10-16 VITALS — BP 141/85 | HR 77 | Temp 98.0°F | Resp 18

## 2023-10-16 DIAGNOSIS — E538 Deficiency of other specified B group vitamins: Secondary | ICD-10-CM | POA: Diagnosis not present

## 2023-10-16 DIAGNOSIS — D508 Other iron deficiency anemias: Secondary | ICD-10-CM

## 2023-10-16 LAB — CMP (CANCER CENTER ONLY)
ALT: 20 U/L (ref 0–44)
AST: 14 U/L — ABNORMAL LOW (ref 15–41)
Albumin: 3.9 g/dL (ref 3.5–5.0)
Alkaline Phosphatase: 47 U/L (ref 38–126)
Anion gap: 3 — ABNORMAL LOW (ref 5–15)
BUN: 11 mg/dL (ref 6–20)
CO2: 25 mmol/L (ref 22–32)
Calcium: 9 mg/dL (ref 8.9–10.3)
Chloride: 109 mmol/L (ref 98–111)
Creatinine: 0.79 mg/dL (ref 0.44–1.00)
GFR, Estimated: >60 mL/min (ref >60)
Glucose, Bld: 79 mg/dL (ref 70–99)
Potassium: 4.3 mmol/L (ref 3.5–5.1)
Sodium: 137 mmol/L (ref 135–145)
Total Bilirubin: 0.2 mg/dL (ref 0.0–1.2)
Total Protein: 7 g/dL (ref 6.5–8.1)

## 2023-10-16 LAB — CBC WITH DIFFERENTIAL (CANCER CENTER ONLY)
Abs Immature Granulocytes: 0.01 10*3/uL (ref 0.00–0.07)
Basophils Absolute: 0 10*3/uL (ref 0.0–0.1)
Basophils Relative: 0 %
Eosinophils Absolute: 0.2 10*3/uL (ref 0.0–0.5)
Eosinophils Relative: 2 %
HCT: 36.7 % (ref 36.0–46.0)
Hemoglobin: 12.2 g/dL (ref 12.0–15.0)
Immature Granulocytes: 0 %
Lymphocytes Relative: 30 %
Lymphs Abs: 2.2 10*3/uL (ref 0.7–4.0)
MCH: 30.3 pg (ref 26.0–34.0)
MCHC: 33.2 g/dL (ref 30.0–36.0)
MCV: 91.1 fL (ref 80.0–100.0)
Monocytes Absolute: 0.5 10*3/uL (ref 0.1–1.0)
Monocytes Relative: 7 %
Neutro Abs: 4.4 10*3/uL (ref 1.7–7.7)
Neutrophils Relative %: 61 %
Platelet Count: 343 10*3/uL (ref 150–400)
RBC: 4.03 MIL/uL (ref 3.87–5.11)
RDW: 14 % (ref 11.5–15.5)
WBC Count: 7.3 10*3/uL (ref 4.0–10.5)
nRBC: 0 % (ref 0.0–0.2)

## 2023-10-16 LAB — IRON AND IRON BINDING CAPACITY (CC-WL,HP ONLY)
Iron: 44 ug/dL (ref 28–170)
Saturation Ratios: 15 % (ref 10.4–31.8)
TIBC: 301 ug/dL (ref 250–450)
UIBC: 257 ug/dL (ref 148–442)

## 2023-10-16 LAB — VITAMIN B12: Vitamin B-12: 150 pg/mL — ABNORMAL LOW (ref 180–914)

## 2023-10-16 MED ORDER — CYANOCOBALAMIN 1000 MCG/ML IJ SOLN
1000.0000 ug | Freq: Once | INTRAMUSCULAR | Status: AC
  Administered 2023-10-16: 1000 ug via INTRAMUSCULAR
  Filled 2023-10-16: qty 1

## 2023-10-17 ENCOUNTER — Telehealth (INDEPENDENT_AMBULATORY_CARE_PROVIDER_SITE_OTHER): Payer: BC Managed Care – PPO | Admitting: Adult Health

## 2023-10-17 VITALS — Ht 71.0 in

## 2023-10-17 DIAGNOSIS — Z6838 Body mass index (BMI) 38.0-38.9, adult: Secondary | ICD-10-CM

## 2023-10-17 DIAGNOSIS — E559 Vitamin D deficiency, unspecified: Secondary | ICD-10-CM

## 2023-10-17 DIAGNOSIS — Z903 Acquired absence of stomach [part of]: Secondary | ICD-10-CM

## 2023-10-17 DIAGNOSIS — K912 Postsurgical malabsorption, not elsewhere classified: Secondary | ICD-10-CM | POA: Diagnosis not present

## 2023-10-17 DIAGNOSIS — E669 Obesity, unspecified: Secondary | ICD-10-CM

## 2023-10-17 DIAGNOSIS — R6889 Other general symptoms and signs: Secondary | ICD-10-CM

## 2023-10-17 LAB — FERRITIN: Ferritin: 31 ng/mL (ref 11–307)

## 2023-10-17 MED ORDER — PRENATAL VITAMINS 27-0.8 MG PO TABS: 1.0000 | ORAL_TABLET | Freq: Every day | ORAL | 0 refills | Status: DC

## 2023-10-17 MED ORDER — ZEPBOUND 15 MG/0.5ML ~~LOC~~ SOAJ: 15.0000 mg | SUBCUTANEOUS | 0 refills | Status: DC

## 2023-10-17 MED ORDER — VITAMIN D (ERGOCALCIFEROL) 1.25 MG (50000 UNIT) PO CAPS: 50000.0000 [IU] | ORAL_CAPSULE | ORAL | 0 refills | Status: DC

## 2023-10-17 NOTE — Progress Notes (Signed)
 WEIGHT SUMMARY AND BIOMETRICS  Vitals Temp: 0 F (-17.8 C) (Video Visit) BP: -- (Video Visit) Pulse Rate: 0 (Video Visit) SpO2: 0 % (Video Visit)   Anthropometric Measurements Height: 5\' 11"  (1.803 m) Weight:  (0 kg) (Video Visit) Weight at Last Visit: 278lb Weight Lost Since Last Visit: -- (Video Visit) Weight Gained Since Last Visit: -- (Video Visit) Starting Weight: 312lb Total Weight Loss (lbs):  (0 kg) (Video Visit) Peak Weight: 410lb   Body Composition  Body Fat %: 0 % (Video Visit) Fat Mass (lbs): 0 lbs (Video Visit) Muscle Mass (lbs): 0 lbs (Video Visit) Total Body Water  (lbs): 0 lbs (Video Visit) Visceral Fat Rating : 0 (Video Visit)   Other Clinical Data Fasting: no Labs: yes Today's Visit #: 14 Starting Date: 07/04/22 Comments: Video Visit    Chief Complaint:  I connected with  Carrie Shaw on 10/17/23 by a video and audio enabled telemedicine application and verified that I am speaking with the correct person using two identifiers.  Patient Location: Home  Provider Location: Office/Clinic  I discussed the limitations of evaluation and management by telemedicine. The patient expressed understanding and agreed to proceed.   OBESITY Carrie Shaw is here to discuss her progress with her obesity treatment plan. She is on the the Category 2 Plan and states she is following her eating plan approximately UNKNOWN % of the time.  She states she is exercising: None due to acute illness   Interim History:  Her 23 year old daughter tested + for Flu on 10/15/2023 Carrie Shaw reports HA, non productive cough, yellow-clear nasal congestion, and fatigue the last 48 hours. She denies fever, N/V, or dyspnea She has not been tested for flu She has been resting and increasing fluids  OV was converted to MyChart vidoe visit due to her acute illness  Of note- she has labs completed with Oncology on 10/16/2023- reviewed results with pt  Subjective:    1. Flu-like symptoms Her 13 year old daughter tested + for Flu on 10/15/2023 Carrie Shaw reports HA, non productive cough, yellow-clear nasal congestion, and fatigue the last 48 hours. She denies fever, N/V, or dyspnea She has not been tested for flu She has been resting and increasing fluids  2. Intestinal malabsorption following gastrectomy She endorses extreme fatigue, r/t to recent URI She requests refill on Prenatal Vitamins  Of note- Carrie Shaw is absitent. Instructed that she needs to use birth control as she is on Zepbound . Pt verbalized understanding and agreement  3. Vitamin D  deficiency She endorses extreme fatigue, r.t to recent URI She is on weekly Ergocalciferol - denies N/V/Muscle Weakness  Assessment/Plan:   1. Flu-like symptoms Remain well hydrated with fluids Rest OTC remedies If sx's worsen, f/u with PCP or UC If experiencing sx's- wear a mask if you leave the home  2. Intestinal malabsorption following gastrectomy Refill Prenatal Vit-Fe Fumarate-FA (PRENATAL VITAMINS) 27-0.8 MG TABS Take 1 tablet by mouth daily. Dispense: 90 each, Refills: 0 ordered   3. Vitamin D  deficiency Refill - Vitamin D , Ergocalciferol , (DRISDOL ) 1.25 MG (50000 UNIT) CAPS capsule; Take 1 capsule (50,000 Units total) by mouth 2 (two) times a week.  Dispense: 24 capsule; Refill: 0  4. Obesity,current BMI 38.79 (Primary) Refill - tirzepatide  (ZEPBOUND ) 15 MG/0.5ML Pen; Inject 15 mg into the skin once a week.  Dispense: 2 mL; Refill: 0  Carrie Shaw is not currently in the action stage of change. As such, her goal is to maintain weight for now.  She has agreed to the Category 2 Plan.   Exercise goals: No exercise has been prescribed at this time.  Behavioral modification strategies: increasing lean protein intake, decreasing simple carbohydrates, increasing vegetables, increasing water  intake, no skipping meals, meal planning and cooking strategies, keeping healthy foods in the home,  ways to avoid boredom eating, and planning for success.  Carrie Shaw has agreed to follow-up with our clinic in 4 weeks. She was informed of the importance of frequent follow-up visits to maximize her success with intensive lifestyle modifications for her multiple health conditions.  Objective:   Height 5\' 11"  (1.803 m), unknown if currently breastfeeding. Body mass index is 38.77 kg/m.  General: Cooperative, alert, well developed, in no acute distress. HEENT: Conjunctivae and lids unremarkable. Cardiovascular: Regular rhythm.  Lungs: Normal work of breathing. Neurologic: No focal deficits.   Lab Results  Component Value Date   CREATININE 0.79 10/16/2023   BUN 11 10/16/2023   NA 137 10/16/2023   K 4.3 10/16/2023   CL 109 10/16/2023   CO2 25 10/16/2023   Lab Results  Component Value Date   ALT 20 10/16/2023   AST 14 (L) 10/16/2023   ALKPHOS 47 10/16/2023   BILITOT 0.2 10/16/2023   Lab Results  Component Value Date   HGBA1C 5.5 01/25/2023   HGBA1C 5.0 07/04/2022   HGBA1C 4.9 01/06/2020   Lab Results  Component Value Date   INSULIN  8.1 07/04/2022   INSULIN  7.8 01/06/2020   Lab Results  Component Value Date   TSH 1.180 04/03/2023   Lab Results  Component Value Date   CHOL 195 07/04/2022   HDL 59 07/04/2022   LDLCALC 119 (H) 07/04/2022   TRIG 96 07/04/2022   CHOLHDL 3.4 12/25/2014   Lab Results  Component Value Date   VD25OH 25.8 (L) 04/03/2023   VD25OH 26.0 (L) 01/25/2023   VD25OH 13.2 (L) 07/04/2022   Lab Results  Component Value Date   WBC 7.3 10/16/2023   HGB 12.2 10/16/2023   HCT 36.7 10/16/2023   MCV 91.1 10/16/2023   PLT 343 10/16/2023   Lab Results  Component Value Date   IRON  44 10/16/2023   TIBC 301 10/16/2023   FERRITIN 31 10/16/2023    Attestation Statements:   Reviewed by clinician on day of visit: allergies, medications, problem list, medical history, surgical history, family history, social history, and previous encounter  notes.  Time spent on visit including pre-visit chart review and post-visit care and charting was 18 minutes.   I have reviewed the above documentation for accuracy and completeness, and I agree with the above. -  Marine Lezotte d. Merian Wroe, NP-C

## 2023-10-23 ENCOUNTER — Encounter (INDEPENDENT_AMBULATORY_CARE_PROVIDER_SITE_OTHER): Payer: Self-pay | Admitting: Adult Health

## 2023-10-23 ENCOUNTER — Telehealth (INDEPENDENT_AMBULATORY_CARE_PROVIDER_SITE_OTHER): Payer: Self-pay

## 2023-10-23 NOTE — Telephone Encounter (Signed)
PA for Zepbound 15MG  has been submitted, awaiting PA questions.

## 2023-10-23 NOTE — Telephone Encounter (Signed)
PA for Zepbound was not sent through, we got a response saying the patient was inactive. Patient notified

## 2023-11-01 ENCOUNTER — Telehealth: Payer: Self-pay

## 2023-11-01 NOTE — Telephone Encounter (Addendum)
Called pt with message below. Pt verbalized understanding. Pt states that she is still tired and have no energy is it any other recommendation that you can give her alone with the B12.----- Message from Noreene Filbert sent at 10/31/2023  4:39 PM EST ----- Please let Berklie know that we really recommend she get her b12 injection every 4 weeks to keep it at a better level.  It was a little too low when we drew it last time, however she had missed a couple of injections. ----- Message ----- From: Leory Plowman, Lab [facility] [place] Sent: 10/16/2023   2:56 PM EST To: Loa Socks, NP

## 2023-11-06 ENCOUNTER — Encounter: Payer: Self-pay | Admitting: Hematology and Oncology

## 2023-11-08 NOTE — Telephone Encounter (Signed)
 This patient is receiving b12 injection monthly with us  and her b12 level is still low, do you want to have her increase her frequency of b12 injections?

## 2023-11-13 ENCOUNTER — Inpatient Hospital Stay: Payer: Commercial Managed Care - HMO | Attending: Hematology and Oncology

## 2023-11-13 ENCOUNTER — Encounter: Payer: Self-pay | Admitting: Hematology and Oncology

## 2023-11-13 VITALS — BP 136/74 | HR 78 | Temp 98.1°F | Resp 18

## 2023-11-13 DIAGNOSIS — E538 Deficiency of other specified B group vitamins: Secondary | ICD-10-CM | POA: Diagnosis present

## 2023-11-13 DIAGNOSIS — D508 Other iron deficiency anemias: Secondary | ICD-10-CM

## 2023-11-13 MED ORDER — CYANOCOBALAMIN 1000 MCG/ML IJ SOLN
1000.0000 ug | Freq: Once | INTRAMUSCULAR | Status: AC
  Administered 2023-11-13: 1000 ug via INTRAMUSCULAR
  Filled 2023-11-13: qty 1

## 2023-11-16 ENCOUNTER — Other Ambulatory Visit: Payer: Self-pay | Admitting: Adult Health

## 2023-11-16 NOTE — Telephone Encounter (Signed)
Last b12 injection 11/13/2023.  Orders placed for weekly b12.  Will you let patient know about Dr. Remonia Richter recommendation, and the need for weekly b12 x 8 and then set lab and f/u with Dr. Al Pimple after?

## 2023-11-19 ENCOUNTER — Telehealth: Payer: Self-pay | Admitting: Hematology and Oncology

## 2023-11-19 NOTE — Telephone Encounter (Signed)
 Spoke with patient confirming upcoming appointment

## 2023-11-20 ENCOUNTER — Encounter: Payer: Self-pay | Admitting: Hematology and Oncology

## 2023-11-20 ENCOUNTER — Inpatient Hospital Stay: Payer: Commercial Managed Care - HMO

## 2023-11-20 ENCOUNTER — Other Ambulatory Visit: Payer: Self-pay

## 2023-11-20 VITALS — BP 142/86 | HR 74 | Temp 98.8°F | Resp 18

## 2023-11-20 DIAGNOSIS — D508 Other iron deficiency anemias: Secondary | ICD-10-CM

## 2023-11-20 DIAGNOSIS — E538 Deficiency of other specified B group vitamins: Secondary | ICD-10-CM | POA: Diagnosis not present

## 2023-11-20 LAB — CMP (CANCER CENTER ONLY)
ALT: 28 U/L (ref 0–44)
AST: 18 U/L (ref 15–41)
Albumin: 3.9 g/dL (ref 3.5–5.0)
Alkaline Phosphatase: 63 U/L (ref 38–126)
Anion gap: 2 — ABNORMAL LOW (ref 5–15)
BUN: 12 mg/dL (ref 6–20)
CO2: 25 mmol/L (ref 22–32)
Calcium: 8.7 mg/dL — ABNORMAL LOW (ref 8.9–10.3)
Chloride: 110 mmol/L (ref 98–111)
Creatinine: 0.75 mg/dL (ref 0.44–1.00)
GFR, Estimated: >60 mL/min (ref >60)
Glucose, Bld: 79 mg/dL (ref 70–99)
Potassium: 4.6 mmol/L (ref 3.5–5.1)
Sodium: 137 mmol/L (ref 135–145)
Total Bilirubin: 0.3 mg/dL (ref 0.0–1.2)
Total Protein: 7 g/dL (ref 6.5–8.1)

## 2023-11-20 LAB — VITAMIN B12: Vitamin B-12: 205 pg/mL (ref 180–914)

## 2023-11-20 LAB — IRON AND IRON BINDING CAPACITY (CC-WL,HP ONLY)
Iron: 82 ug/dL (ref 28–170)
Saturation Ratios: 23 % (ref 10.4–31.8)
TIBC: 353 ug/dL (ref 250–450)
UIBC: 271 ug/dL (ref 148–442)

## 2023-11-20 LAB — CBC WITH DIFFERENTIAL (CANCER CENTER ONLY)
Abs Immature Granulocytes: 0.02 10*3/uL (ref 0.00–0.07)
Basophils Absolute: 0 10*3/uL (ref 0.0–0.1)
Basophils Relative: 0 %
Eosinophils Absolute: 0.3 10*3/uL (ref 0.0–0.5)
Eosinophils Relative: 4 %
HCT: 38.1 % (ref 36.0–46.0)
Hemoglobin: 12.1 g/dL (ref 12.0–15.0)
Immature Granulocytes: 0 %
Lymphocytes Relative: 29 %
Lymphs Abs: 2.1 10*3/uL (ref 0.7–4.0)
MCH: 30.2 pg (ref 26.0–34.0)
MCHC: 31.8 g/dL (ref 30.0–36.0)
MCV: 95 fL (ref 80.0–100.0)
Monocytes Absolute: 0.5 10*3/uL (ref 0.1–1.0)
Monocytes Relative: 7 %
Neutro Abs: 4.4 10*3/uL (ref 1.7–7.7)
Neutrophils Relative %: 60 %
Platelet Count: 321 10*3/uL (ref 150–400)
RBC: 4.01 MIL/uL (ref 3.87–5.11)
RDW: 14.6 % (ref 11.5–15.5)
WBC Count: 7.4 10*3/uL (ref 4.0–10.5)
nRBC: 0 % (ref 0.0–0.2)

## 2023-11-20 MED ORDER — CYANOCOBALAMIN 1000 MCG/ML IJ SOLN
1000.0000 ug | Freq: Once | INTRAMUSCULAR | Status: AC
  Administered 2023-11-20: 1000 ug via INTRAMUSCULAR
  Filled 2023-11-20: qty 1

## 2023-11-20 NOTE — Progress Notes (Signed)
Pt. Here for injection.  Wants to drop of FMLA papers.  Called [facility] and states pt. Ok to come to her office to drop of papers.  Pt. Made aware

## 2023-11-21 ENCOUNTER — Encounter: Payer: Self-pay | Admitting: Adult Health

## 2023-11-21 LAB — FERRITIN: Ferritin: 25 ng/mL (ref 11–307)

## 2023-11-27 ENCOUNTER — Other Ambulatory Visit: Payer: Self-pay | Admitting: *Deleted

## 2023-11-27 ENCOUNTER — Inpatient Hospital Stay: Payer: Commercial Managed Care - HMO

## 2023-11-27 VITALS — BP 157/97 | HR 71 | Temp 98.0°F | Resp 17

## 2023-11-27 DIAGNOSIS — E538 Deficiency of other specified B group vitamins: Secondary | ICD-10-CM | POA: Diagnosis not present

## 2023-11-27 DIAGNOSIS — D508 Other iron deficiency anemias: Secondary | ICD-10-CM

## 2023-11-27 MED ORDER — CYANOCOBALAMIN 1000 MCG/ML IJ SOLN
1000.0000 ug | Freq: Once | INTRAMUSCULAR | Status: AC
  Administered 2023-11-27: 1000 ug via INTRAMUSCULAR
  Filled 2023-11-27: qty 1

## 2023-11-28 ENCOUNTER — Telehealth: Payer: Self-pay

## 2023-11-28 NOTE — Telephone Encounter (Signed)
 Notified the pt that her document have been faxed to [facility] disability,as well as her copies were also emailed to her as requested. 11/28/2023

## 2023-12-03 ENCOUNTER — Other Ambulatory Visit: Payer: Self-pay | Admitting: *Deleted

## 2023-12-03 DIAGNOSIS — E538 Deficiency of other specified B group vitamins: Secondary | ICD-10-CM

## 2023-12-04 ENCOUNTER — Inpatient Hospital Stay: Payer: Commercial Managed Care - HMO

## 2023-12-04 ENCOUNTER — Inpatient Hospital Stay: Payer: Commercial Managed Care - HMO | Attending: Hematology and Oncology

## 2023-12-04 DIAGNOSIS — Z87891 Personal history of nicotine dependence: Secondary | ICD-10-CM | POA: Diagnosis not present

## 2023-12-04 DIAGNOSIS — R5383 Other fatigue: Secondary | ICD-10-CM | POA: Diagnosis not present

## 2023-12-04 DIAGNOSIS — K909 Intestinal malabsorption, unspecified: Secondary | ICD-10-CM | POA: Diagnosis not present

## 2023-12-04 DIAGNOSIS — Z9884 Bariatric surgery status: Secondary | ICD-10-CM | POA: Diagnosis not present

## 2023-12-04 DIAGNOSIS — K9189 Other postprocedural complications and disorders of digestive system: Secondary | ICD-10-CM

## 2023-12-04 DIAGNOSIS — E538 Deficiency of other specified B group vitamins: Secondary | ICD-10-CM | POA: Diagnosis present

## 2023-12-04 DIAGNOSIS — D508 Other iron deficiency anemias: Secondary | ICD-10-CM | POA: Diagnosis not present

## 2023-12-04 LAB — CBC WITH DIFFERENTIAL (CANCER CENTER ONLY)
Abs Immature Granulocytes: 0.02 10*3/uL (ref 0.00–0.07)
Basophils Absolute: 0 10*3/uL (ref 0.0–0.1)
Basophils Relative: 0 %
Eosinophils Absolute: 0.3 10*3/uL (ref 0.0–0.5)
Eosinophils Relative: 4 %
HCT: 39.1 % (ref 36.0–46.0)
Hemoglobin: 12.4 g/dL (ref 12.0–15.0)
Immature Granulocytes: 0 %
Lymphocytes Relative: 31 %
Lymphs Abs: 2.3 10*3/uL (ref 0.7–4.0)
MCH: 29.9 pg (ref 26.0–34.0)
MCHC: 31.7 g/dL (ref 30.0–36.0)
MCV: 94.2 fL (ref 80.0–100.0)
Monocytes Absolute: 0.5 10*3/uL (ref 0.1–1.0)
Monocytes Relative: 7 %
Neutro Abs: 4.3 10*3/uL (ref 1.7–7.7)
Neutrophils Relative %: 58 %
Platelet Count: 338 10*3/uL (ref 150–400)
RBC: 4.15 MIL/uL (ref 3.87–5.11)
RDW: 14.3 % (ref 11.5–15.5)
WBC Count: 7.4 10*3/uL (ref 4.0–10.5)
nRBC: 0 % (ref 0.0–0.2)

## 2023-12-04 MED ORDER — CYANOCOBALAMIN 1000 MCG/ML IJ SOLN
1000.0000 ug | Freq: Once | INTRAMUSCULAR | Status: AC
  Administered 2023-12-04: 1000 ug via INTRAMUSCULAR
  Filled 2023-12-04: qty 1

## 2023-12-10 ENCOUNTER — Other Ambulatory Visit: Payer: Self-pay | Admitting: *Deleted

## 2023-12-10 DIAGNOSIS — D508 Other iron deficiency anemias: Secondary | ICD-10-CM

## 2023-12-10 DIAGNOSIS — D509 Iron deficiency anemia, unspecified: Secondary | ICD-10-CM

## 2023-12-11 ENCOUNTER — Encounter: Payer: Self-pay | Admitting: Hematology and Oncology

## 2023-12-11 ENCOUNTER — Other Ambulatory Visit (INDEPENDENT_AMBULATORY_CARE_PROVIDER_SITE_OTHER): Payer: Self-pay | Admitting: Adult Health

## 2023-12-11 ENCOUNTER — Inpatient Hospital Stay: Payer: Commercial Managed Care - HMO

## 2023-12-11 ENCOUNTER — Encounter (INDEPENDENT_AMBULATORY_CARE_PROVIDER_SITE_OTHER): Payer: Self-pay

## 2023-12-11 DIAGNOSIS — D509 Iron deficiency anemia, unspecified: Secondary | ICD-10-CM

## 2023-12-11 DIAGNOSIS — D508 Other iron deficiency anemias: Secondary | ICD-10-CM

## 2023-12-11 DIAGNOSIS — E538 Deficiency of other specified B group vitamins: Secondary | ICD-10-CM | POA: Diagnosis not present

## 2023-12-11 LAB — CBC WITH DIFFERENTIAL (CANCER CENTER ONLY)
Abs Immature Granulocytes: 0.03 10*3/uL (ref 0.00–0.07)
Basophils Absolute: 0 10*3/uL (ref 0.0–0.1)
Basophils Relative: 0 %
Eosinophils Absolute: 0.2 10*3/uL (ref 0.0–0.5)
Eosinophils Relative: 2 %
HCT: 36 % (ref 36.0–46.0)
Hemoglobin: 11.4 g/dL — ABNORMAL LOW (ref 12.0–15.0)
Immature Granulocytes: 0 %
Lymphocytes Relative: 34 %
Lymphs Abs: 2.6 10*3/uL (ref 0.7–4.0)
MCH: 29.2 pg (ref 26.0–34.0)
MCHC: 31.7 g/dL (ref 30.0–36.0)
MCV: 92.1 fL (ref 80.0–100.0)
Monocytes Absolute: 0.5 10*3/uL (ref 0.1–1.0)
Monocytes Relative: 7 %
Neutro Abs: 4.2 10*3/uL (ref 1.7–7.7)
Neutrophils Relative %: 57 %
Platelet Count: 309 10*3/uL (ref 150–400)
RBC: 3.91 MIL/uL (ref 3.87–5.11)
RDW: 14.5 % (ref 11.5–15.5)
WBC Count: 7.5 10*3/uL (ref 4.0–10.5)
nRBC: 0 % (ref 0.0–0.2)

## 2023-12-11 LAB — CMP (CANCER CENTER ONLY)
ALT: 35 U/L (ref 0–44)
AST: 18 U/L (ref 15–41)
Albumin: 3.9 g/dL (ref 3.5–5.0)
Alkaline Phosphatase: 60 U/L (ref 38–126)
Anion gap: 1 — ABNORMAL LOW (ref 5–15)
BUN: 10 mg/dL (ref 6–20)
CO2: 28 mmol/L (ref 22–32)
Calcium: 8.5 mg/dL — ABNORMAL LOW (ref 8.9–10.3)
Chloride: 108 mmol/L (ref 98–111)
Creatinine: 0.61 mg/dL (ref 0.44–1.00)
GFR, Estimated: >60 mL/min (ref >60)
Glucose, Bld: 87 mg/dL (ref 70–99)
Potassium: 4.6 mmol/L (ref 3.5–5.1)
Sodium: 137 mmol/L (ref 135–145)
Total Bilirubin: 0.3 mg/dL (ref 0.0–1.2)
Total Protein: 7 g/dL (ref 6.5–8.1)

## 2023-12-11 LAB — IRON AND IRON BINDING CAPACITY (CC-WL,HP ONLY)
Iron: 106 ug/dL (ref 28–170)
Saturation Ratios: 31 % (ref 10.4–31.8)
TIBC: 346 ug/dL (ref 250–450)
UIBC: 240 ug/dL (ref 148–442)

## 2023-12-11 MED ORDER — CYANOCOBALAMIN 1000 MCG/ML IJ SOLN
1000.0000 ug | Freq: Once | INTRAMUSCULAR | Status: AC
  Administered 2023-12-11: 1000 ug via INTRAMUSCULAR
  Filled 2023-12-11: qty 1

## 2023-12-12 ENCOUNTER — Encounter: Payer: Self-pay | Admitting: Hematology and Oncology

## 2023-12-12 LAB — FERRITIN: Ferritin: 19 ng/mL (ref 11–307)

## 2023-12-17 ENCOUNTER — Encounter: Payer: Self-pay | Admitting: *Deleted

## 2023-12-17 ENCOUNTER — Other Ambulatory Visit: Payer: Self-pay | Admitting: *Deleted

## 2023-12-18 ENCOUNTER — Inpatient Hospital Stay: Payer: Commercial Managed Care - HMO

## 2023-12-18 ENCOUNTER — Encounter (INDEPENDENT_AMBULATORY_CARE_PROVIDER_SITE_OTHER): Payer: Self-pay | Admitting: Adult Health

## 2023-12-18 ENCOUNTER — Inpatient Hospital Stay (HOSPITAL_BASED_OUTPATIENT_CLINIC_OR_DEPARTMENT_OTHER): Payer: 59 | Admitting: Hematology and Oncology

## 2023-12-18 ENCOUNTER — Telehealth (INDEPENDENT_AMBULATORY_CARE_PROVIDER_SITE_OTHER): Admitting: Adult Health

## 2023-12-18 VITALS — BP 146/65 | HR 73 | Temp 98.6°F | Resp 17 | Wt 282.6 lb

## 2023-12-18 VITALS — Ht 71.0 in

## 2023-12-18 DIAGNOSIS — E559 Vitamin D deficiency, unspecified: Secondary | ICD-10-CM

## 2023-12-18 DIAGNOSIS — E669 Obesity, unspecified: Secondary | ICD-10-CM

## 2023-12-18 DIAGNOSIS — D508 Other iron deficiency anemias: Secondary | ICD-10-CM

## 2023-12-18 DIAGNOSIS — E538 Deficiency of other specified B group vitamins: Secondary | ICD-10-CM | POA: Diagnosis not present

## 2023-12-18 DIAGNOSIS — I10 Essential (primary) hypertension: Secondary | ICD-10-CM | POA: Diagnosis not present

## 2023-12-18 DIAGNOSIS — Z6838 Body mass index (BMI) 38.0-38.9, adult: Secondary | ICD-10-CM

## 2023-12-18 MED ORDER — METFORMIN HCL 500 MG PO TABS: ORAL_TABLET | ORAL | 0 refills | Status: DC

## 2023-12-18 MED ORDER — ZEPBOUND 10 MG/0.5ML ~~LOC~~ SOAJ: 10.0000 mg | SUBCUTANEOUS | 0 refills | Status: DC

## 2023-12-18 MED ORDER — CYANOCOBALAMIN 1000 MCG/ML IJ SOLN
1000.0000 ug | Freq: Once | INTRAMUSCULAR | Status: AC
  Administered 2023-12-18: 1000 ug via INTRAMUSCULAR
  Filled 2023-12-18: qty 1

## 2023-12-18 NOTE — Progress Notes (Signed)
 [facility] Cancer Follow up:    Patient, No Pcp Per No address on file   DIAGNOSIS: Iron deficiency anemia, B12 deficiency  SUMMARY OF HEMATOLOGIC HISTORY: S/p gastric bypass surgery in approximately 2018 and became pregnant in 2023 Oral iron 65mg  elemental iron BID begain in 03/2022 (ferritin 6, sat 4%, iron 19, TIBC 521, hemoglobin 8.5, MCV 76.3) 05/09/2022 hemoglobin 7.9, MCV 75.2; Venofer 200mg  x 5  given beginning 05/15/2022  On 07/29/2022 her cbc was 8.3 and MCV improved to 81.4; ferritin on 10/31 6--was recommended to restart oral iron 08/10/2022 ferritin is 4--Venofer 200mg  x 5 began on 08/16/2022 Hemoglobin 09/19/2022 10.5, MCV 84.7 Venofer 200mg  x 4 beginning 12/01/2022 Ferritin 04/03/2023: 125  CURRENT THERAPY: Intermittent IV iron, B12 injections  INTERVAL HISTORY: Carrie Shaw 38 y.o. female returns for follow-up and evaluation.    Carrie Shaw is a 38 year old female with a history of gastric bypass who presents with fatigue and iron deficiency.  She experiences persistent fatigue and iron deficiency, which have not improved over time. Despite sleeping from 10 PM to 8 AM, she continues to feel tired upon waking. Her hemoglobin and ferritin levels have decreased to 11.4 and 19, respectively, since her last iron infusion in July 2023.  She underwent gastric bypass surgery in 2018, which has affected her ability to absorb iron, contributing to her iron deficiency. She received an iron infusion in July 2023, which temporarily improved her symptoms. However, her hemoglobin and ferritin levels have since decreased, indicating a need for further intervention.  She is currently receiving weekly B12 injections due to low B12 levels, which were increased from monthly. She has completed two weeks of weekly injections and will transition back to monthly after four weeks. She has recently started taking metformin and continues to take vitamin D. She is no  longer taking Zetban, Coricon, Lasix, or prenatal vitamins, but has resumed labetalol for hypertension.  She mentions a family history of a genetic mutation referred to as 'KJACs,' which affects her mother's iron levels. Her mother is also experiencing fatigue and is under the care of another doctor. She is concerned about a potential genetic link to her own iron deficiency.  She experiences pica, specifically craving ice, which she attributes to her iron deficiency. No shortness of breath or falls. Her 57-month-old child keeps her busy, and she manages her fatigue while balancing childcare and household responsibilities.   Patient Active Problem List   Diagnosis Date Noted   History of gestational hypertension 10/16/2022   Postpartum care following vaginal delivery 09/18/2022   Benign essential HTN, chronic, antepartum, third trimester 09/16/2022   Medication management 09/05/2022   [redacted] weeks gestation of pregnancy 09/05/2022   Gestational hypertension, third trimester 09/05/2022   B12 deficiency 07/18/2022   Vitamin D deficiency 07/18/2022   Absolute anemia 07/18/2022   Other fatigue 07/04/2022   SOBOE (shortness of breath on exertion) 07/04/2022   [redacted] weeks gestation of pregnancy 07/04/2022   Weight gain following gastric bypass surgery 07/04/2022   Intestinal malabsorption following gastrectomy 07/04/2022   Depression 07/04/2022   IDA (iron deficiency anemia) 03/29/2022   Postpartum hypertension 08/29/2017   Intraoperative bladder injury 08/17/2017   Pelvic peritoneal adhesions, female 08/17/2017   Ectopic pregnancy without intrauterine pregnancy 08/16/2017   Morbid obesity (HCC) with starting BMI 43 04/23/2013    has no known allergies.  MEDICAL HISTORY: Past Medical History:  Diagnosis Date   ADD (attention deficit disorder)    Anemia  Anxiety    B12 deficiency    Back pain    Bilateral swelling of feet    Depression    Gallbladder problem    HSV infection     Hypertension    Joint pain    Lower extremity edema    Obesity    Pregnancy induced hypertension    Sleep apnea    mild no mask   SOB (shortness of breath)    SVD (spontaneous vaginal delivery)    x 2   Termination of pregnancy (fetus)    x 2   UTI (urinary tract infection)    Vitamin B12 deficiency    Vitamin D deficiency     SURGICAL HISTORY: Past Surgical History:  Procedure Laterality Date   APPENDECTOMY     CYSTOSCOPY N/A 08/16/2017   Procedure: CYSTOSCOPY;  Surgeon: Conan Bowens, MD;  Location: [facility];  Service: Gynecology;  Laterality: N/A;   GASTRIC ROUX-EN-Y N/A 01/01/2018   Procedure: LAPAROSCOPIC ROUX-EN-Y GASTRIC BYPASS WITH UPPER ENDOSCOPY;  Surgeon: Gaynelle Adu, MD;  Location: [facility];  Service: General;  Laterality: N/A;   LAPAROSCOPY N/A 08/16/2017   Procedure: LAPAROSCOPY DIAGNOSTIC;  Surgeon: Conan Bowens, MD;  Location: [facility];  Service: Gynecology;  Laterality: N/A;   LAPAROTOMY N/A 08/16/2017   Procedure: EXPLORATORY LAPAROTOMY WITH REMOVAL OF LEFT ECTOPIC PREGNANCY, LEFT SALPINGECTOMY AND CYSTOTOMY OF BLADDER;  Surgeon: Conan Bowens, MD;  Location: [facility];  Service: Gynecology;  Laterality: N/A;   LYSIS OF ADHESION N/A 08/16/2017   Procedure: LYSIS OF ADHESION;  Surgeon: Conan Bowens, MD;  Location: [facility];  Service: Gynecology;  Laterality: N/A;   Tummy tuck     10/2019   UNILATERAL SALPINGECTOMY Left 08/16/2017   Procedure: UNILATERAL SALPINGECTOMY;  Surgeon: Conan Bowens, MD;  Location: [facility];  Service: Gynecology;  Laterality: Left;    SOCIAL HISTORY: Social History   Socioeconomic History   Marital status: Married    Spouse name: Carrie Shaw   Number of children: 2   Years of education: Not on file   Highest education level: Not on file  Occupational History   Occupation: Clinical biochemist Rep  Tobacco Use   Smoking status: Former    Current packs/day: 0.00    Average packs/day: 0.3 packs/day for 10.0 years (2.5 ttl pk-yrs)    Types:  Cigarettes    Start date: 12/2011    Quit date: 11/2021    Years since quitting: 2.0   Smokeless tobacco: Never  Vaping Use   Vaping status: Never Used  Substance and Sexual Activity   Alcohol use: Not Currently    Comment: rare   Drug use: No   Sexual activity: Yes    Partners: Male    Birth control/protection: None  Other Topics Concern   Not on file  Social History Narrative   She works at [employer]    Two children   Not married       She likes to shop and travel    Social Drivers of Corporate investment banker Strain: Not on file  Food Insecurity: No Food Insecurity (09/16/2022)   Hunger Vital Sign    Worried About Running Out of Food in the Last Year: Never true    Ran Out of Food in the Last Year: Never true  Transportation Needs: No Transportation Needs (09/16/2022)   PRAPARE - Administrator, Civil Service (Medical): No    Lack of Transportation (Non-Medical): No  Physical Activity: Not on file  Stress: Not on file  Social Connections: Not on file  Intimate Partner Violence: Not At Risk (09/16/2022)   Humiliation, Afraid, Rape, and Kick questionnaire    Fear of Current or Ex-Partner: No    Emotionally Abused: No    Physically Abused: No    Sexually Abused: No    FAMILY HISTORY: Family History  Problem Relation Age of Onset   Depression Mother    Hypertension Mother    Anxiety disorder Mother    Sleep apnea Mother    Obesity Mother    Eating disorder Mother    Hypertension Father    Depression Father    Anxiety disorder Father    Sleep apnea Father    Alcoholism Father    Drug abuse Father    Obesity Father    Schizophrenia Sister    Bipolar disorder Sister     Review of Systems  Constitutional:  Positive for fatigue. Negative for appetite change, chills, fever and unexpected weight change.  HENT:   Negative for hearing loss, lump/mass and trouble swallowing.   Eyes:  Negative for eye problems and icterus.  Respiratory:  Negative  for chest tightness, cough and shortness of breath.   Cardiovascular:  Negative for chest pain, leg swelling and palpitations.  Gastrointestinal:  Negative for abdominal distention, abdominal pain, constipation, diarrhea, nausea and vomiting.  Endocrine: Negative for hot flashes.  Genitourinary:  Negative for difficulty urinating.   Musculoskeletal:  Negative for arthralgias.  Skin:  Negative for itching and rash.  Neurological:  Negative for dizziness, extremity weakness, headaches and numbness.  Hematological:  Negative for adenopathy. Does not bruise/bleed easily.  Psychiatric/Behavioral:  Negative for depression. The patient is not nervous/anxious.       PHYSICAL EXAMINATION    Vitals:   12/18/23 1451  BP: (!) 146/65  Pulse: 73  Resp: 17  Temp: 98.6 F (37 C)  SpO2: 100%   Physical Exam Constitutional:      Appearance: Normal appearance.  Cardiovascular:     Rate and Rhythm: Normal rate and regular rhythm.     Pulses: Normal pulses.     Heart sounds: Normal heart sounds.  Pulmonary:     Effort: Pulmonary effort is normal.     Breath sounds: Normal breath sounds.  Abdominal:     General: Abdomen is flat.     Palpations: Abdomen is soft.  Musculoskeletal:        General: No swelling. Normal range of motion.     Cervical back: Normal range of motion. No rigidity.  Lymphadenopathy:     Cervical: No cervical adenopathy.  Skin:    General: Skin is warm and dry.  Neurological:     Mental Status: She is alert.       LABORATORY DATA:  CBC    Component Value Date/Time   WBC 7.5 12/11/2023 1501   WBC 7.3 05/01/2023 1103   RBC 3.91 12/11/2023 1501   HGB 11.4 (L) 12/11/2023 1501   HGB 9.8 (L) 09/13/2022 1027   HCT 36.0 12/11/2023 1501   HCT 32.0 (L) 09/13/2022 1027   PLT 309 12/11/2023 1501   PLT 281 09/13/2022 1027   MCV 92.1 12/11/2023 1501   MCV 86 09/13/2022 1027   MCH 29.2 12/11/2023 1501   MCHC 31.7 12/11/2023 1501   RDW 14.5 12/11/2023 1501   RDW  21.8 (H) 09/13/2022 1027   LYMPHSABS 2.6 12/11/2023 1501   LYMPHSABS 2.0 01/06/2020 1103   MONOABS 0.5  12/11/2023 1501   EOSABS 0.2 12/11/2023 1501   EOSABS 0.1 01/06/2020 1103   BASOSABS 0.0 12/11/2023 1501   BASOSABS 0.0 01/06/2020 1103     ASSESSMENT and THERAPY PLAN:   IDA (iron deficiency anemia) Iron deficiency anemia Iron deficiency anemia due to malabsorption post-gastric bypass. Hemoglobin 11.4, ferritin 19. Increased fatigue and pica noted. Iron infusions necessary. - Administer iron infusion of 300 mg three times to reduce visit frequency. - Schedule follow-up labs in six months to reassess iron levels. - Evaluate sleep quality if fatigue persists after iron treatment.  Vitamin B12 deficiency Receiving weekly B12 injections due to low levels. Plan to revert to monthly injections after four weekly doses. - Continue weekly B12 injections for one more week, then switch to monthly injections.  Hypertension On labetalol for hypertension, initially prescribed during pregnancy. Continue as per recent instructions from another provider. - Continue labetalol as instructed by the healthcare provider.  Follow-up Requires follow-up to monitor treatment response and adjust management. - Schedule follow-up appointment in six months with labs. - Advise her to call if any issues arise before the scheduled follow-up.   All questions were answered. The patient knows to call the clinic with any problems, questions or concerns. We can certainly see the patient much sooner if necessary.  Total encounter time:30 minutes*in face-to-face visit time, chart review, lab review, care coordination, order entry, and documentation of the encounter time.   *Total Encounter Time as defined by the Centers for Medicare and Medicaid Services includes, in addition to the face-to-face time of a patient visit (documented in the note above) non-face-to-face time: obtaining and reviewing outside history,  ordering and reviewing medications, tests or procedures, care coordination (communications with other health care professionals or caregivers) and documentation in the medical record.

## 2023-12-18 NOTE — Progress Notes (Signed)
 WEIGHT SUMMARY AND BIOMETRICS  No data recorded Anthropometric Measurements Height: 5\' 11"  (1.803 m) Weight at Last Visit: 278 lb Starting Weight: 312 lb Total Weight Loss (lbs): 34 lb (15.4 kg) Peak Weight: 410 lb   No data recorded Other Clinical Data Today's Visit #: Video Starting Date: 07/04/22    Chief Complaint:  I connected with  Carrie Shaw on 12/18/23 by a  Phone- unable to connect via Video Feed  enabled telemedicine application and verified that I am speaking with the correct person using two identifiers.  Patient Location: Home  Provider Location: Office/Clinic  I discussed the limitations of evaluation and management by telemedicine. The patient expressed understanding and agreed to proceed.  OBESITY Carrie Shaw is here to discuss her progress with her obesity treatment plan.  She is on the following a lower carbohydrate, vegetable and lean protein rich diet plan and states she is following her eating plan approximately 85 % of the time.  She states she is exercising Walking 30 minutes 5 times per week.   Interim History:  Carrie Shaw needed to convert from in person to MyChart Video Visit due to lack of child care for her 28 year old daughter. She has three daughters, ages 18,13, and 1  Carrie Shaw oldest daughter has been meal planning and prepping for the family. They have been consistently consuming the following lean protein: ground [place]  93% ground beef Chicken  Shrimp Fish   Exercise-brisk walking 30 min at least 5 x week  Her Zepbound was covered under her husband's insurance. They are currently separated and he lost his job- unbeknownst to her. Last Zebpound refill was denied, as she was "inactive" under that plan. She has since secured her own health insurance- [employer] Last dose of Zepbound 15mg  on/about 11/03/5023 She was tolerating max dose Zepbound well- denies SE She would like to restart injection  therapy Discussed alternative options if Zepbound not on her formulary Discussed risks/benefits of Metformin therapy  Subjective:   1. Essential hypertension  Latest Reference Range & Units 12/11/23 15:01  GFR, Est Non African American >60 mL/min >60   She developed postpartum HTN  Last OV with Dr. Servando Salina 10/16/2022 History of Present Illness:     Carrie Shaw is a 38 y.o. female [[id number]] who returns for follow up of postpartum hypertension.  No specific complaints - she lost her uncle today.  Medication Instructions:  Your physician has recommended you make the following change in your medication:  STOP: Nifedipine START: Lasix 40 mg on Tuesday and Thursday START: Potassium 20 mEq Tuesday and Thursday     Please take your blood pressure daily for 2 weeks and send in a MyChart message. Please include heart rates.    HOW TO TAKE YOUR BLOOD PRESSURE: Rest 5 minutes before taking your blood pressure. Don't smoke or drink caffeinated beverages for at least 30 minutes before. Take your blood pressure before (not after) you eat. Sit comfortably with your back supported and both feet on the floor (don't cross your legs). Elevate your arm to heart level on a table or a desk. Use the proper sized cuff. It should fit smoothly and snugly around your bare upper arm. There should be enough room to slip a fingertip under the cuff. The bottom edge of the cuff should be 1 inch above the crease of the elbow. Ideally, take 3 measurements at one sitting and record the average.   *If you need a  refill on your cardiac medications before your next appointment, please call your pharmacy*     Lab Work: None   Testing/Procedures: None     Follow-Up: At [facility], you and your health needs are our priority.  As part of our continuing mission to provide you with exceptional heart care, we have created designated Provider Care Teams.  These Care Teams include your primary  Cardiologist (physician) and Advanced Practice Providers (APPs -  Physician Assistants and Nurse Practitioners) who all work together to provide you with the care you need, when you need it.   We recommend signing up for the patient portal called "MyChart".  Sign up information is provided on this After Visit Summary.  MyChart is used to connect with patients for Virtual Visits (Telemedicine).  Patients are able to view lab/test results, encounter notes, upcoming appointments, etc.  Non-urgent messages can be sent to your provider as well.   To learn more about what you can do with MyChart, go to [email or web address].     Your next appointment:   12 week(s)       She has not followed up with Dr. Servando Salina as directed. She reports taking Labetolol 200mg  daily and KLOR-CON 20 mEq She has been off Lasix 40mg  for months. Reviewed how these medications were ordered  2. Vitamin D deficiency She is on bi-weekly Ergocalciferol and daily prenatl MVI She endorses stable energy levels  3. Iron deficiency anemia secondary to inadequate dietary iron intake 05/01/2023 Oncology OV Notes DIAGNOSIS: Iron deficiency anemia, B12 deficiency   SUMMARY OF HEMATOLOGIC HISTORY: S/p gastric bypass surgery in approximately 2018 and became pregnant in 2023 Oral iron 65mg  elemental iron BID begain in 03/2022 (ferritin 6, sat 4%, iron 19, TIBC 521, hemoglobin 8.5, MCV 76.3) 05/09/2022 hemoglobin 7.9, MCV 75.2; Venofer 200mg  x 5  given beginning 05/15/2022  On 07/29/2022 her cbc was 8.3 and MCV improved to 81.4; ferritin on 10/31 6--was recommended to restart oral iron 08/10/2022 ferritin is 4--Venofer 200mg  x 5 began on 08/16/2022 Hemoglobin 09/19/2022 10.5, MCV 84.7 Venofer 200mg  x 4 beginning 12/01/2022 Ferritin 04/03/2023: 125   CURRENT THERAPY: Intermittent IV iron, B12 injections   INTERVAL HISTORY: Carrie Shaw 38 y.o. female returns for follow-up and evaluation.  Her most recent lab testing indicated  her ferritin was 125 demonstrating response to the IV iron she received in March.  She continues to receive B12 injections every 4 weeks.  She is tolerating these well.  Her B12 level has slowly increased.  She also has a vitamin D deficiency and needs her vitamin D prescription refilled.   She continues to have some mild fatigue however notes that this is slightly improved with her daughter being 84 months old now.  Assessment/Plan:   1. Essential hypertension Remain off Lasix STOP K+ Increase Labetalol 200mg  to at least BID F/U WITH DR. TOBB Pt verbalized understanding and agreement  2. Vitamin D deficiency (Primary) Continue current supplementation  3. Iron deficiency anemia secondary to inadequate dietary iron intake Continue close f/u with Oncology  4. Obesity,current BMI 38.79 Restart  tirzepatide (ZEPBOUND) 10 MG/0.5ML Pen Inject 10 mg into the skin once a week. Dispense: 2 mL, Refills: 0 ordered   Start   metFORMIN (GLUCOPHAGE) 500 MG tablet 1/2 tab daily with breakfast for one week, then increase to 1 tab daily with breakfast- hold at this dose Dispense: 30 tablet, Refills: 0 ordered   Taneasha is currently in the action stage of change. As such, her  goal is to continue with weight loss efforts. She has agreed to following a lower carbohydrate, vegetable and lean protein rich diet plan.   Exercise goals: For substantial health benefits, adults should do at least 150 minutes (2 hours and 30 minutes) a week of moderate-intensity, or 75 minutes (1 hour and 15 minutes) a week of vigorous-intensity aerobic physical activity, or an equivalent combination of moderate- and vigorous-intensity aerobic activity. Aerobic activity should be performed in episodes of at least 10 minutes, and preferably, it should be spread throughout the week.  Behavioral modification strategies: increasing lean protein intake, decreasing simple carbohydrates, increasing vegetables, increasing water intake,  no skipping meals, meal planning and cooking strategies, keeping healthy foods in the home, ways to avoid boredom eating, and planning for success.  Reizel has agreed to follow-up with our clinic in 4 weeks. She was informed of the importance of frequent follow-up visits to maximize her success with intensive lifestyle modifications for her multiple health conditions.   Objective:   Height 5\' 11"  (1.803 m), unknown if currently breastfeeding. Body mass index is 38.77 kg/m.  General: Cooperative, alert, well developed, in no acute distress. HEENT: Conjunctivae and lids unremarkable. Cardiovascular: Regular rhythm.  Lungs: Normal work of breathing. Neurologic: No focal deficits.   Lab Results  Component Value Date   CREATININE 0.61 12/11/2023   BUN 10 12/11/2023   NA 137 12/11/2023   K 4.6 12/11/2023   CL 108 12/11/2023   CO2 28 12/11/2023   Lab Results  Component Value Date   ALT 35 12/11/2023   AST 18 12/11/2023   ALKPHOS 60 12/11/2023   BILITOT 0.3 12/11/2023   Lab Results  Component Value Date   HGBA1C 5.5 01/25/2023   HGBA1C 5.0 07/04/2022   HGBA1C 4.9 01/06/2020   Lab Results  Component Value Date   INSULIN 8.1 07/04/2022   INSULIN 7.8 01/06/2020   Lab Results  Component Value Date   TSH 1.180 04/03/2023   Lab Results  Component Value Date   CHOL 195 07/04/2022   HDL 59 07/04/2022   LDLCALC 119 (H) 07/04/2022   TRIG 96 07/04/2022   CHOLHDL 3.4 12/25/2014   Lab Results  Component Value Date   VD25OH 25.8 (L) 04/03/2023   VD25OH 26.0 (L) 01/25/2023   VD25OH 13.2 (L) 07/04/2022   Lab Results  Component Value Date   WBC 7.5 12/11/2023   HGB 11.4 (L) 12/11/2023   HCT 36.0 12/11/2023   MCV 92.1 12/11/2023   PLT 309 12/11/2023   Lab Results  Component Value Date   IRON 106 12/11/2023   TIBC 346 12/11/2023   FERRITIN 19 12/11/2023   Attestation Statements:   Reviewed by clinician on day of visit: allergies, medications, problem list, medical  history, surgical history, family history, social history, and previous encounter notes.  Time spent on visit including pre-visit chart review and post-visit care and charting was 26 minutes.   I have reviewed the above documentation for accuracy and completeness, and I agree with the above. -  Tyrrell Stephens d. Daymion Nazaire, NP-C

## 2023-12-18 NOTE — Assessment & Plan Note (Signed)
 Iron deficiency anemia Iron deficiency anemia due to malabsorption post-gastric bypass. Hemoglobin 11.4, ferritin 19. Increased fatigue and pica noted. Iron infusions necessary. - Administer iron infusion of 300 mg three times to reduce visit frequency. - Schedule follow-up labs in six months to reassess iron levels. - Evaluate sleep quality if fatigue persists after iron treatment.  Vitamin B12 deficiency Receiving weekly B12 injections due to low levels. Plan to revert to monthly injections after four weekly doses. - Continue weekly B12 injections for one more week, then switch to monthly injections.  Hypertension On labetalol for hypertension, initially prescribed during pregnancy. Continue as per recent instructions from another provider. - Continue labetalol as instructed by the healthcare provider.  Follow-up Requires follow-up to monitor treatment response and adjust management. - Schedule follow-up appointment in six months with labs. - Advise her to call if any issues arise before the scheduled follow-up.

## 2023-12-21 ENCOUNTER — Encounter (INDEPENDENT_AMBULATORY_CARE_PROVIDER_SITE_OTHER): Payer: Self-pay | Admitting: Adult Health

## 2023-12-25 ENCOUNTER — Inpatient Hospital Stay: Payer: Commercial Managed Care - HMO

## 2023-12-25 DIAGNOSIS — E538 Deficiency of other specified B group vitamins: Secondary | ICD-10-CM | POA: Diagnosis not present

## 2023-12-25 DIAGNOSIS — D508 Other iron deficiency anemias: Secondary | ICD-10-CM

## 2023-12-25 MED ORDER — CYANOCOBALAMIN 1000 MCG/ML IJ SOLN
1000.0000 ug | Freq: Once | INTRAMUSCULAR | Status: AC
  Administered 2023-12-25: 1000 ug via INTRAMUSCULAR
  Filled 2023-12-25: qty 1

## 2023-12-26 ENCOUNTER — Telehealth (INDEPENDENT_AMBULATORY_CARE_PROVIDER_SITE_OTHER): Payer: Self-pay | Admitting: Adult Health

## 2023-12-26 NOTE — Telephone Encounter (Signed)
 Kathlene November from the [employer] [facility] in [place] called stating that the Zepbound prescription needs an ICD-10 code. Please call him back at [phone].

## 2023-12-31 ENCOUNTER — Telehealth (INDEPENDENT_AMBULATORY_CARE_PROVIDER_SITE_OTHER): Payer: Self-pay

## 2023-12-31 NOTE — Telephone Encounter (Signed)
 Zepbound 12.5mg  PA in Cover my meds-Patient Inactive Do not see Rx for 12.5mg  Zepbound

## 2024-01-01 ENCOUNTER — Inpatient Hospital Stay: Payer: Commercial Managed Care - HMO

## 2024-01-01 ENCOUNTER — Other Ambulatory Visit: Payer: Self-pay | Admitting: *Deleted

## 2024-01-01 ENCOUNTER — Inpatient Hospital Stay: Payer: Commercial Managed Care - HMO | Attending: Hematology and Oncology

## 2024-01-01 DIAGNOSIS — E538 Deficiency of other specified B group vitamins: Secondary | ICD-10-CM

## 2024-01-01 DIAGNOSIS — Z9884 Bariatric surgery status: Secondary | ICD-10-CM | POA: Insufficient documentation

## 2024-01-01 DIAGNOSIS — D508 Other iron deficiency anemias: Secondary | ICD-10-CM | POA: Diagnosis not present

## 2024-01-01 DIAGNOSIS — K909 Intestinal malabsorption, unspecified: Secondary | ICD-10-CM | POA: Diagnosis not present

## 2024-01-01 DIAGNOSIS — R5383 Other fatigue: Secondary | ICD-10-CM | POA: Diagnosis not present

## 2024-01-01 DIAGNOSIS — Z87891 Personal history of nicotine dependence: Secondary | ICD-10-CM | POA: Diagnosis not present

## 2024-01-01 LAB — CBC WITH DIFFERENTIAL (CANCER CENTER ONLY)
Abs Immature Granulocytes: 0.02 10*3/uL (ref 0.00–0.07)
Basophils Absolute: 0 10*3/uL (ref 0.0–0.1)
Basophils Relative: 0 %
Eosinophils Absolute: 0.2 10*3/uL (ref 0.0–0.5)
Eosinophils Relative: 3 %
HCT: 35 % — ABNORMAL LOW (ref 36.0–46.0)
Hemoglobin: 11.3 g/dL — ABNORMAL LOW (ref 12.0–15.0)
Immature Granulocytes: 0 %
Lymphocytes Relative: 30 %
Lymphs Abs: 2.2 10*3/uL (ref 0.7–4.0)
MCH: 29.6 pg (ref 26.0–34.0)
MCHC: 32.3 g/dL (ref 30.0–36.0)
MCV: 91.6 fL (ref 80.0–100.0)
Monocytes Absolute: 0.5 10*3/uL (ref 0.1–1.0)
Monocytes Relative: 7 %
Neutro Abs: 4.4 10*3/uL (ref 1.7–7.7)
Neutrophils Relative %: 60 %
Platelet Count: 311 10*3/uL (ref 150–400)
RBC: 3.82 MIL/uL — ABNORMAL LOW (ref 3.87–5.11)
RDW: 14.3 % (ref 11.5–15.5)
WBC Count: 7.4 10*3/uL (ref 4.0–10.5)
nRBC: 0 % (ref 0.0–0.2)

## 2024-01-01 MED ORDER — CYANOCOBALAMIN 1000 MCG/ML IJ SOLN
1000.0000 ug | Freq: Once | INTRAMUSCULAR | Status: AC
  Administered 2024-01-01: 1000 ug via INTRAMUSCULAR
  Filled 2024-01-01: qty 1

## 2024-01-02 ENCOUNTER — Inpatient Hospital Stay

## 2024-01-02 VITALS — BP 146/75 | HR 59 | Temp 98.0°F | Resp 17

## 2024-01-02 DIAGNOSIS — D508 Other iron deficiency anemias: Secondary | ICD-10-CM

## 2024-01-02 DIAGNOSIS — E538 Deficiency of other specified B group vitamins: Secondary | ICD-10-CM | POA: Diagnosis not present

## 2024-01-02 MED ORDER — SODIUM CHLORIDE 0.9 % IV SOLN
300.0000 mg | Freq: Once | INTRAVENOUS | Status: AC
  Administered 2024-01-02: 300 mg via INTRAVENOUS
  Filled 2024-01-02: qty 300

## 2024-01-02 MED ORDER — SODIUM CHLORIDE 0.9 % IV SOLN: INTRAVENOUS | Status: DC

## 2024-01-02 NOTE — Patient Instructions (Addendum)
 [facility] - A DEPT OF [facility]  Discharge Instructions: Thank you for choosing [facility] to provide your oncology and hematology care.   If you have a lab appointment with the Cancer Center, please go directly to the Cancer Center and check in at the registration area.   Wear comfortable clothing and clothing appropriate for easy access to any Portacath or PICC line.   We strive to give you quality time with your provider. You may need to reschedule your appointment if you arrive late (15 or more minutes).  Arriving late affects you and other patients whose appointments are after yours.  Also, if you miss three or more appointments without notifying the office, you may be dismissed from the clinic at the provider's discretion.      For prescription refill requests, have your pharmacy contact our office and allow 72 hours for refills to be completed.    Today you received the following chemotherapy and/or immunotherapy agents Venofer      To help prevent nausea and vomiting after your treatment, we encourage you to take your nausea medication as directed.  BELOW ARE SYMPTOMS THAT SHOULD BE REPORTED IMMEDIATELY: *FEVER GREATER THAN 100.4 F (38 C) OR HIGHER *CHILLS OR SWEATING *NAUSEA AND VOMITING THAT IS NOT CONTROLLED WITH YOUR NAUSEA MEDICATION *UNUSUAL SHORTNESS OF BREATH *UNUSUAL BRUISING OR BLEEDING *URINARY PROBLEMS (pain or burning when urinating, or frequent urination) *BOWEL PROBLEMS (unusual diarrhea, constipation, pain near the anus) TENDERNESS IN MOUTH AND THROAT WITH OR WITHOUT PRESENCE OF ULCERS (sore throat, sores in mouth, or a toothache) UNUSUAL RASH, SWELLING OR PAIN  UNUSUAL VAGINAL DISCHARGE OR ITCHING   Items with * indicate a potential emergency and should be followed up as soon as possible or go to the Emergency Department if any problems should occur.  Please show the CHEMOTHERAPY ALERT CARD or IMMUNOTHERAPY  ALERT CARD at check-in to the Emergency Department and triage nurse.  Should you have questions after your visit or need to cancel or reschedule your appointment, please contact [facility] - A DEPT OF [facility]  Dept: [phone]  and follow the prompts.  Office hours are 8:00 a.m. to 4:30 p.m. Monday - Friday. Please note that voicemails left after 4:00 p.m. may not be returned until the following business day.  We are closed weekends and major holidays. You have access to a nurse at all times for urgent questions. Please call the main number to the clinic Dept: [phone] and follow the prompts.   For any non-urgent questions, you may also contact your provider using MyChart. We now offer e-Visits for anyone 19 and older to request care online for non-urgent symptoms. For details visit [email or web address].   Also download the MyChart app! Go to the app store, search "MyChart", open the app, select [place], and log in with your MyChart username and password.  Iron Sucrose Injection What is this medication? IRON SUCROSE (EYE ern SOO krose) treats low levels of iron (iron deficiency anemia) in people with kidney disease. Iron is a mineral that plays an important role in making red blood cells, which carry oxygen from your lungs to the rest of your body. This medicine may be used for other purposes; ask your health care provider or pharmacist if you have questions. COMMON BRAND NAME(S): Venofer What should I tell my care team before I take this medication? They need to know if  you have any of these conditions: Anemia not caused by low iron levels Heart disease High levels of iron in the blood Kidney disease Liver disease An unusual or allergic reaction to iron, other medications, foods, dyes, or preservatives Pregnant or trying to get pregnant Breastfeeding How should I use this medication? This medication is infused into a vein. It is given by  your care team in a hospital or clinic setting. Talk to your care team about the use of this medication in children. While it may be prescribed for children as young as 2 years for selected conditions, precautions do apply. Overdosage: If you think you have taken too much of this medicine contact a poison control center or emergency room at once. NOTE: This medicine is only for you. Do not share this medicine with others. What if I miss a dose? Keep appointments for follow-up doses. It is important not to miss your dose. Call your care team if you are unable to keep an appointment. What may interact with this medication? Do not take this medication with any of the following: Deferoxamine Dimercaprol Other iron products This medication may also interact with the following: Chloramphenicol Deferasirox This list may not describe all possible interactions. Give your health care provider a list of all the medicines, herbs, non-prescription drugs, or dietary supplements you use. Also tell them if you smoke, drink alcohol, or use illegal drugs. Some items may interact with your medicine. What should I watch for while using this medication? Visit your care team for regular checks on your progress. Tell your care team if your symptoms do not start to get better or if they get worse. You may need blood work done while you are taking this medication. You may need to eat more foods that contain iron. Talk to your care team. Foods that contain iron include whole grains or cereals, dried fruits, beans, peas, leafy green vegetables, and organ meats (liver, kidney). What side effects may I notice from receiving this medication? Side effects that you should report to your care team as soon as possible: Allergic reactions--skin rash, itching, hives, swelling of the face, lips, tongue, or throat Low blood pressure--dizziness, feeling faint or lightheaded, blurry vision Shortness of breath Side effects that  usually do not require medical attention (report to your care team if they continue or are bothersome): Flushing Headache Joint pain Muscle pain Nausea Pain, redness, or irritation at injection site This list may not describe all possible side effects. Call your doctor for medical advice about side effects. You may report side effects to FDA at 1-800-FDA-1088. Where should I keep my medication? This medication is given in a hospital or clinic. It will not be stored at home. NOTE: This sheet is a summary. It may not cover all possible information. If you have questions about this medicine, talk to your doctor, pharmacist, or health care provider.  2024 Elsevier/Gold Standard (2023-05-09 00:00:00)

## 2024-01-07 ENCOUNTER — Other Ambulatory Visit: Payer: Self-pay | Admitting: *Deleted

## 2024-01-07 DIAGNOSIS — E538 Deficiency of other specified B group vitamins: Secondary | ICD-10-CM

## 2024-01-07 DIAGNOSIS — D508 Other iron deficiency anemias: Secondary | ICD-10-CM

## 2024-01-08 ENCOUNTER — Inpatient Hospital Stay: Payer: Commercial Managed Care - HMO

## 2024-01-08 ENCOUNTER — Inpatient Hospital Stay (HOSPITAL_BASED_OUTPATIENT_CLINIC_OR_DEPARTMENT_OTHER): Payer: Commercial Managed Care - HMO | Admitting: Hematology and Oncology

## 2024-01-08 VITALS — BP 141/60 | HR 72 | Temp 97.4°F | Resp 17 | Wt 289.4 lb

## 2024-01-08 DIAGNOSIS — D508 Other iron deficiency anemias: Secondary | ICD-10-CM

## 2024-01-08 DIAGNOSIS — E538 Deficiency of other specified B group vitamins: Secondary | ICD-10-CM

## 2024-01-08 LAB — CMP (CANCER CENTER ONLY)
ALT: 47 U/L — ABNORMAL HIGH (ref 0–44)
AST: 30 U/L (ref 15–41)
Albumin: 4.1 g/dL (ref 3.5–5.0)
Alkaline Phosphatase: 59 U/L (ref 38–126)
Anion gap: 1 — ABNORMAL LOW (ref 5–15)
BUN: 11 mg/dL (ref 6–20)
CO2: 26 mmol/L (ref 22–32)
Calcium: 8.8 mg/dL — ABNORMAL LOW (ref 8.9–10.3)
Chloride: 109 mmol/L (ref 98–111)
Creatinine: 0.69 mg/dL (ref 0.44–1.00)
GFR, Estimated: >60 mL/min (ref >60)
Glucose, Bld: 84 mg/dL (ref 70–99)
Potassium: 4.9 mmol/L (ref 3.5–5.1)
Sodium: 136 mmol/L (ref 135–145)
Total Bilirubin: 0.2 mg/dL (ref 0.0–1.2)
Total Protein: 7.5 g/dL (ref 6.5–8.1)

## 2024-01-08 LAB — CBC WITH DIFFERENTIAL (CANCER CENTER ONLY)
Abs Immature Granulocytes: 0.01 10*3/uL (ref 0.00–0.07)
Basophils Absolute: 0 10*3/uL (ref 0.0–0.1)
Basophils Relative: 1 %
Eosinophils Absolute: 0.3 10*3/uL (ref 0.0–0.5)
Eosinophils Relative: 3 %
HCT: 39.7 % (ref 36.0–46.0)
Hemoglobin: 12.7 g/dL (ref 12.0–15.0)
Immature Granulocytes: 0 %
Lymphocytes Relative: 28 %
Lymphs Abs: 2.3 10*3/uL (ref 0.7–4.0)
MCH: 29.5 pg (ref 26.0–34.0)
MCHC: 32 g/dL (ref 30.0–36.0)
MCV: 92.3 fL (ref 80.0–100.0)
Monocytes Absolute: 0.6 10*3/uL (ref 0.1–1.0)
Monocytes Relative: 8 %
Neutro Abs: 5.1 10*3/uL (ref 1.7–7.7)
Neutrophils Relative %: 60 %
Platelet Count: 345 10*3/uL (ref 150–400)
RBC: 4.3 MIL/uL (ref 3.87–5.11)
RDW: 14.6 % (ref 11.5–15.5)
WBC Count: 8.3 10*3/uL (ref 4.0–10.5)
nRBC: 0 % (ref 0.0–0.2)

## 2024-01-08 LAB — IRON AND IRON BINDING CAPACITY (CC-WL,HP ONLY)
Iron: 43 ug/dL (ref 28–170)
Saturation Ratios: 12 % (ref 10.4–31.8)
TIBC: 351 ug/dL (ref 250–450)
UIBC: 308 ug/dL (ref 148–442)

## 2024-01-08 MED ORDER — CYANOCOBALAMIN 1000 MCG/ML IJ SOLN
1000.0000 ug | Freq: Once | INTRAMUSCULAR | Status: AC
  Administered 2024-01-08: 1000 ug via INTRAMUSCULAR
  Filled 2024-01-08: qty 1

## 2024-01-08 NOTE — Progress Notes (Signed)
 [facility] Cancer Follow up:    Patient, No Pcp Per No address on file   DIAGNOSIS: Iron deficiency anemia, B12 deficiency  SUMMARY OF HEMATOLOGIC HISTORY: S/p gastric bypass surgery in approximately 2018 and became pregnant in 2023 Oral iron 65mg  elemental iron BID begain in 03/2022 (ferritin 6, sat 4%, iron 19, TIBC 521, hemoglobin 8.5, MCV 76.3) 05/09/2022 hemoglobin 7.9, MCV 75.2; Venofer 200mg  x 5  given beginning 05/15/2022  On 07/29/2022 her cbc was 8.3 and MCV improved to 81.4; ferritin on 10/31 6--was recommended to restart oral iron 08/10/2022 ferritin is 4--Venofer 200mg  x 5 began on 08/16/2022 Hemoglobin 09/19/2022 10.5, MCV 84.7 Venofer 200mg  x 4 beginning 12/01/2022 Ferritin 04/03/2023: 125  CURRENT THERAPY: Intermittent IV iron, B12 injections  INTERVAL HISTORY: Carrie Shaw 38 y.o. female returns for follow-up and evaluation.    Discussed the use of AI scribe software for clinical note transcription with the patient, who gave verbal consent to proceed.  History of Present Illness Carrie Shaw is a 38 year old female with iron deficiency anemia who presents for follow-up on iron and B12 supplementation.  She has been receiving iron infusions and B12 shots. She completed one iron infusion and is scheduled for the next one on April 11th. She has been receiving weekly B12 shots for four weeks, with today being the last weekly dose before transitioning to monthly shots. She feels better after the iron infusion, and her hemoglobin levels have improved.  She previously experienced ice cravings, which have resolved following the iron infusion. She has gained ten pounds over the past two months, although she has lost weight since her pregnancy. She is currently at 289 pounds, down from a peak of 345 pounds during pregnancy, but she wants to lose more weight.  No significant changes in her health since the last visit, aside from weight gain. Ice  cravings have disappeared.   Patient Active Problem List   Diagnosis Date Noted   History of gestational hypertension 10/16/2022   Postpartum care following vaginal delivery 09/18/2022   Benign essential HTN, chronic, antepartum, third trimester 09/16/2022   Medication management 09/05/2022   [redacted] weeks gestation of pregnancy 09/05/2022   Gestational hypertension, third trimester 09/05/2022   B12 deficiency 07/18/2022   Vitamin D deficiency 07/18/2022   Absolute anemia 07/18/2022   Other fatigue 07/04/2022   SOBOE (shortness of breath on exertion) 07/04/2022   [redacted] weeks gestation of pregnancy 07/04/2022   Weight gain following gastric bypass surgery 07/04/2022   Intestinal malabsorption following gastrectomy 07/04/2022   Depression 07/04/2022   IDA (iron deficiency anemia) 03/29/2022   Postpartum hypertension 08/29/2017   Intraoperative bladder injury 08/17/2017   Pelvic peritoneal adhesions, female 08/17/2017   Ectopic pregnancy without intrauterine pregnancy 08/16/2017   Morbid obesity (HCC) with starting BMI 43 04/23/2013    has no known allergies.  MEDICAL HISTORY: Past Medical History:  Diagnosis Date   ADD (attention deficit disorder)    Anemia    Anxiety    B12 deficiency    Back pain    Bilateral swelling of feet    Depression    Gallbladder problem    HSV infection    Hypertension    Joint pain    Lower extremity edema    Obesity    Pregnancy induced hypertension    Sleep apnea    mild no mask   SOB (shortness of breath)    SVD (spontaneous vaginal delivery)    x 2  Termination of pregnancy (fetus)    x 2   UTI (urinary tract infection)    Vitamin B12 deficiency    Vitamin D deficiency     SURGICAL HISTORY: Past Surgical History:  Procedure Laterality Date   APPENDECTOMY     CYSTOSCOPY N/A 08/16/2017   Procedure: CYSTOSCOPY;  Surgeon: Conan Bowens, MD;  Location: [facility];  Service: Gynecology;  Laterality: N/A;   GASTRIC ROUX-EN-Y N/A 01/01/2018    Procedure: LAPAROSCOPIC ROUX-EN-Y GASTRIC BYPASS WITH UPPER ENDOSCOPY;  Surgeon: Gaynelle Adu, MD;  Location: [facility];  Service: General;  Laterality: N/A;   LAPAROSCOPY N/A 08/16/2017   Procedure: LAPAROSCOPY DIAGNOSTIC;  Surgeon: Conan Bowens, MD;  Location: [facility];  Service: Gynecology;  Laterality: N/A;   LAPAROTOMY N/A 08/16/2017   Procedure: EXPLORATORY LAPAROTOMY WITH REMOVAL OF LEFT ECTOPIC PREGNANCY, LEFT SALPINGECTOMY AND CYSTOTOMY OF BLADDER;  Surgeon: Conan Bowens, MD;  Location: [facility];  Service: Gynecology;  Laterality: N/A;   LYSIS OF ADHESION N/A 08/16/2017   Procedure: LYSIS OF ADHESION;  Surgeon: Conan Bowens, MD;  Location: [facility];  Service: Gynecology;  Laterality: N/A;   Tummy tuck     10/2019   UNILATERAL SALPINGECTOMY Left 08/16/2017   Procedure: UNILATERAL SALPINGECTOMY;  Surgeon: Conan Bowens, MD;  Location: [facility];  Service: Gynecology;  Laterality: Left;    SOCIAL HISTORY: Social History   Socioeconomic History   Marital status: Married    Spouse name: Fraser Din   Number of children: 2   Years of education: Not on file   Highest education level: Not on file  Occupational History   Occupation: Clinical biochemist Rep  Tobacco Use   Smoking status: Former    Current packs/day: 0.00    Average packs/day: 0.3 packs/day for 10.0 years (2.5 ttl pk-yrs)    Types: Cigarettes    Start date: 12/2011    Quit date: 11/2021    Years since quitting: 2.1   Smokeless tobacco: Never  Vaping Use   Vaping status: Never Used  Substance and Sexual Activity   Alcohol use: Not Currently    Comment: rare   Drug use: No   Sexual activity: Yes    Partners: Male    Birth control/protection: None  Other Topics Concern   Not on file  Social History Narrative   She works at [employer]    Two children   Not married       She likes to shop and travel    Social Drivers of Corporate investment banker Strain: Not on file  Food Insecurity: No Food Insecurity  (09/16/2022)   Hunger Vital Sign    Worried About Running Out of Food in the Last Year: Never true    Ran Out of Food in the Last Year: Never true  Transportation Needs: No Transportation Needs (09/16/2022)   PRAPARE - Administrator, Civil Service (Medical): No    Lack of Transportation (Non-Medical): No  Physical Activity: Not on file  Stress: Not on file  Social Connections: Not on file  Intimate Partner Violence: Not At Risk (09/16/2022)   Humiliation, Afraid, Rape, and Kick questionnaire    Fear of Current or Ex-Partner: No    Emotionally Abused: No    Physically Abused: No    Sexually Abused: No    FAMILY HISTORY: Family History  Problem Relation Age of Onset   Depression Mother    Hypertension Mother    Anxiety disorder Mother  Sleep apnea Mother    Obesity Mother    Eating disorder Mother    Hypertension Father    Depression Father    Anxiety disorder Father    Sleep apnea Father    Alcoholism Father    Drug abuse Father    Obesity Father    Schizophrenia Sister    Bipolar disorder Sister     Review of Systems  Constitutional:  Positive for fatigue. Negative for appetite change, chills, fever and unexpected weight change.  HENT:   Negative for hearing loss, lump/mass and trouble swallowing.   Eyes:  Negative for eye problems and icterus.  Respiratory:  Negative for chest tightness, cough and shortness of breath.   Cardiovascular:  Negative for chest pain, leg swelling and palpitations.  Gastrointestinal:  Negative for abdominal distention, abdominal pain, constipation, diarrhea, nausea and vomiting.  Endocrine: Negative for hot flashes.  Genitourinary:  Negative for difficulty urinating.   Musculoskeletal:  Negative for arthralgias.  Skin:  Negative for itching and rash.  Neurological:  Negative for dizziness, extremity weakness, headaches and numbness.  Hematological:  Negative for adenopathy. Does not bruise/bleed easily.   Psychiatric/Behavioral:  Negative for depression. The patient is not nervous/anxious.       PHYSICAL EXAMINATION    Vitals:   01/08/24 1524  BP: (!) 141/60  Pulse: 72  Resp: 17  Temp: (!) 97.4 F (36.3 C)  SpO2: 100%   Physical Exam Constitutional:      Appearance: Normal appearance.  Cardiovascular:     Rate and Rhythm: Normal rate and regular rhythm.     Pulses: Normal pulses.     Heart sounds: Normal heart sounds.  Pulmonary:     Effort: Pulmonary effort is normal.     Breath sounds: Normal breath sounds.  Abdominal:     General: Abdomen is flat.     Palpations: Abdomen is soft.  Musculoskeletal:        General: No swelling. Normal range of motion.     Cervical back: Normal range of motion. No rigidity.  Lymphadenopathy:     Cervical: No cervical adenopathy.  Skin:    General: Skin is warm and dry.  Neurological:     Mental Status: She is alert.       LABORATORY DATA:  CBC    Component Value Date/Time   WBC 8.3 01/08/2024 1507   WBC 7.3 05/01/2023 1103   RBC 4.30 01/08/2024 1507   HGB 12.7 01/08/2024 1507   HGB 9.8 (L) 09/13/2022 1027   HCT 39.7 01/08/2024 1507   HCT 32.0 (L) 09/13/2022 1027   PLT 345 01/08/2024 1507   PLT 281 09/13/2022 1027   MCV 92.3 01/08/2024 1507   MCV 86 09/13/2022 1027   MCH 29.5 01/08/2024 1507   MCHC 32.0 01/08/2024 1507   RDW 14.6 01/08/2024 1507   RDW 21.8 (H) 09/13/2022 1027   LYMPHSABS 2.3 01/08/2024 1507   LYMPHSABS 2.0 01/06/2020 1103   MONOABS 0.6 01/08/2024 1507   EOSABS 0.3 01/08/2024 1507   EOSABS 0.1 01/06/2020 1103   BASOSABS 0.0 01/08/2024 1507   BASOSABS 0.0 01/06/2020 1103     ASSESSMENT and THERAPY PLAN:   IDA (iron deficiency anemia) Assessment and Plan Assessment & Plan Iron deficiency anemia Undergoing iron infusion therapy with improved hemoglobin levels and symptomatic relief. Further improvement expected with continued treatment. - Administer iron infusion on April 11th. - Schedule  and administer the third iron infusion. - Repeat blood work in three months to assess  hemoglobin levels.  Vitamin B12 deficiency Completing a four-week course of weekly B12 injections. Transitioning to monthly injections, prefers clinic administration. - Administer the last weekly B12 injection today. - Transition to monthly B12 injections. - Schedule B12 injection appointments at checkout.  Weight management Gained 10 pounds over the past two months. Aims to reach pre-pregnancy weight. -If she continues to have difficulty losing weight, we can consider referral to weight loss clinic  Follow-up Follow-up in three months for blood work to evaluate iron and B12 treatment efficacy. - Schedule follow-up appointment in three months for blood work. - Ensure she keeps appointment with Mardella Layman.     All questions were answered. The patient knows to call the clinic with any problems, questions or concerns. We can certainly see the patient much sooner if necessary.  Total encounter time:30 minutes*in face-to-face visit time, chart review, lab review, care coordination, order entry, and documentation of the encounter time.   *Total Encounter Time as defined by the Centers for Medicare and Medicaid Services includes, in addition to the face-to-face time of a patient visit (documented in the note above) non-face-to-face time: obtaining and reviewing outside history, ordering and reviewing medications, tests or procedures, care coordination (communications with other health care professionals or caregivers) and documentation in the medical record.

## 2024-01-08 NOTE — Patient Instructions (Signed)
 Vitamin B12 Injection What is this medication? Vitamin B12 (VAHY tuh min B12) prevents and treats low vitamin B12 levels in your body. It is used in people who do not get enough vitamin B12 from their diet or when their digestive tract does not absorb enough. Vitamin B12 plays an important role in maintaining the health of your nervous system and red blood cells. This medicine may be used for other purposes; ask your health care provider or pharmacist if you have questions. COMMON BRAND NAME(S): B-12 Compliance Kit, B-12 Injection Kit, Cyomin, Dodex, LA-12, Nutri-Twelve, Physicians EZ Use B-12, Primabalt, Vitamin Deficiency Injectable System - B12 What should I tell my care team before I take this medication? They need to know if you have any of these conditions: Kidney disease Leber's disease Megaloblastic anemia An unusual or allergic reaction to cyanocobalamin, cobalt, other medications, foods, dyes, or preservatives Pregnant or trying to get pregnant Breast-feeding How should I use this medication? This medication is injected into a muscle or deeply under the skin. It is usually given in a clinic or care team's office. However, your care team may teach you how to inject yourself. Follow all instructions. Talk to your care team about the use of this medication in children. Special care may be needed. Overdosage: If you think you have taken too much of this medicine contact a poison control center or emergency room at once. NOTE: This medicine is only for you. Do not share this medicine with others. What if I miss a dose? If you are given your dose at a clinic or care team's office, call to reschedule your appointment. If you give your own injections, and you miss a dose, take it as soon as you can. If it is almost time for your next dose, take only that dose. Do not take double or extra doses. What may interact with this medication? Alcohol Colchicine This list may not describe all possible  interactions. Give your health care provider a list of all the medicines, herbs, non-prescription drugs, or dietary supplements you use. Also tell them if you smoke, drink alcohol, or use illegal drugs. Some items may interact with your medicine. What should I watch for while using this medication? Visit your care team regularly. You may need blood work done while you are taking this medication. You may need to follow a special diet. Talk to your care team. Limit your alcohol intake and avoid smoking to get the best benefit. What side effects may I notice from receiving this medication? Side effects that you should report to your care team as soon as possible: Allergic reactions--skin rash, itching, hives, swelling of the face, lips, tongue, or throat Swelling of the ankles, hands, or feet Trouble breathing Side effects that usually do not require medical attention (report to your care team if they continue or are bothersome): Diarrhea This list may not describe all possible side effects. Call your doctor for medical advice about side effects. You may report side effects to FDA at 1-800-FDA-1088. Where should I keep my medication? Keep out of the reach of children. Store at room temperature between 15 and 30 degrees C (59 and 85 degrees F). Protect from light. Throw away any unused medication after the expiration date. NOTE: This sheet is a summary. It may not cover all possible information. If you have questions about this medicine, talk to your doctor, pharmacist, or health care provider.  2024 Elsevier/Gold Standard (2021-05-31 00:00:00)

## 2024-01-08 NOTE — Assessment & Plan Note (Signed)
 Assessment and Plan Assessment & Plan Iron deficiency anemia Undergoing iron infusion therapy with improved hemoglobin levels and symptomatic relief. Further improvement expected with continued treatment. - Administer iron infusion on April 11th. - Schedule and administer the third iron infusion. - Repeat blood work in three months to assess hemoglobin levels.  Vitamin B12 deficiency Completing a four-week course of weekly B12 injections. Transitioning to monthly injections, prefers clinic administration. - Administer the last weekly B12 injection today. - Transition to monthly B12 injections. - Schedule B12 injection appointments at checkout.  Weight management Gained 10 pounds over the past two months. Aims to reach pre-pregnancy weight. -If she continues to have difficulty losing weight, we can consider referral to weight loss clinic  Follow-up Follow-up in three months for blood work to evaluate iron and B12 treatment efficacy. - Schedule follow-up appointment in three months for blood work. - Ensure she keeps appointment with Mardella Layman.

## 2024-01-09 LAB — FERRITIN: Ferritin: 84 ng/mL (ref 11–307)

## 2024-01-11 ENCOUNTER — Inpatient Hospital Stay

## 2024-01-11 VITALS — BP 139/80 | HR 62 | Temp 97.8°F | Resp 18

## 2024-01-11 DIAGNOSIS — E538 Deficiency of other specified B group vitamins: Secondary | ICD-10-CM | POA: Diagnosis not present

## 2024-01-11 DIAGNOSIS — D508 Other iron deficiency anemias: Secondary | ICD-10-CM

## 2024-01-11 MED ORDER — SODIUM CHLORIDE 0.9 % IV SOLN: INTRAVENOUS | Status: DC

## 2024-01-11 MED ORDER — SODIUM CHLORIDE 0.9 % IV SOLN
300.0000 mg | Freq: Once | INTRAVENOUS | Status: AC
  Administered 2024-01-11: 300 mg via INTRAVENOUS
  Filled 2024-01-11: qty 300

## 2024-01-11 NOTE — Progress Notes (Signed)
Pt declined 30 minute observation following Venofer infusion. Pt tolerated Tx well w/out incident. VSS at discharge.  Ambulatory to lobby.

## 2024-01-11 NOTE — Patient Instructions (Signed)

## 2024-01-15 ENCOUNTER — Other Ambulatory Visit (INDEPENDENT_AMBULATORY_CARE_PROVIDER_SITE_OTHER): Payer: Self-pay | Admitting: Adult Health

## 2024-01-18 ENCOUNTER — Inpatient Hospital Stay

## 2024-01-18 VITALS — BP 126/68 | HR 67 | Temp 98.1°F | Resp 16

## 2024-01-18 DIAGNOSIS — D508 Other iron deficiency anemias: Secondary | ICD-10-CM

## 2024-01-18 DIAGNOSIS — E538 Deficiency of other specified B group vitamins: Secondary | ICD-10-CM | POA: Diagnosis not present

## 2024-01-18 MED ORDER — SODIUM CHLORIDE 0.9 % IV SOLN: INTRAVENOUS | Status: DC

## 2024-01-18 MED ORDER — SODIUM CHLORIDE 0.9 % IV SOLN
300.0000 mg | Freq: Once | INTRAVENOUS | Status: AC
  Administered 2024-01-18: 300 mg via INTRAVENOUS
  Filled 2024-01-18: qty 300

## 2024-01-18 NOTE — Patient Instructions (Signed)

## 2024-02-05 ENCOUNTER — Inpatient Hospital Stay: Payer: Commercial Managed Care - HMO | Attending: Hematology and Oncology

## 2024-02-05 ENCOUNTER — Other Ambulatory Visit: Payer: Self-pay | Admitting: *Deleted

## 2024-02-05 DIAGNOSIS — E538 Deficiency of other specified B group vitamins: Secondary | ICD-10-CM | POA: Diagnosis present

## 2024-02-05 DIAGNOSIS — D508 Other iron deficiency anemias: Secondary | ICD-10-CM

## 2024-02-05 MED ORDER — CYANOCOBALAMIN 1000 MCG/ML IJ SOLN
1000.0000 ug | Freq: Once | INTRAMUSCULAR | Status: AC
  Administered 2024-02-05: 1000 ug via INTRAMUSCULAR
  Filled 2024-02-05: qty 1

## 2024-02-05 MED ORDER — CYANOCOBALAMIN 1000 MCG/ML IJ SOLN: 1000.0000 ug | Freq: Once | INTRAMUSCULAR | Status: DC

## 2024-02-06 ENCOUNTER — Other Ambulatory Visit: Payer: Self-pay | Admitting: Pharmacist

## 2024-02-17 ENCOUNTER — Emergency Department (HOSPITAL_COMMUNITY)
Admission: EM | Admit: 2024-02-17 | Discharge: 2024-02-17 | Disposition: A | Discharge status: Home / Self Care | Attending: Emergency Medicine | Admitting: Emergency Medicine

## 2024-02-17 ENCOUNTER — Other Ambulatory Visit: Payer: Self-pay

## 2024-02-17 ENCOUNTER — Encounter (HOSPITAL_COMMUNITY): Payer: Self-pay | Admitting: Emergency Medicine

## 2024-02-17 DIAGNOSIS — M542 Cervicalgia: Secondary | ICD-10-CM | POA: Diagnosis not present

## 2024-02-17 DIAGNOSIS — Y92410 Unspecified street and highway as the place of occurrence of the external cause: Secondary | ICD-10-CM | POA: Insufficient documentation

## 2024-02-17 DIAGNOSIS — M25551 Pain in right hip: Secondary | ICD-10-CM | POA: Diagnosis not present

## 2024-02-17 DIAGNOSIS — M25511 Pain in right shoulder: Secondary | ICD-10-CM | POA: Insufficient documentation

## 2024-02-17 DIAGNOSIS — M791 Myalgia, unspecified site: Secondary | ICD-10-CM

## 2024-02-17 DIAGNOSIS — R519 Headache, unspecified: Secondary | ICD-10-CM | POA: Diagnosis present

## 2024-02-17 MED ORDER — METHOCARBAMOL 500 MG PO TABS
500.0000 mg | ORAL_TABLET | Freq: Four times a day (QID) | ORAL | 0 refills | Status: DC
Start: 2024-02-17 — End: 2024-04-07
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
## 2024-02-18 ENCOUNTER — Inpatient Hospital Stay

## 2024-03-04 ENCOUNTER — Inpatient Hospital Stay: Payer: Commercial Managed Care - HMO

## 2024-03-10 ENCOUNTER — Other Ambulatory Visit: Payer: Self-pay | Admitting: Surgery

## 2024-03-10 DIAGNOSIS — M25552 Pain in left hip: Secondary | ICD-10-CM

## 2024-03-10 DIAGNOSIS — M542 Cervicalgia: Secondary | ICD-10-CM

## 2024-03-19 ENCOUNTER — Inpatient Hospital Stay: Attending: Hematology and Oncology

## 2024-03-19 DIAGNOSIS — D508 Other iron deficiency anemias: Secondary | ICD-10-CM

## 2024-03-19 DIAGNOSIS — E538 Deficiency of other specified B group vitamins: Secondary | ICD-10-CM | POA: Insufficient documentation

## 2024-03-19 MED ORDER — CYANOCOBALAMIN 1000 MCG/ML IJ SOLN
1000.0000 ug | Freq: Once | INTRAMUSCULAR | Status: AC
  Administered 2024-03-19: 1000 ug via INTRAMUSCULAR
  Filled 2024-03-19: qty 1

## 2024-03-24 ENCOUNTER — Other Ambulatory Visit: Payer: Self-pay | Admitting: Surgery

## 2024-03-24 DIAGNOSIS — M25552 Pain in left hip: Secondary | ICD-10-CM

## 2024-03-24 DIAGNOSIS — M542 Cervicalgia: Secondary | ICD-10-CM

## 2024-03-27 ENCOUNTER — Ambulatory Visit
Admission: RE | Admit: 2024-03-27 | Discharge: 2024-03-27 | Disposition: A | Discharge status: Home / Self Care | Source: Ambulatory Visit | Attending: Surgery | Admitting: Surgery

## 2024-03-27 ENCOUNTER — Encounter: Payer: Self-pay | Admitting: Hematology and Oncology

## 2024-03-27 DIAGNOSIS — M542 Cervicalgia: Secondary | ICD-10-CM

## 2024-03-27 DIAGNOSIS — M25552 Pain in left hip: Secondary | ICD-10-CM

## 2024-04-01 ENCOUNTER — Encounter: Payer: Self-pay | Admitting: Hematology and Oncology

## 2024-04-07 ENCOUNTER — Inpatient Hospital Stay: Payer: Commercial Managed Care - HMO

## 2024-04-07 ENCOUNTER — Inpatient Hospital Stay (HOSPITAL_BASED_OUTPATIENT_CLINIC_OR_DEPARTMENT_OTHER): Payer: Commercial Managed Care - HMO | Admitting: Adult Health

## 2024-04-07 ENCOUNTER — Ambulatory Visit

## 2024-04-07 ENCOUNTER — Inpatient Hospital Stay

## 2024-04-07 ENCOUNTER — Encounter: Payer: Self-pay | Admitting: Adult Health

## 2024-04-07 ENCOUNTER — Inpatient Hospital Stay: Payer: Commercial Managed Care - HMO | Attending: Hematology and Oncology

## 2024-04-07 VITALS — BP 137/62 | HR 55 | Temp 97.8°F | Resp 17 | Wt 299.3 lb

## 2024-04-07 DIAGNOSIS — D508 Other iron deficiency anemias: Secondary | ICD-10-CM

## 2024-04-07 DIAGNOSIS — E611 Iron deficiency: Secondary | ICD-10-CM | POA: Diagnosis not present

## 2024-04-07 DIAGNOSIS — Z9884 Bariatric surgery status: Secondary | ICD-10-CM | POA: Insufficient documentation

## 2024-04-07 DIAGNOSIS — D509 Iron deficiency anemia, unspecified: Secondary | ICD-10-CM

## 2024-04-07 DIAGNOSIS — E559 Vitamin D deficiency, unspecified: Secondary | ICD-10-CM | POA: Diagnosis not present

## 2024-04-07 DIAGNOSIS — E538 Deficiency of other specified B group vitamins: Secondary | ICD-10-CM

## 2024-04-07 LAB — CBC WITH DIFFERENTIAL (CANCER CENTER ONLY)
Abs Immature Granulocytes: 0.03 K/uL (ref 0.00–0.07)
Basophils Absolute: 0 K/uL (ref 0.0–0.1)
Basophils Relative: 1 %
Eosinophils Absolute: 0.2 K/uL (ref 0.0–0.5)
Eosinophils Relative: 3 %
HCT: 37.6 % (ref 36.0–46.0)
Hemoglobin: 12.2 g/dL (ref 12.0–15.0)
Immature Granulocytes: 1 %
Lymphocytes Relative: 30 %
Lymphs Abs: 1.9 K/uL (ref 0.7–4.0)
MCH: 30 pg (ref 26.0–34.0)
MCHC: 32.4 g/dL (ref 30.0–36.0)
MCV: 92.6 fL (ref 80.0–100.0)
Monocytes Absolute: 0.5 K/uL (ref 0.1–1.0)
Monocytes Relative: 8 %
Neutro Abs: 3.7 K/uL (ref 1.7–7.7)
Neutrophils Relative %: 57 %
Platelet Count: 277 K/uL (ref 150–400)
RBC: 4.06 MIL/uL (ref 3.87–5.11)
RDW: 14.2 % (ref 11.5–15.5)
WBC Count: 6.4 K/uL (ref 4.0–10.5)
nRBC: 0 % (ref 0.0–0.2)

## 2024-04-07 LAB — IRON AND IRON BINDING CAPACITY (CC-WL,HP ONLY)
Iron: 82 ug/dL (ref 28–170)
Saturation Ratios: 27 % (ref 10.4–31.8)
TIBC: 308 ug/dL (ref 250–450)
UIBC: 226 ug/dL (ref 148–442)

## 2024-04-07 LAB — VITAMIN D 25 HYDROXY (VIT D DEFICIENCY, FRACTURES): Vit D, 25-Hydroxy: 39.81 ng/mL (ref 30–100)

## 2024-04-07 LAB — VITAMIN B12: Vitamin B-12: 1610 pg/mL — ABNORMAL HIGH (ref 180–914)

## 2024-04-07 LAB — FERRITIN: Ferritin: 143 ng/mL (ref 11–307)

## 2024-04-07 MED ORDER — CYANOCOBALAMIN 1000 MCG/ML IJ SOLN
1000.0000 ug | Freq: Once | INTRAMUSCULAR | Status: AC
  Administered 2024-04-07: 1000 ug via INTRAMUSCULAR
  Filled 2024-04-07: qty 1

## 2024-04-07 NOTE — Progress Notes (Signed)
 [facility] Cancer Follow up:    Patient, No Pcp Per No address on file   DIAGNOSIS: IRON  AND B12 DEFICIENCY   SUMMARY OF HEMATOLOGIC HISTORY: S/p gastric bypass surgery in approximately 2018 and became pregnant in 2023 Oral iron  65mg  elemental iron  BID begain in 03/2022 (ferritin 6, sat 4%, iron  19, TIBC 521, hemoglobin 8.5, MCV 76.3) 05/09/2022 hemoglobin 7.9, MCV 75.2; Venofer  200mg  x 5  given beginning 05/15/2022  On 07/29/2022 her cbc was 8.3 and MCV improved to 81.4; ferritin on 10/31 6--was recommended to restart oral iron  08/10/2022 ferritin is 4--Venofer  200mg  x 5 began on 08/16/2022 Hemoglobin 09/19/2022 10.5, MCV 84.7 Venofer  200mg  x 4 beginning 12/01/2022 Ferritin 04/03/2023: 125  CURRENT THERAPY:Intermittent IV iron , B12 injections   INTERVAL HISTORY:  Discussed the use of AI scribe software for clinical note transcription with the patient, who gave verbal consent to proceed.  History of Present Illness Carrie Shaw is a 38 year old female with iron  deficiency and B12 deficiency who presents for follow-up.  She receives monthly B12 injections and continues to experience fatigue and low energy levels. Her blood work today included a CBC, with a hemoglobin level of 12.2 g/dL. She last received iron  treatment in April and has been receiving B12 injections monthly, with the most recent injections in May and June. She is scheduled to receive another injection today.     Patient Active Problem List   Diagnosis Date Noted   History of gestational hypertension 10/16/2022   Postpartum care following vaginal delivery 09/18/2022   Benign essential HTN, chronic, antepartum, third trimester 09/16/2022   Medication management 09/05/2022   [redacted] weeks gestation of pregnancy 09/05/2022   Gestational hypertension, third trimester 09/05/2022   B12 deficiency 07/18/2022   Vitamin D  deficiency 07/18/2022   Absolute anemia 07/18/2022   Other fatigue 07/04/2022    SOBOE (shortness of breath on exertion) 07/04/2022   [redacted] weeks gestation of pregnancy 07/04/2022   Weight gain following gastric bypass surgery 07/04/2022   Intestinal malabsorption following gastrectomy 07/04/2022   Depression 07/04/2022   IDA (iron  deficiency anemia) 03/29/2022   Postpartum hypertension 08/29/2017   Intraoperative bladder injury 08/17/2017   Pelvic peritoneal adhesions, female 08/17/2017   Ectopic pregnancy without intrauterine pregnancy 08/16/2017   Morbid obesity (HCC) with starting BMI 43 04/23/2013    has no known allergies.  MEDICAL HISTORY: Past Medical History:  Diagnosis Date   ADD (attention deficit disorder)    Anemia    Anxiety    B12 deficiency    Back pain    Bilateral swelling of feet    Depression    Gallbladder problem    HSV infection    Hypertension    Joint pain    Lower extremity edema    Obesity    Pregnancy induced hypertension    Sleep apnea    mild no mask   SOB (shortness of breath)    SVD (spontaneous vaginal delivery)    x 2   Termination of pregnancy (fetus)    x 2   UTI (urinary tract infection)    Vitamin B12 deficiency    Vitamin D  deficiency     SURGICAL HISTORY: Past Surgical History:  Procedure Laterality Date   APPENDECTOMY     CYSTOSCOPY N/A 08/16/2017   Procedure: CYSTOSCOPY;  Surgeon: Nicholaus Burnard HERO, MD;  Location: [facility];  Service: Gynecology;  Laterality: N/A;   GASTRIC ROUX-EN-Y N/A 01/01/2018   Procedure: LAPAROSCOPIC ROUX-EN-Y GASTRIC BYPASS WITH UPPER  ENDOSCOPY;  Surgeon: Tanda Locus, MD;  Location: [facility];  Service: General;  Laterality: N/A;   LAPAROSCOPY N/A 08/16/2017   Procedure: LAPAROSCOPY DIAGNOSTIC;  Surgeon: Nicholaus Burnard HERO, MD;  Location: [facility];  Service: Gynecology;  Laterality: N/A;   LAPAROTOMY N/A 08/16/2017   Procedure: EXPLORATORY LAPAROTOMY WITH REMOVAL OF LEFT ECTOPIC PREGNANCY, LEFT SALPINGECTOMY AND CYSTOTOMY OF BLADDER;  Surgeon: Nicholaus Burnard HERO, MD;  Location: [facility];  Service:  Gynecology;  Laterality: N/A;   LYSIS OF ADHESION N/A 08/16/2017   Procedure: LYSIS OF ADHESION;  Surgeon: Nicholaus Burnard HERO, MD;  Location: [facility];  Service: Gynecology;  Laterality: N/A;   Tummy tuck     10/2019   UNILATERAL SALPINGECTOMY Left 08/16/2017   Procedure: UNILATERAL SALPINGECTOMY;  Surgeon: Nicholaus Burnard HERO, MD;  Location: [facility];  Service: Gynecology;  Laterality: Left;    SOCIAL HISTORY: Social History   Socioeconomic History   Marital status: Married    Spouse name: Bonnie   Number of children: 2   Years of education: Not on file   Highest education level: Not on file  Occupational History   Occupation: Clinical biochemist Rep  Tobacco Use   Smoking status: Former    Current packs/day: 0.00    Average packs/day: 0.3 packs/day for 10.0 years (2.5 ttl pk-yrs)    Types: Cigarettes    Start date: 12/2011    Quit date: 11/2021    Years since quitting: 2.3   Smokeless tobacco: Never  Vaping Use   Vaping status: Never Used  Substance and Sexual Activity   Alcohol use: Not Currently    Comment: rare   Drug use: No   Sexual activity: Yes    Partners: Male    Birth control/protection: None  Other Topics Concern   Not on file  Social History Narrative   She works at [employer]    Two children   Not married       She likes to shop and travel    Social Drivers of Corporate investment banker Strain: Not on file  Food Insecurity: No Food Insecurity (09/16/2022)   Hunger Vital Sign    Worried About Running Out of Food in the Last Year: Never true    Ran Out of Food in the Last Year: Never true  Transportation Needs: No Transportation Needs (09/16/2022)   PRAPARE - Administrator, Civil Service (Medical): No    Lack of Transportation (Non-Medical): No  Physical Activity: Not on file  Stress: Not on file  Social Connections: Not on file  Intimate Partner Violence: Not At Risk (09/16/2022)   Humiliation, Afraid, Rape, and Kick questionnaire    Fear of  Current or Ex-Partner: No    Emotionally Abused: No    Physically Abused: No    Sexually Abused: No    FAMILY HISTORY: Family History  Problem Relation Age of Onset   Depression Mother    Hypertension Mother    Anxiety disorder Mother    Sleep apnea Mother    Obesity Mother    Eating disorder Mother    Hypertension Father    Depression Father    Anxiety disorder Father    Sleep apnea Father    Alcoholism Father    Drug abuse Father    Obesity Father    Schizophrenia Sister    Bipolar disorder Sister     Review of Systems  Constitutional:  Positive for fatigue. Negative for appetite change, chills, fever and  unexpected weight change.  HENT:   Negative for hearing loss, lump/mass and trouble swallowing.   Eyes:  Negative for eye problems and icterus.  Respiratory:  Negative for chest tightness, cough and shortness of breath.   Cardiovascular:  Negative for chest pain, leg swelling and palpitations.  Gastrointestinal:  Negative for abdominal distention, abdominal pain, constipation, diarrhea, nausea and vomiting.  Endocrine: Negative for hot flashes.  Genitourinary:  Negative for difficulty urinating.   Musculoskeletal:  Negative for arthralgias.  Skin:  Negative for itching and rash.  Neurological:  Negative for dizziness, extremity weakness, headaches and numbness.  Hematological:  Negative for adenopathy. Does not bruise/bleed easily.  Psychiatric/Behavioral:  Negative for depression. The patient is not nervous/anxious.       PHYSICAL EXAMINATION    Vitals:   04/07/24 0901  BP: 137/62  Pulse: (!) 55  Resp: 17  Temp: 97.8 F (36.6 C)  SpO2: 100%    Physical Exam Constitutional:      General: She is not in acute distress.    Appearance: Normal appearance. She is not toxic-appearing.  HENT:     Head: Normocephalic and atraumatic.     Mouth/Throat:     Mouth: Mucous membranes are moist.     Pharynx: Oropharynx is clear. No oropharyngeal exudate or  posterior oropharyngeal erythema.  Eyes:     General: No scleral icterus. Cardiovascular:     Rate and Rhythm: Normal rate and regular rhythm.     Pulses: Normal pulses.     Heart sounds: Normal heart sounds.  Pulmonary:     Effort: Pulmonary effort is normal.     Breath sounds: Normal breath sounds.  Abdominal:     General: Abdomen is flat. Bowel sounds are normal. There is no distension.     Palpations: Abdomen is soft.     Tenderness: There is no abdominal tenderness.  Musculoskeletal:        General: No swelling.     Cervical back: Neck supple.  Lymphadenopathy:     Cervical: No cervical adenopathy.  Skin:    General: Skin is warm and dry.     Findings: No rash.  Neurological:     General: No focal deficit present.     Mental Status: She is alert.  Psychiatric:        Mood and Affect: Mood normal.        Behavior: Behavior normal.     LABORATORY DATA:  CBC    Component Value Date/Time   WBC 6.4 04/07/2024 0831   WBC 7.3 05/01/2023 1103   RBC 4.06 04/07/2024 0831   HGB 12.2 04/07/2024 0831   HGB 9.8 (L) 09/13/2022 1027   HCT 37.6 04/07/2024 0831   HCT 32.0 (L) 09/13/2022 1027   PLT 277 04/07/2024 0831   PLT 281 09/13/2022 1027   MCV 92.6 04/07/2024 0831   MCV 86 09/13/2022 1027   MCH 30.0 04/07/2024 0831   MCHC 32.4 04/07/2024 0831   RDW 14.2 04/07/2024 0831   RDW 21.8 (H) 09/13/2022 1027   LYMPHSABS 1.9 04/07/2024 0831   LYMPHSABS 2.0 01/06/2020 1103   MONOABS 0.5 04/07/2024 0831   EOSABS 0.2 04/07/2024 0831   EOSABS 0.1 01/06/2020 1103   BASOSABS 0.0 04/07/2024 0831   BASOSABS 0.0 01/06/2020 1103    CMP     Component Value Date/Time   NA 136 01/08/2024 1507   NA 138 04/03/2023 1220   K 4.9 01/08/2024 1507   CL 109 01/08/2024 1507   CO2  26 01/08/2024 1507   GLUCOSE 84 01/08/2024 1507   BUN 11 01/08/2024 1507   BUN 15 04/03/2023 1220   CREATININE 0.69 01/08/2024 1507   CREATININE 0.72 05/17/2016 1155   CALCIUM  8.8 (L) 01/08/2024 1507   PROT  7.5 01/08/2024 1507   PROT 7.8 04/03/2023 1220   ALBUMIN 4.1 01/08/2024 1507   ALBUMIN 4.6 04/03/2023 1220   AST 30 01/08/2024 1507   ALT 47 (H) 01/08/2024 1507   ALKPHOS 59 01/08/2024 1507   BILITOT 0.2 01/08/2024 1507   GFRNONAA >60 01/08/2024 1507   GFRNONAA >89 12/25/2014 1227   GFRAA 138 01/06/2020 1103   GFRAA >89 12/25/2014 1227     ASSESSMENT and THERAPY PLAN:   Assessment and Plan Assessment & Plan Iron  deficiency Hemoglobin 12.2, normal MCV, normal platelets, no microcytic anemia. - Order iron  level labs today if possible.  B12 deficiency Managed with monthly injections. Normal MCV, no macrocytic anemia. - Administer B12 injection today. - Continue monthly B12 injections. - Check b12 level today  Status post gastric bypass surgery Potential contributor to nutritional deficiencies. - Continue monitoring and supplementation.  Vitamin D  deficiency May contribute to fatigue. Last level check unknown. - Order vitamin D  level test today. - On OTC supplement  RTC as noted above, patient has f/u with Dr. Loretha in about 8 weeks time with labs prior.  All questions were answered. The patient knows to call the clinic with any problems, questions or concerns. We can certainly see the patient much sooner if necessary.  Total encounter time:20 minutes*in face-to-face visit time, chart review, lab review, care coordination, order entry, and documentation of the encounter time.    Morna Kendall, NP 04/07/24 9:11 AM Medical Oncology and Hematology [facility] [address] [place], [place] [address] Tel. [phone]    Fax. [phone]  *Total Encounter Time as defined by the Centers for Medicare and Medicaid Services includes, in addition to the face-to-face time of a patient visit (documented in the note above) non-face-to-face time: obtaining and reviewing outside history, ordering and reviewing medications, tests or procedures, care coordination  (communications with other health care professionals or caregivers) and documentation in the medical record.

## 2024-04-10 ENCOUNTER — Ambulatory Visit: Payer: Self-pay | Admitting: *Deleted

## 2024-04-10 NOTE — Telephone Encounter (Signed)
Per Lindsey Causey, NP, called pt with message below. Pt verbalized understanding.  

## 2024-04-10 NOTE — Telephone Encounter (Signed)
-----   Message from Morna JAYSON Kendall sent at 04/09/2024  7:12 PM EDT ----- Please let her know that her labs have improved which is great.  I recommend continuing on her treatment as scheduled.   ----- Message ----- From: Rebecka, Lab In [place] Sent: 04/07/2024  10:46 AM EDT To: Morna Dalton Kendall, NP

## 2024-04-17 ENCOUNTER — Inpatient Hospital Stay

## 2024-05-19 ENCOUNTER — Inpatient Hospital Stay: Payer: Self-pay

## 2024-06-17 ENCOUNTER — Telehealth: Payer: Self-pay

## 2024-06-17 NOTE — Telephone Encounter (Signed)
 Lynwood Balloon, PA called asking for you to reach out to patient to get patient in to see Dr Vernetta to discuss hip Referral in Media tab.

## 2024-06-19 ENCOUNTER — Inpatient Hospital Stay: Attending: Hematology and Oncology

## 2024-06-19 ENCOUNTER — Inpatient Hospital Stay: Payer: Self-pay | Admitting: Hematology and Oncology

## 2024-06-19 ENCOUNTER — Inpatient Hospital Stay: Payer: Self-pay

## 2024-06-19 ENCOUNTER — Inpatient Hospital Stay

## 2024-06-19 DIAGNOSIS — E538 Deficiency of other specified B group vitamins: Secondary | ICD-10-CM | POA: Diagnosis present

## 2024-06-19 DIAGNOSIS — D509 Iron deficiency anemia, unspecified: Secondary | ICD-10-CM

## 2024-06-19 DIAGNOSIS — D508 Other iron deficiency anemias: Secondary | ICD-10-CM

## 2024-06-19 LAB — CBC WITH DIFFERENTIAL (CANCER CENTER ONLY)
Abs Immature Granulocytes: 0.02 K/uL (ref 0.00–0.07)
Basophils Absolute: 0 K/uL (ref 0.0–0.1)
Basophils Relative: 0 %
Eosinophils Absolute: 0.1 K/uL (ref 0.0–0.5)
Eosinophils Relative: 1 %
HCT: 39.5 % (ref 36.0–46.0)
Hemoglobin: 12.8 g/dL (ref 12.0–15.0)
Immature Granulocytes: 0 %
Lymphocytes Relative: 20 %
Lymphs Abs: 2.1 K/uL (ref 0.7–4.0)
MCH: 30.3 pg (ref 26.0–34.0)
MCHC: 32.4 g/dL (ref 30.0–36.0)
MCV: 93.6 fL (ref 80.0–100.0)
Monocytes Absolute: 0.6 K/uL (ref 0.1–1.0)
Monocytes Relative: 6 %
Neutro Abs: 7.5 K/uL (ref 1.7–7.7)
Neutrophils Relative %: 73 %
Platelet Count: 301 K/uL (ref 150–400)
RBC: 4.22 MIL/uL (ref 3.87–5.11)
RDW: 14.1 % (ref 11.5–15.5)
WBC Count: 10.4 K/uL (ref 4.0–10.5)
nRBC: 0 % (ref 0.0–0.2)

## 2024-06-19 LAB — CMP (CANCER CENTER ONLY)
ALT: 34 U/L (ref 0–44)
AST: 24 U/L (ref 15–41)
Albumin: 4 g/dL (ref 3.5–5.0)
Alkaline Phosphatase: 56 U/L (ref 38–126)
Anion gap: 9 (ref 5–15)
BUN: 13 mg/dL (ref 6–20)
CO2: 23 mmol/L (ref 22–32)
Calcium: 9.1 mg/dL (ref 8.9–10.3)
Chloride: 106 mmol/L (ref 98–111)
Creatinine: 0.68 mg/dL (ref 0.44–1.00)
GFR, Estimated: >60 mL/min (ref >60)
Glucose, Bld: 114 mg/dL — ABNORMAL HIGH (ref 70–99)
Potassium: 4.8 mmol/L (ref 3.5–5.1)
Sodium: 137 mmol/L (ref 135–145)
Total Bilirubin: 0.5 mg/dL (ref 0.0–1.2)
Total Protein: 7.2 g/dL (ref 6.5–8.1)

## 2024-06-19 LAB — VITAMIN B12: Vitamin B-12: >4000 pg/mL — ABNORMAL HIGH (ref 180–914)

## 2024-06-19 LAB — IRON AND IRON BINDING CAPACITY (CC-WL,HP ONLY)
Iron: 101 ug/dL (ref 28–170)
Saturation Ratios: 29 % (ref 10.4–31.8)
TIBC: 344 ug/dL (ref 250–450)
UIBC: 243 ug/dL

## 2024-06-19 MED ORDER — CYANOCOBALAMIN 1000 MCG/ML IJ SOLN
1000.0000 ug | Freq: Once | INTRAMUSCULAR | Status: AC
  Administered 2024-06-19: 1000 ug via INTRAMUSCULAR
  Filled 2024-06-19: qty 1

## 2024-06-20 LAB — FERRITIN: Ferritin: 78 ng/mL (ref 11–307)

## 2024-06-30 ENCOUNTER — Encounter: Payer: Self-pay | Admitting: Adult Health

## 2024-07-01 ENCOUNTER — Other Ambulatory Visit: Payer: Self-pay

## 2024-07-01 DIAGNOSIS — E538 Deficiency of other specified B group vitamins: Secondary | ICD-10-CM

## 2024-07-02 ENCOUNTER — Inpatient Hospital Stay: Attending: Hematology and Oncology

## 2024-07-02 DIAGNOSIS — E538 Deficiency of other specified B group vitamins: Secondary | ICD-10-CM | POA: Diagnosis present

## 2024-07-02 LAB — VITAMIN B12: Vitamin B-12: 420 pg/mL (ref 180–914)

## 2024-07-07 ENCOUNTER — Encounter: Payer: Self-pay | Admitting: Orthopaedic Surgery

## 2024-07-07 ENCOUNTER — Ambulatory Visit: Payer: Self-pay

## 2024-07-07 ENCOUNTER — Other Ambulatory Visit

## 2024-07-07 ENCOUNTER — Ambulatory Visit: Admitting: Orthopaedic Surgery

## 2024-07-07 VITALS — Ht 71.0 in | Wt 306.0 lb

## 2024-07-07 DIAGNOSIS — M25552 Pain in left hip: Secondary | ICD-10-CM

## 2024-07-07 DIAGNOSIS — M1612 Unilateral primary osteoarthritis, left hip: Secondary | ICD-10-CM | POA: Insufficient documentation
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
## 2024-07-11 ENCOUNTER — Encounter: Payer: Self-pay | Admitting: Hematology and Oncology

## 2024-07-14 ENCOUNTER — Ambulatory Visit (INDEPENDENT_AMBULATORY_CARE_PROVIDER_SITE_OTHER)

## 2024-07-14 ENCOUNTER — Other Ambulatory Visit (HOSPITAL_COMMUNITY): Payer: Self-pay

## 2024-07-14 ENCOUNTER — Encounter: Payer: Self-pay | Admitting: Hematology and Oncology

## 2024-07-14 DIAGNOSIS — E559 Vitamin D deficiency, unspecified: Secondary | ICD-10-CM

## 2024-07-14 DIAGNOSIS — G4733 Obstructive sleep apnea (adult) (pediatric): Secondary | ICD-10-CM | POA: Insufficient documentation

## 2024-07-14 DIAGNOSIS — M1612 Unilateral primary osteoarthritis, left hip: Secondary | ICD-10-CM

## 2024-07-14 DIAGNOSIS — R739 Hyperglycemia, unspecified: Secondary | ICD-10-CM

## 2024-07-14 DIAGNOSIS — K912 Postsurgical malabsorption, not elsewhere classified: Secondary | ICD-10-CM

## 2024-07-14 DIAGNOSIS — E538 Deficiency of other specified B group vitamins: Secondary | ICD-10-CM

## 2024-07-14 DIAGNOSIS — Z8759 Personal history of other complications of pregnancy, childbirth and the puerperium: Secondary | ICD-10-CM

## 2024-07-14 DIAGNOSIS — Z903 Acquired absence of stomach [part of]: Secondary | ICD-10-CM

## 2024-07-14 LAB — HEMOGLOBIN A1C: Hgb A1c MFr Bld: 5.3 % (ref 4.6–6.5)

## 2024-07-14 MED ORDER — TIRZEPATIDE-WEIGHT MANAGEMENT 2.5 MG/0.5ML ~~LOC~~ SOLN: 2.5000 mg | SUBCUTANEOUS | 0 refills | Status: DC

## 2024-07-14 MED ORDER — VITAMIN D 25 MCG (1000 UNIT) PO TABS: 1000.0000 [IU] | ORAL_TABLET | Freq: Every day | ORAL | 1 refills | Status: AC

## 2024-07-14 MED ORDER — NALTREXONE-BUPROPION HCL ER 8-90 MG PO TB12: ORAL_TABLET | ORAL | 0 refills | Status: DC
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
## 2024-07-14 NOTE — Patient Instructions (Addendum)
  VISIT SUMMARY: Today, we discussed your weight management, left hip arthritis, hypertension, and vitamin deficiencies. We have outlined a plan to address each of these issues, including starting new medications, monitoring your blood pressure, and continuing your vitamin supplements.  Get BP monitor from: [facility] [address] [place],  [place]  [address]   YOUR PLAN: OBESITY STATUS POST BARIATRIC SURGERY: You have regained weight since your bariatric surgery in 2019 and currently weigh 309 lbs. Your mobility is limited due to left hip arthritis, and you are at high risk for sleep apnea. -Start taking Contrave with a gradual dose increase as per instructions. Be aware of potential side effects like headaches, sleep disturbances, and gastrointestinal issues. -We will order a home sleep test to check for sleep apnea. -Discuss with your gastroenterologist about the possibility of revision surgery. -Consider alternative weight management strategies if insurance does not cover Contrave. -Stop taking labetalol . -Get a large or extra-large blood pressure cuff for accurate home monitoring. -Monitor your blood pressure daily for seven days and report the readings. -Start taking a vitamin D  supplement, 1000 units daily. -We will repeat your vitamin D  level in three months. -Continue your monthly B12 injections.                      Contains text generated by Abridge.                                 Contains text generated by Abridge.

## 2024-07-15 ENCOUNTER — Ambulatory Visit: Payer: Self-pay

## 2024-07-15 ENCOUNTER — Telehealth: Payer: Self-pay

## 2024-07-15 ENCOUNTER — Other Ambulatory Visit: Payer: Self-pay

## 2024-07-15 ENCOUNTER — Other Ambulatory Visit (HOSPITAL_COMMUNITY): Payer: Self-pay

## 2024-07-15 DIAGNOSIS — G4733 Obstructive sleep apnea (adult) (pediatric): Secondary | ICD-10-CM

## 2024-07-15 DIAGNOSIS — Z136 Encounter for screening for cardiovascular disorders: Secondary | ICD-10-CM

## 2024-07-15 NOTE — Progress Notes (Signed)
 Labs ordered.

## 2024-07-15 NOTE — Telephone Encounter (Signed)
 Hi, can we fax the Zepbound  prescription per pt's request to the pharmacy below:  [facility] [address], [place], [place] [address]

## 2024-07-15 NOTE — Telephone Encounter (Signed)
 I am trying to get Zepbound  approved for this patient, on the basis that she has a sleep study documented in the chart from 2019, results confirmed by Dr Buck from [facility] which diagnosed her with obstructive sleep apnea.  Can you assist in the prior authorization process? Routing to nursing in case this has been sent to the wrong prior authorization pool, if so, please correct the route to the appropriate prior authorization pool.  Thanks

## 2024-07-17 MED ORDER — TIRZEPATIDE-WEIGHT MANAGEMENT 2.5 MG/0.5ML ~~LOC~~ SOLN
2.5000 mg | SUBCUTANEOUS | 0 refills | Status: DC
Start: 2024-07-17 — End: 2024-08-06

## 2024-07-17 NOTE — Addendum Note (Signed)
 Addended by: KALLIE CLOTILDA SQUIBB on: 07/17/2024 10:42 AM   Modules accepted: Orders

## 2024-07-17 NOTE — Telephone Encounter (Signed)
 Rx sent and she has an appt in 1 month.

## 2024-07-30 ENCOUNTER — Ambulatory Visit

## 2024-08-04 ENCOUNTER — Encounter: Payer: Self-pay | Admitting: Radiology

## 2024-08-05 ENCOUNTER — Other Ambulatory Visit: Payer: Self-pay

## 2024-08-05 DIAGNOSIS — G4733 Obstructive sleep apnea (adult) (pediatric): Secondary | ICD-10-CM

## 2024-08-06 MED ORDER — TIRZEPATIDE-WEIGHT MANAGEMENT 5 MG/0.5ML ~~LOC~~ SOLN: 5.0000 mg | SUBCUTANEOUS | 0 refills | Status: DC

## 2024-08-13 ENCOUNTER — Ambulatory Visit: Payer: Self-pay

## 2024-08-13 ENCOUNTER — Other Ambulatory Visit (INDEPENDENT_AMBULATORY_CARE_PROVIDER_SITE_OTHER)

## 2024-08-13 DIAGNOSIS — Z136 Encounter for screening for cardiovascular disorders: Secondary | ICD-10-CM | POA: Diagnosis not present

## 2024-08-13 DIAGNOSIS — K912 Postsurgical malabsorption, not elsewhere classified: Secondary | ICD-10-CM

## 2024-08-13 DIAGNOSIS — E519 Thiamine deficiency, unspecified: Secondary | ICD-10-CM

## 2024-08-13 LAB — LIPID PANEL
Cholesterol: 117 mg/dL (ref 0–200)
HDL: 43.8 mg/dL (ref >39.00)
LDL Cholesterol: 64 mg/dL (ref 0–99)
NonHDL: 73.42
Total CHOL/HDL Ratio: 3
Triglycerides: 47 mg/dL (ref 0.0–149.0)
VLDL: 9.4 mg/dL (ref 0.0–40.0)

## 2024-08-17 LAB — VITAMIN B1: Vitamin B1 (Thiamine): <6 nmol/L — ABNORMAL LOW (ref 8–30)

## 2024-08-18 DIAGNOSIS — E519 Thiamine deficiency, unspecified: Secondary | ICD-10-CM | POA: Insufficient documentation

## 2024-08-18 MED ORDER — VITAMIN B-1 50 MG PO TABS: 50.0000 mg | ORAL_TABLET | Freq: Every day | ORAL | 3 refills | Status: AC

## 2024-08-18 NOTE — Progress Notes (Signed)
 Called pt and schedule appt for labs

## 2024-08-19 ENCOUNTER — Ambulatory Visit (INDEPENDENT_AMBULATORY_CARE_PROVIDER_SITE_OTHER)

## 2024-08-19 VITALS — BP 120/80 | HR 70 | Temp 97.7°F | Ht 70.0 in | Wt 299.0 lb

## 2024-08-19 DIAGNOSIS — Z0000 Encounter for general adult medical examination without abnormal findings: Secondary | ICD-10-CM

## 2024-08-19 DIAGNOSIS — F3289 Other specified depressive episodes: Secondary | ICD-10-CM

## 2024-08-19 DIAGNOSIS — E519 Thiamine deficiency, unspecified: Secondary | ICD-10-CM

## 2024-08-19 MED ORDER — FLUOXETINE HCL 20 MG PO CAPS: 20.0000 mg | ORAL_CAPSULE | Freq: Every day | ORAL | 0 refills | Status: AC

## 2024-08-19 NOTE — Progress Notes (Addendum)
 Assessment & Plan:   This visit was conducted in person. The patient gave informed consent to the use of Abridge AI technology to record the contents of the encounter as documented below.  Assessment & Plan Physical exam, annual I have personally reviewed and have noted: 1. The patient's medical and social history 2. Their use of alcohol, tobacco or illicit drugs 3. Their current medications and supplements 4. The patient's functional ability including ADL's, fall risks, home safety risks and hearing or visual impairment. 5. Diet and physical activities 6. Evidence for depression or mood disorder  Colonoscopy: N/A Mammogram: N/A Bone Density: N/A DEXA: N/A Pap: Reportedly in 2024 WNL, will check ROR Labs: HIV, Hep C, Lipid and A1c UTD Immunizations: Declined flu and COVID, Would like HPV but will check with insurance first. Diet and Exercise: Been walking with daughter a little, hard with hip, less snacking, improved potion size Sleep and mood: more irritable, SIGECAPS low gec,  Dental and vision:UTD  Depression Clinically relevant depression with low energy, irritability, and lack of motivation.  History of postpartum depression.  No suicidal ideation. Symptoms exacerbated by recent separation and stress. Open to medication.  Hopefully energy levels will improve with symptomatic treatment.  - Prescribed Prozac 20 mg daily. - Follow-up in 4 weeks to assess response. - Advised to monitor and report suicidal ideation. - Educated on Prozac side effects: GI discomfort, insomnia, drowsiness, sexual side effects. - Advised daytime dosing unless drowsiness occurs. - Instructed on tapering if discontinuing.  Thiamine  deficiency Low thiamine  levels identified, secondary to bariatric surgery. No current supplementation due to pharmacy cancellation she will pick up over-the-counter.  She should be on supplementation indefinitely.  - Continue thiamine  50 mg daily. - Repeat thiamine   levels in a few months.     Follow-up: Return in about 4 weeks (around 09/16/2024) for Mood and fatigue.        Subjective:   Patient ID: Carrie Shaw, female    DOB: 16-Aug-1986  Age: 38 y.o. MRN: [id number]  Patient Care Team: Bennett Reuben POUR, MD as PCP - General (Family Medicine) Rutherford Gain, MD as PCP - OBGYN (Obstetrics and Gynecology) Tobb, Kardie, DO as PCP - Cardiology (Cardiology) Tanda Locus, MD as Consulting Physician (General Surgery)   CC:  Chief Complaint  Patient presents with   Annual Exam    Under a lot of stress due to separation. Noticed increased irritability.      Carrie Shaw is a 38 y.o. female who presents today for a complete physical exam.   Discussed the use of AI scribe software for clinical note transcription with the patient, who gave verbal consent to proceed.  History of Present Illness Carrie Shaw is a 38 year old female who presents with low mood and fatigue.  She reports low mood and fatigue and is currently going through a separation from her husband. She feels irritated and exhausted despite sleeping through the night. Her fatigue is impacting her ability to concentrate on work, which she does from home. No anxiety, feelings of being on edge, or restlessness. No thoughts of self-harm.  She has a history of smoking but has quit completely. She reports a low thiamine  level and plans to start a supplement of 50 mg daily, as her previous prescription was canceled. She reports that her thiamine  (B1) level was found to be low.  Her diet has changed since starting medication, leading to less snacking and smaller portion sizes. She has difficulty eating large  meals and often takes a long time to finish her meals. She mentions eating a cucumber salad for lunch and dinner, sometimes with meat, but struggles to finish her meals.  She is not currently following with a gynecologist and her last Pap smear in 2024  was reportedly normal. She has no family history of colon or breast cancer and is not due for early screenings. She has a history of postpartum depression but does not recall being on any medication for it.  She is not currently sexually active and has not received the HPV vaccine in her younger years. She engages in minimal physical activity due to hip pain, primarily walking with her daughter. She recently painted her bathroom, which left her feeling worn out the next day.     Depression Screening;    08/19/2024   11:24 AM 07/14/2024   11:10 AM 07/04/2022    9:08 AM 01/06/2020    9:10 AM 08/29/2017   10:02 AM 05/17/2016    9:40 AM 02/04/2015    9:12 AM  PHQ 2/9 Scores  PHQ - 2 Score 2 1 2 1 2  0 0  PHQ- 9 Score 8  12  6  11         Data saved with a previous flowsheet row definition     Anxiety Screening:    08/19/2024   11:25 AM 08/29/2017   10:02 AM  GAD 7 : Generalized Anxiety Score  Nervous, Anxious, on Edge 2 2  Control/stop worrying 2 2  Worry too much - different things 2 1  Trouble relaxing 2 1  Restless 1 0  Easily annoyed or irritable 3 1  Afraid - awful might happen 0 2  Total GAD 7 Score 12 9  Anxiety Difficulty Very difficult      ROS: Negative unless specifically indicated above in HPI.       Objective:     BP 120/80 (BP Location: Right Arm, Patient Position: Sitting, Cuff Size: Large)   Pulse 70   Temp 97.7 F (36.5 C) (Oral)   Ht 5' 10 (1.778 m)   Wt 299 lb (135.6 kg)   LMP 08/13/2024 (Exact Date)   SpO2 98%   Breastfeeding No   BMI 42.90 kg/m    Physical Exam GENERAL: Alert, cooperative, well developed, no acute distress. Morbidly obese. HEAD: Normocephalic atraumatic. EYES: Extraocular movements intact bilaterally, pupils round, equal and reactive to light bilaterally, conjunctivae normal bilaterally. EARS: Tympanic membrane, ear canal and external ear normal bilaterally, external auditory canals clear bilaterally. NOSE: No congestion or  rhinorrhea, mucous membranes are moist. THROAT: Oral cavity with missing teeth, no oropharyngeal exudate or posterior oropharyngeal erythema. CARDIOVASCULAR: Normal heart rate and rhythm, S1 and S2 normal without murmurs. CHEST: Clear to auscultation bilaterally, no wheezes, rhonchi, or crackles. ABDOMEN: Soft, non tender, non distended, without organomegaly, normal bowel sounds. EXTREMITIES: No cyanosis or edema. NEUROLOGICAL: Oriented to person, place and time, no gait abnormalities, moves all extremities without gross motor or sensory deficit. NECK: Thyroid  non-tender, no thyromegaly.        Carrie Beg K Lukisha Procida, MD  08/19/24    Contains text generated by [employer].

## 2024-08-19 NOTE — Patient Instructions (Addendum)
 Thank you for visiting [facility] today! Here's what we talked about: - START Prozac 1 tablet daily - Check with insurance if HPV shot covered

## 2024-08-20 NOTE — Addendum Note (Signed)
 Addended by: BENNETT REUBEN POUR on: 08/20/2024 02:25 PM   Modules accepted: Level of Service

## 2024-09-01 ENCOUNTER — Other Ambulatory Visit: Payer: Self-pay

## 2024-09-01 NOTE — Telephone Encounter (Signed)
 Refill request from [facility] has been received and forwarded to Dr Charlyn Rx Refill Tab

## 2024-09-09 MED ORDER — ZEPBOUND 5 MG/0.5ML ~~LOC~~ SOLN: 5.0000 mg | SUBCUTANEOUS | 0 refills | Status: DC

## 2024-09-16 ENCOUNTER — Ambulatory Visit: Admitting: Family Medicine

## 2024-09-16 ENCOUNTER — Ambulatory Visit (INDEPENDENT_AMBULATORY_CARE_PROVIDER_SITE_OTHER)

## 2024-09-16 VITALS — BP 114/84 | HR 75 | Temp 98.1°F | Ht 70.0 in | Wt 287.0 lb

## 2024-09-16 DIAGNOSIS — F3289 Other specified depressive episodes: Secondary | ICD-10-CM

## 2024-09-16 MED ORDER — TIRZEPATIDE 7.5 MG/0.5ML ~~LOC~~ SOAJ: 7.5000 mg | SUBCUTANEOUS | 0 refills | Status: DC

## 2024-09-16 NOTE — Progress Notes (Signed)
 Subjective:   This visit was conducted in person. The patient gave informed consent to the use of Abridge AI technology to record the contents of the encounter as documented below.   Patient ID: Carrie Shaw, female    DOB: 02/10/1986, 38 y.o.   MRN: [id number]   Discussed the use of AI scribe software for clinical note transcription with the patient, who gave verbal consent to proceed.  History of Present Illness Carrie Shaw is a 38 year old female with depression and anxiety who presents for follow-up of her mood disorder management.  Her mood has improved with the use of Prozac , and she is more aware of her triggers and better able to avoid them. Scores on mood questionnaires are low for both depression and anxiety. She feels more motivated and is not experiencing feelings of wanting to 'lay around'.  Her sleep has improved significantly since starting medication. She takes her sleep medication right before bed and falls asleep within thirty minutes, staying asleep through the night. She reports sleeping from 11:30 PM to 6:30 or 7:00 AM and feels rested upon waking.  She is currently taking Prozac  and a vitamin B1 supplement, one tablet daily. She is also in the process of obtaining Zepbound , but there was an issue with the prescription dosage sent to the pharmacy.  She has been engaging in activities such as crocheting and shopping for mental wellness. Her usual activity of baking is on hold due to kitchen renovations.  She has been monitoring her blood pressure at home, noting occasional high readings around 149/80 mmHg.  No thoughts of self-harm.   Review of Systems  All other systems reviewed and are negative.       Allergies[1]  Medications Ordered Prior to Encounter[2]  BP 114/84 (BP Location: Left Arm, Patient Position: Sitting, Cuff Size: Large)   Pulse 75   Temp 98.1 F (36.7 C) (Oral)   Ht 5' 10 (1.778 m)   Wt 287 lb (130.2 kg)   LMP  09/07/2024 (Approximate)   SpO2 96%   BMI 41.18 kg/m   Objective:      Physical Exam GENERAL: Alert, cooperative, well developed, no acute distress. HEAD: Normocephalic atraumatic. EYES: Extraocular movements intact BL, pupils round, equal and reactive to light BL, conjunctivae normal BL. EXTREMITIES: No cyanosis or edema. NEUROLOGICAL: Oriented to person, place and time, no gait abnormalities, moves all extremities without gross motor or sensory deficit.   Flowsheet Row Office Visit from 09/16/2024 in [facility] [place]  PHQ-9 Total Score 1       09/16/2024    8:04 AM 08/19/2024   11:25 AM 08/29/2017   10:02 AM  GAD 7 : Generalized Anxiety Score  Nervous, Anxious, on Edge 0 2 2  Control/stop worrying 0 2 2  Worry too much - different things 0 2 1  Trouble relaxing 1 2 1   Restless 0 1 0  Easily annoyed or irritable 1 3 1   Afraid - awful might happen 0 0 2  Total GAD 7 Score 2 12 9   Anxiety Difficulty Not difficult at all Very difficult          Assessment & Plan:    Assessment & Plan Depression Improvement on Prozac  with low depression and anxiety scores today. Increased motivation and emotional regulation. No suicidal ideation. Open to CBT, sent referral.  - Continue Prozac  20 mg daily. - Referred to CBT weekly. - Encouraged personal mental wellness routine. - Follow-up  in 8 weeks to assess mood and medication efficacy.  Morbid obesity   Good weight loss and tolerance of Mounjaro , 12 pounds lost over the past month, will increase to 7.5 mg dosage. - Sent new prescription for Zepbound  7.5 mg to pharmacy.   Return in about 8 weeks (around 11/11/2024) for Mood.   Chon Buhl K Xyla Leisner, MD  09/16/2024     Contains text generated by Abridge.        [1] No Known Allergies [2]  Current Outpatient Medications on File Prior to Visit  Medication Sig Dispense Refill   cholecalciferol (VITAMIN D3) 25 MCG (1000 UNIT) tablet Take 1 tablet  (1,000 Units total) by mouth daily. 90 tablet 1   FLUoxetine  (PROZAC ) 20 MG capsule Take 1 capsule (20 mg total) by mouth daily. 90 capsule 0   Prenatal Vit-Fe Fumarate-FA (PRENATAL VITAMIN PO) Take by mouth daily in the afternoon.     thiamine  (VITAMIN B-1) 50 MG tablet Take 1 tablet (50 mg total) by mouth daily. 90 tablet 3   No current facility-administered medications on file prior to visit.

## 2024-09-16 NOTE — Patient Instructions (Signed)
 Thank you for visiting [facility] today! Here's what we talked about: - Call Therapy, continue prozac  - Continue mental wellness routine crochet, shopping

## 2024-09-19 NOTE — Telephone Encounter (Signed)
 Pls call [facility] and confirm specifically what the medication issue is. Thanks

## 2024-09-22 MED ORDER — TIRZEPATIDE-WEIGHT MANAGEMENT 7.5 MG/0.5ML ~~LOC~~ SOLN: 7.5000 mg | SUBCUTANEOUS | 0 refills | Status: DC

## 2024-09-22 NOTE — Telephone Encounter (Signed)
 The prescription was sent as Mounjaro  and not Zepbound . Once a new rx is received, they can send it out.

## 2024-10-01 ENCOUNTER — Ambulatory Visit: Payer: Self-pay

## 2024-10-01 NOTE — Telephone Encounter (Signed)
 FYI Only or Action Required?: FYI only for provider: pt declined appt.  Patient was last seen in primary care on 09/16/2024 by Bennett Reuben POUR, MD.  Called Nurse Triage reporting Blurred Vision.  Symptoms began today.  Interventions attempted: Nothing.  Symptoms are: completely resolved.  Triage Disposition: See PCP When Office is Open (Within 3 Days)  Patient/caregiver understands and will follow disposition?:   Copied from CRM #[id number]. Topic: Clinical - Red Word Triage >> Oct 01, 2024 11:52 AM Anairis L wrote: [facility] that prompted transfer to Nurse Triage: Jeralyn out of store and couldn't see(Blurry Vision), headache followed. Reason for Disposition  [1] Brief (now gone) blurred vision AND [2] unexplained  Answer Assessment - Initial Assessment Questions 1. DESCRIPTION: How has your vision changed? (e.g., complete vision loss, blurred vision, double vision, floaters, etc.)     Was just in [employer] and began having blurred vision in both eyes that has resolved lasting about 10 min 5. PATTERN: Does this come and go, or has it been constant since it started?      6. PAIN: Is there any pain in your eye(s)?  (Scale 1-10; or mild, moderate, severe)     Denies pain in eyes, now has HA 5/10 7. CONTACTS-GLASSES: Do you wear contacts or glasses?      8. CAUSE: What do you think is causing this visual problem?     unknown 9. OTHER SYMPTOMS: Do you have any other symptoms? (e.g., confusion, headache, arm or leg weakness, speech problems)     Denies weakness/ numbness anywhere.  10. PREGNANCY: Is there any chance you are pregnant? When was your last menstrual period?       no  Protocols used: Vision Loss or Change-A-AH

## 2024-10-06 ENCOUNTER — Ambulatory Visit: Admitting: Orthopaedic Surgery

## 2024-10-06 ENCOUNTER — Encounter: Payer: Self-pay | Admitting: Orthopaedic Surgery

## 2024-10-06 VITALS — Ht 70.0 in | Wt 287.0 lb

## 2024-10-06 DIAGNOSIS — M25552 Pain in left hip: Secondary | ICD-10-CM | POA: Diagnosis not present

## 2024-10-06 DIAGNOSIS — M1612 Unilateral primary osteoarthritis, left hip: Secondary | ICD-10-CM
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
## 2024-11-03 ENCOUNTER — Ambulatory Visit: Admitting: Orthopaedic Surgery

## 2024-11-03 MED ORDER — TIRZEPATIDE 10 MG/0.5ML ~~LOC~~ SOAJ: 10.0000 mg | SUBCUTANEOUS | 0 refills | Status: DC

## 2024-11-05 MED ORDER — TIRZEPATIDE-WEIGHT MANAGEMENT 10 MG/0.5ML ~~LOC~~ SOLN: 10.0000 mg | SUBCUTANEOUS | 0 refills | Status: AC

## 2024-11-05 NOTE — Addendum Note (Signed)
 Addended by: KALLIE CLOTILDA SQUIBB on: 11/05/2024 04:54 PM   Modules accepted: Orders

## 2024-11-11 ENCOUNTER — Ambulatory Visit

## 2024-11-18 ENCOUNTER — Other Ambulatory Visit

## 2024-11-26 ENCOUNTER — Ambulatory Visit: Admitting: Orthopaedic Surgery
